# Patient Record
Sex: Female | Born: 1935 | Hispanic: Yes | Marital: Single | Smoking: Never smoker
Health system: Southern US, Community
[De-identification: ages and names within clinical notes are randomized; demographics above are authoritative.]

## PROBLEM LIST (undated history)

## (undated) DIAGNOSIS — K219 Gastro-esophageal reflux disease without esophagitis: Secondary | ICD-10-CM

## (undated) DIAGNOSIS — C3490 Malignant neoplasm of unspecified part of unspecified bronchus or lung: Secondary | ICD-10-CM

## (undated) DIAGNOSIS — I10 Essential (primary) hypertension: Secondary | ICD-10-CM

## (undated) DIAGNOSIS — C189 Malignant neoplasm of colon, unspecified: Secondary | ICD-10-CM

## (undated) DIAGNOSIS — K56609 Unspecified intestinal obstruction, unspecified as to partial versus complete obstruction: Secondary | ICD-10-CM

## (undated) HISTORY — PX: COLON SURGERY: SHX602

## (undated) HISTORY — DX: Gastro-esophageal reflux disease without esophagitis: K21.9

## (undated) HISTORY — PX: COLOSTOMY: SHX63

## (undated) HISTORY — PX: OTHER SURGICAL HISTORY: SHX169

## (undated) HISTORY — DX: Unspecified intestinal obstruction, unspecified as to partial versus complete obstruction: K56.609

---

## 2009-10-23 ENCOUNTER — Emergency Department (HOSPITAL_COMMUNITY): Admission: EM | Admit: 2009-10-23 | Discharge: 2009-10-23 | Payer: Self-pay | Admitting: Family Medicine

## 2011-03-20 ENCOUNTER — Emergency Department (HOSPITAL_COMMUNITY)
Admission: EM | Admit: 2011-03-20 | Discharge: 2011-03-21 | Disposition: A | Discharge status: Home / Self Care | Payer: Medicaid Other | Attending: Emergency Medicine | Admitting: Emergency Medicine

## 2011-03-20 DIAGNOSIS — Z85038 Personal history of other malignant neoplasm of large intestine: Secondary | ICD-10-CM | POA: Insufficient documentation

## 2011-03-20 DIAGNOSIS — I10 Essential (primary) hypertension: Secondary | ICD-10-CM | POA: Insufficient documentation

## 2011-03-20 DIAGNOSIS — R42 Dizziness and giddiness: Secondary | ICD-10-CM | POA: Insufficient documentation

## 2011-03-21 ENCOUNTER — Inpatient Hospital Stay (INDEPENDENT_AMBULATORY_CARE_PROVIDER_SITE_OTHER)
Admission: RE | Admit: 2011-03-21 | Discharge: 2011-03-21 | Disposition: A | Discharge status: Home / Self Care | Payer: Medicaid Other | Source: Ambulatory Visit | Attending: Family Medicine | Admitting: Family Medicine

## 2011-03-21 ENCOUNTER — Encounter (HOSPITAL_COMMUNITY): Payer: Self-pay | Admitting: Radiology

## 2011-03-21 ENCOUNTER — Emergency Department (HOSPITAL_COMMUNITY): Payer: Medicaid Other

## 2011-03-21 ENCOUNTER — Observation Stay (HOSPITAL_COMMUNITY)
Admission: EM | Admit: 2011-03-21 | Discharge: 2011-03-22 | Disposition: A | Discharge status: Home / Self Care | Payer: Medicaid Other | Attending: Internal Medicine | Admitting: Internal Medicine

## 2011-03-21 DIAGNOSIS — R110 Nausea: Secondary | ICD-10-CM | POA: Insufficient documentation

## 2011-03-21 DIAGNOSIS — R51 Headache: Secondary | ICD-10-CM | POA: Insufficient documentation

## 2011-03-21 DIAGNOSIS — H814 Vertigo of central origin: Secondary | ICD-10-CM

## 2011-03-21 DIAGNOSIS — R42 Dizziness and giddiness: Principal | ICD-10-CM | POA: Insufficient documentation

## 2011-03-21 DIAGNOSIS — I10 Essential (primary) hypertension: Secondary | ICD-10-CM | POA: Insufficient documentation

## 2011-03-21 DIAGNOSIS — R4789 Other speech disturbances: Secondary | ICD-10-CM | POA: Insufficient documentation

## 2011-03-21 DIAGNOSIS — Z85038 Personal history of other malignant neoplasm of large intestine: Secondary | ICD-10-CM | POA: Insufficient documentation

## 2011-03-21 HISTORY — DX: Essential (primary) hypertension: I10

## 2011-03-21 HISTORY — DX: Malignant neoplasm of colon, unspecified: C18.9

## 2011-03-21 LAB — CBC
MCH: 30.1 pg (ref 26.0–34.0)
MCHC: 35.5 g/dL (ref 30.0–36.0)
Platelets: 256 10*3/uL (ref 150–400)
RBC: 4.38 MIL/uL (ref 3.87–5.11)
RDW: 13.2 % (ref 11.5–15.5)
WBC: 6.6 10*3/uL (ref 4.0–10.5)
WBC: 8.6 10*3/uL (ref 4.0–10.5)

## 2011-03-21 LAB — DIFFERENTIAL
Basophils Relative: 0 % (ref 0–1)
Basophils Relative: 0 % (ref 0–1)
Eosinophils Absolute: 0.1 10*3/uL (ref 0.0–0.7)
Eosinophils Absolute: 0.1 10*3/uL (ref 0.0–0.7)
Eosinophils Relative: 1 % (ref 0–5)
Eosinophils Relative: 1 % (ref 0–5)
Lymphocytes Relative: 17 % (ref 12–46)
Lymphs Abs: 0.8 10*3/uL (ref 0.7–4.0)
Lymphs Abs: 1.1 10*3/uL (ref 0.7–4.0)
Monocytes Relative: 5 % (ref 3–12)
Monocytes Relative: 5 % (ref 3–12)
Neutro Abs: 5.1 10*3/uL (ref 1.7–7.7)
Neutro Abs: 7.2 10*3/uL (ref 1.7–7.7)

## 2011-03-21 LAB — URINALYSIS, ROUTINE W REFLEX MICROSCOPIC
Glucose, UA: NEGATIVE mg/dL
Hgb urine dipstick: NEGATIVE
Ketones, ur: NEGATIVE mg/dL
Protein, ur: NEGATIVE mg/dL
Urobilinogen, UA: 0.2 mg/dL (ref 0.0–1.0)
pH: 7.5 (ref 5.0–8.0)

## 2011-03-21 LAB — BASIC METABOLIC PANEL
BUN: 11 mg/dL (ref 6–23)
CO2: 28 mEq/L (ref 19–32)
Chloride: 101 mEq/L (ref 96–112)
Chloride: 104 mEq/L (ref 96–112)
Creatinine, Ser: 0.66 mg/dL (ref 0.50–1.10)
GFR calc non Af Amer: >60 mL/min (ref >60)
Glucose, Bld: 180 mg/dL — ABNORMAL HIGH (ref 70–99)
Potassium: 3.8 mEq/L (ref 3.5–5.1)
Sodium: 139 mEq/L (ref 135–145)
Sodium: 141 mEq/L (ref 135–145)

## 2011-03-22 ENCOUNTER — Observation Stay (HOSPITAL_COMMUNITY): Payer: Medicaid Other

## 2011-03-23 NOTE — H&P (Signed)
  NAMEAIYA, KEACH NO.:  [id number]  MEDICAL RECORD NO.:  [id number]  LOCATION:                                 FACILITY:  PHYSICIAN:  Houston Siren, MD           DATE OF BIRTH:  1928-09-07  DATE OF ADMISSION: DATE OF DISCHARGE:                             HISTORY & PHYSICAL   PRIMARY CARE PHYSICIAN:  None.  REASON FOR ADMISSION:  Vertigo.  ADVANCE DIRECTIVE:  Full code.  HISTORY OF PRESENT ILLNESS:  This is an 77 year old Hispanic female with benign past medical history on no chronic medication, presents with 2- day history of vertigo and occasional occipital headaches.  She has no visual problem and according to her daughter, there was a transient slurred speech this morning as well.  It had resolved spontaneously. She denied any focal weakness.  She had some nausea yesterday but none today.  There has been no paresthesia, facial droop, nausea, vomiting, fever, or chills.  She did admit that the vertigo correlates a little bit with her head movement.  Evaluation in the emergency room included a negative head CT,a normal CBC, a negative urinalysis, and normal electrolytes with blood glucose of 106, creatinine 0.6.  Because of the concern that this might be central vertigo, hospitalist was asked to admit her for further workup.  PAST MEDICAL HISTORY:  Benign.  Colon cancer and hypertension.  MEDICATION:  On no chronic meds.  ALLERGIES:  No known drug allergies.  SOCIAL HISTORY:  She denied tobacco, alcohol, or drug use.  PHYSICAL EXAMINATION:  VITAL SIGNS:  Blood pressure 143/76, pulse is 66, respiratory rate 16, temperature 98.7 GENERAL:  She is alert and oriented, is in no apparent distress.  The speech is fluent. HEENT:  Tongue is midline.  She has facial symmetry.  Funduscopic exam is benign.  Pupils equal, round, reactive to light. NECK:  Supple.  No carotid bruit. CARDIAC:  S1, S2 regular. LUNGS:  Clear. ABDOMEN:  Soft, nondistended,  nontender. EXTREMITIES:  No edema.  Good distal pulses bilaterally. NEUROLOGIC:  Cerebellar exam is negative.  Strength equal. PSYCHIATRIC:  Unremarkable as well.  OBJECTIVE FINDINGS:  EKG:  Sinus rhythm at 58.  No acute ST-T changes. CT scan of the head is negative.  IMPRESSION:  This is an 77 year old female, presents with vertigo that is positional and is improving.  She also has some nausea, and I suspect that this is likely peripheral vertigo.  Having said that, the nausea is mild, and the fact that she had slurred speech is unusual.  I believe that it is reasonable to get an MRI, MRA of her brain to exclude central vertigo i.e. vertebral basilar insufficiency.  We will treat her with an aspirin, and we will use benzodiazepines to treat her symptoms.  She is otherwise stable and will be admitted to [facility] 1.  She is a full code.     Houston Siren, MD     PL/MEDQ  D:  03/21/2011  T:  03/21/2011  Job:  [id number]  Electronically Signed by Houston Siren  on 03/23/2011 01:42:27 AM

## 2011-04-11 NOTE — Discharge Summary (Signed)
Ashley Green, Ashley Green                ACCOUNT NO.:  [id number]  MEDICAL RECORD NO.:  [id number]  LOCATION:  3705                         FACILITY:  [facility]  PHYSICIAN:  Calvert Cantor, M.D.     DATE OF BIRTH:  [birth year]  DATE OF ADMISSION:  03/21/2011 DATE OF DISCHARGE:  03/22/2011                              DISCHARGE SUMMARY   PRIMARY CARE PHYSICIAN:  None.  PRESENTING COMPLAINT:  Dizziness.  HISTORY OF PRESENT ILLNESS:  This is an 77 year old female with no significant past medical history who came in with a complaint of 2 days of dizziness and occasional headaches in the back of her head. Apparently, there was also transient episode of slurred speech the morning of admission, all this resolved completely.  She did not have any further new neurological symptoms.  Neurological exam in the ER no abnormal findings were noted.  It was suspected that the patient's dizziness may be coming from either labyrinthitis or BPPV.  However, an MRI and MRA were done to rule out CVA.  An MRI, MRA performed March 22, 2011, revealed no acute infarct.  She has mild to slightly moderate small vessel disease-type ischemic changes. Also noted is a moderate focal stenosis in proximal A1 segment right anterior cerebral artery.  Middle cerebral artery and anterior cerebral artery reveal mild branch irregularity.  There is an ectatic dominant right vertebral artery.  There is nonvisualization of the right PICA, there is a small irregular appearance of the left vertebral artery.  It is suspected  there superimposed atherosclerotic changes present there nonvisualization of left PICA.  There was an ectatic basilar artery without high-grade stenosis.  There is fetal origin of the right posterior cerebral artery.  The patient also had a CT of her head in the ER without contrast which did not reveal any acute abnormality.  On questioning today, the patient states that her dizziness has resolved completely.   She has been walking back and forth to the bathroom without any trouble.  Upon ambulating in the hall the patient has not noted to have any dizziness or any gait disturbance.  Due to the patient's atherosclerosis noted on MRI, she is recommended to start a baby aspirin daily.  She is also advised to find a primary care physician to have a cholesterol panel checked.  If elevated, she should be on a statin.  DISCHARGE MEDICATIONS: 1. Aspirin 81 mg daily. 2. Meclizine 12.5 mg every 8 hours as needed for any further     dizziness.  FOLLOWUP INSTRUCTIONS:  Find a PCP and have basic blood work done.  PHYSICAL EXAMINATION:  NEUROLOGIC:  Cranial nerves II through XII intact.  Strength intact in all four extremities.  No slurred speech noted. HEART:  Regular rate and rhythm.  No murmurs. LUNGS:  Clear bilaterally.  Normal respiratory effort.  No use of accessory muscles. ABDOMEN:  Soft, nontender, nondistended.  Bowel sounds positive. EXTREMITIES:  No cyanosis, clubbing or edema.  Pedal pulse is positive.  CONDITION ON DISCHARGE:  Stable.  TIME ON DISCHARGE AND PATIENT CARE:  45 minutes.     Calvert Cantor, M.D.     SR/MEDQ  D:  03/22/2011  T:  03/23/2011  Job:  [id number]  Electronically Signed by Calvert Cantor M.D. on 04/11/2011 01:28:08 PM

## 2012-07-31 ENCOUNTER — Ambulatory Visit (INDEPENDENT_AMBULATORY_CARE_PROVIDER_SITE_OTHER): Payer: Medicaid Other | Admitting: Family Medicine

## 2012-07-31 ENCOUNTER — Encounter: Payer: Self-pay | Admitting: Family Medicine

## 2012-07-31 VITALS — BP 150/88 | HR 71 | Temp 97.6°F | Wt 110.0 lb

## 2012-07-31 DIAGNOSIS — I10 Essential (primary) hypertension: Secondary | ICD-10-CM

## 2012-07-31 DIAGNOSIS — Z85038 Personal history of other malignant neoplasm of large intestine: Secondary | ICD-10-CM

## 2012-07-31 LAB — COMPREHENSIVE METABOLIC PANEL
Calcium: 10.3 mg/dL (ref 8.4–10.5)
Chloride: 103 mEq/L (ref 96–112)
Sodium: 140 mEq/L (ref 135–145)

## 2012-07-31 LAB — LIPID PANEL
Cholesterol: 279 mg/dL — ABNORMAL HIGH (ref 0–200)
HDL: 70 mg/dL (ref >39)
Total CHOL/HDL Ratio: 4 Ratio
Triglycerides: 89 mg/dL (ref <150)

## 2012-07-31 LAB — CBC
MCH: 29.5 pg (ref 26.0–34.0)
MCHC: 34.7 g/dL (ref 30.0–36.0)
Platelets: 306 10*3/uL (ref 150–400)
RDW: 15 % (ref 11.5–15.5)
WBC: 6.1 10*3/uL (ref 4.0–10.5)

## 2012-07-31 NOTE — Progress Notes (Signed)
Subjective:     Patient ID: Ashley Green, female   DOB: August 22, 1928, 78 y.o.   MRN: [id number]  HPI Ashley Green is a 78 yo female with a past medical history of colon cancer s/p colectomy with colostomy placed, presenting to the office to establish care. Besides going to the ED once last year, she has not seen a primary doctor in 4 years. She c/o dry throat with intense thirst and polyuria but not frequency or dysuria for months.  She also has a tic that she has had for years, in which her right eye winks spontaneously also complains of right eye with dry sensation. She denies any other issues. She is currently changing and taking care of her colostomy bag herself. She is also staying busy, working in her [employer].   Review of Systems See HPI.    Objective:   Physical Exam BP 160/88  Pulse 71  Temp 97.6 F (36.4 C) (Oral)  Wt 110 lb (49.896 kg) General appearance: alert, cooperative and no distress HENT Normocephalic, without obvious abnormality Eyes: Conjunctivae and sclerae normal, EOM intact. PERRL. Moist mucosa.  Lungs: clear to auscultation bilaterally, no wheezing, rales, rhonchi Heart: regular rate and rhythm, S1, S2 normal, no murmur, click, rub or gallop Abdomen: soft, non tender or distended. normal BS, colostomy bag empty, clean and intact, no erythema or drainage of skin surrounding bag Extremities: extremities normal, atraumatic, no cyanosis or edema Pulses: 2+ and symmetric Skin: Skin color, texture, turgor normal. No rashes or lesions Neuro: Oriented x3. No apparent neurologic focalization. CN II-XII intact.  Psych: Normal mood and affect. Normal speech. SIGE CAPS negative.    PGY-2 Addendum. I have seen and examined this pt in conjunction with MS and agree with her assessment and plan. I also agree with above documentation and my observations are added in bold. Complete assessment and plan will be under each problem as below.  Assessment and Plan:

## 2012-07-31 NOTE — Assessment & Plan Note (Signed)
Reports taking HCTZ at a "low dose"  (?). No medical care for 4 years. Also reports polydipsia and polyuria without frequency or dysuria. Plan: - BP log and bring it to next appointment with medications she is currently taking - CBC, CMET, Lipid profile. If elevated glucose will consider A1C. - F/u in 4-6 weeks.

## 2012-07-31 NOTE — Patient Instructions (Addendum)
Ha sido un placer verle hoy. Por favor registre sus presiones y anotelas. Traiga esos resultados a su proxima cita, asi como todal las medicinas que esta tomando. Yo la llamo si los resultados de laboratorios que se le van a hacer hoy salen alterados. Haga una cita en 4-6 semanas.

## 2012-09-07 ENCOUNTER — Ambulatory Visit: Payer: Medicaid Other | Admitting: Family Medicine

## 2012-10-15 ENCOUNTER — Ambulatory Visit (INDEPENDENT_AMBULATORY_CARE_PROVIDER_SITE_OTHER): Payer: Medicaid Other | Admitting: Family Medicine

## 2012-10-15 ENCOUNTER — Encounter: Payer: Self-pay | Admitting: Family Medicine

## 2012-10-15 VITALS — BP 132/88 | HR 74 | Temp 98.1°F | Ht 59.0 in | Wt 109.1 lb

## 2012-10-15 DIAGNOSIS — I10 Essential (primary) hypertension: Secondary | ICD-10-CM

## 2012-10-15 DIAGNOSIS — K219 Gastro-esophageal reflux disease without esophagitis: Secondary | ICD-10-CM

## 2012-10-15 DIAGNOSIS — E785 Hyperlipidemia, unspecified: Secondary | ICD-10-CM

## 2012-10-15 HISTORY — DX: Gastro-esophageal reflux disease without esophagitis: K21.9

## 2012-10-15 MED ORDER — ATORVASTATIN CALCIUM 40 MG PO TABS: 40.0000 mg | ORAL_TABLET | Freq: Every day | ORAL | Status: DC

## 2012-10-15 MED ORDER — OMEPRAZOLE 40 MG PO CPDR: 40.0000 mg | DELAYED_RELEASE_CAPSULE | Freq: Every day | ORAL | Status: AC

## 2012-10-15 MED ORDER — LISINOPRIL 10 MG PO TABS: 10.0000 mg | ORAL_TABLET | Freq: Every day | ORAL | Status: DC

## 2012-10-15 NOTE — Patient Instructions (Addendum)
Hipercolesterolemia Colesterol en sangre elevado (Hypercholesterolemia, High Blood Cholesterol) Tiene el colesterol en sangre alto. El resultado de la prueba de colesterol en sangre de hoy es _________________ mg/dL. El colesterol es una molcula de grasa normal necesaria para su cuerpo. Una cifra entre 200 y 240mg /dl est por encima de lo normal. Es un factor de alto riesgo para enfermedad coronaria y ataques cardacos. El colesterol suele medirse en HDL (Lipoprotenas de alta densidad). La lipoprotena de alta densidad es el colesterol "bueno". Un alto nivel de HDL es bueno para usted. Disminuye el riesgo de sufrir ataques cardacos. Tambin se miden las LDL (Lipoprotena de baja densidad). Una cifra de LDL de entre 120 y -160 mg/dl es malo para usted. La mitad de [employer] tienen ataques cardacos o infartos tienen altos niveles de [place].  Bajar el colesterol alto reduce el riesgo de tener un ataque cardaco. OTRAS MEDIDAS QUE REDUCEN LOS ATAQUES CARDACOS PUEDEN INCLUIR:  Mantener el peso normal.  Abandonar el hbito de fumar.  Tomar una aspirina por da. Si tiene [employer], haga esto slo si el mdico lo aprueba.  Consuma alcohol a niveles aceptables.  Una dieta con bajo contenido de grasas y colesterol.  Realizar actividad fsica con regularidad.  Los medicamentos para Copy colesterol pueden ser tiles si ha sido sugeridas por el mdico. El profesional comentar con usted el tipo de tratamiento a Design[employer]ery. Concerte la realizacin de otro anlisis como se le ha indicado. EST SEGURO QUE:   Comprende las instrucciones para el alta mdica.  Controlar su enfermedad.  Solicitar atencin mdica de inmediato segn las indicaciones. Document Released: 09/09/2005 Document Revised: 12/02/2011 [facility] Patient Information 2013 [place], [place].

## 2012-10-16 MED ORDER — ASPIRIN 81 MG PO TABS: 81.0000 mg | ORAL_TABLET | Freq: Every day | ORAL | Status: DC

## 2012-10-16 NOTE — Assessment & Plan Note (Signed)
Takes Omeprazole for symptom control.

## 2012-10-16 NOTE — Assessment & Plan Note (Signed)
LDL 191. Goal for pt 160.  Plan: Discussed start on statin therapy and pt agreeable.  Will recheck in 6 months.

## 2012-10-16 NOTE — Progress Notes (Signed)
Family Medicine Office Visit Note   Subjective:   Patient ID: Ashley Green, female  DOB: 1928/07/29, 79 y.o.. MRN: [id number]   Pt that comes today to discuss recent labs and for refill on her meds. She comes accompanied by daughter and denies any current complaints. She continues to keep herself active working on a [employer].   Review of Systems:  Pt denies SOB, chest pain, palpitations, headaches, dizziness, numbness or weakness. No changes on urinary or BM habits. No unintentional weigh loss/gain.  Objective:   Physical Exam: Gen:  NAD HEENT: Moist mucous membranes CV: Regular rate and rhythm, no murmurs rubs or gallops PULM: Clear to auscultation bilaterally. No wheezes/rales/rhonchi ABD: Soft, non tender, non distended, normal bowel sounds. Colostomy bag present with no leaks or erythema on surrounded skin. EXT: No edema Neuro: Alert and oriented x3. No focalization  Assessment & Plan:

## 2012-10-16 NOTE — Assessment & Plan Note (Signed)
BP controlled on Lisinopril 40 mg.  Continue treatment and recommended ASA 81 mg daily.

## 2013-02-24 ENCOUNTER — Ambulatory Visit (HOSPITAL_COMMUNITY)
Admission: RE | Admit: 2013-02-24 | Discharge: 2013-02-24 | Disposition: A | Discharge status: Home / Self Care | Payer: Medicaid Other | Source: Ambulatory Visit | Attending: Family Medicine | Admitting: Family Medicine

## 2013-02-24 ENCOUNTER — Ambulatory Visit (INDEPENDENT_AMBULATORY_CARE_PROVIDER_SITE_OTHER): Payer: Medicaid Other | Admitting: Family Medicine

## 2013-02-24 ENCOUNTER — Encounter: Payer: Self-pay | Admitting: Family Medicine

## 2013-02-24 VITALS — BP 132/50 | HR 67 | Temp 99.6°F | Ht 59.0 in | Wt 107.3 lb

## 2013-02-24 DIAGNOSIS — R42 Dizziness and giddiness: Secondary | ICD-10-CM

## 2013-02-24 DIAGNOSIS — Z85038 Personal history of other malignant neoplasm of large intestine: Secondary | ICD-10-CM

## 2013-02-24 DIAGNOSIS — R9431 Abnormal electrocardiogram [ECG] [EKG]: Secondary | ICD-10-CM | POA: Insufficient documentation

## 2013-02-24 DIAGNOSIS — I10 Essential (primary) hypertension: Secondary | ICD-10-CM

## 2013-02-24 MED ORDER — NATURA STOMAHESIVE MOLDABLE WAFR: 1.0000 | WAFER | Freq: Two times a day (BID) | Status: DC

## 2013-02-24 NOTE — Progress Notes (Signed)
Family Medicine Office Visit Note   Subjective:   Patient ID: Ashley Green, female  DOB: 10/28/1927, 79 y.o.. MRN: [id number]   Pt that comes today complaining of dizziness when changing position from sitting to standing and from lying to sitting. She is taking 10 mg of Lisinopril and reports compliance.  Her BP today is 132/50 and same manual. Pt also reports palpitation with this dizzy spells, but denies chest pain or SOB. No orthopnea or leg swelling.  Her daughter mentions to [place] that she saw her one time in the morning bluish decoloration on  left posterior aspect of the tongue and lower lip that self resolved. No other episodes has been witnessed. Pt denies any symptoms when this happened.  Review of Systems:  Pt denies headaches, numbness or weakness. No changes on urinary or BM habits. No unintentional weigh loss/gain.  Objective:   Physical Exam: Gen: NAD  HEENT: Moist mucous membranes  CV: Regular rate and rhythm, no murmurs rubs or gallops  PULM: Clear to auscultation bilaterally. No wheezes/rales/rhonchi  ABD: Soft, non tender, non distended, normal bowel sounds. Colostomy bag present with no leaks or erythema on surrounded skin.  EXT: No edema  Neuro: Alert and oriented x3. No focalization  Assessment & Plan:

## 2013-02-24 NOTE — Patient Instructions (Addendum)
Pare de tomar el Lisinopril. Chequee su presion y sitien cifras por encima de 140/90 tome la mitad de la tableta. Por favor escriba en un papel los valores de presion obtenidos. Ashley Green cite en 2-3 semanas para re-evaluar su condicion.

## 2013-02-24 NOTE — Assessment & Plan Note (Signed)
Colostomy bags ordered. 

## 2013-02-24 NOTE — Assessment & Plan Note (Addendum)
Very low diastolic blood pressure on automatic and when rechecked manually.  Possible orthostatism can explain this dizziness. No murmur on physical exam. EKG with T abnormalities only on one lead, no ST changes. The episode of reported cyanosis does not seem clinically correlated to central cyanosis but still a concern. P/ Stop lisinopril (pt was only on 10 mg daily) BP log If symptoms continue will consider Holter Monitor and ECHO. F/u in 2-3 weeks.

## 2013-03-01 ENCOUNTER — Telehealth: Payer: Self-pay | Admitting: Family Medicine

## 2013-03-01 NOTE — Telephone Encounter (Signed)
FYI:  I also called [facility] and they are unable to order that size ostomy wafer.  Jakoby Melendrez, Darlyne Russian, CMA

## 2013-03-01 NOTE — Telephone Encounter (Signed)
The patient daughter is calling because [facility] informed her that they cannot supply the Ostomy supplies and that Dr. Aviva Signs will need to contact a Medical Supply Company.

## 2013-03-01 NOTE — Telephone Encounter (Signed)
Spoke with [facility] and they do not carry the Ostomy supplies.  They recommended trying [facility].  I called them and they do not carry that size.  Will fwd to PCP to request she print out Rx and patient can call and find a place that carries that size.  Imari Reen, Darlyne Russian, CMA

## 2013-03-02 NOTE — Telephone Encounter (Signed)
FYI:  Ashley [facility] does not carry ostomy supplies.  I called [facility] [phone]) and they suggested patient come to the store and they will help her figure out what she needs in the brand that they carry.  I notified patients daughter and once all is determined, if needed we can write a prescription for exactly what supply her mother needs at [employer] [employer].  Kierra Jezewski, Darlyne Russian, CMA

## 2013-07-19 ENCOUNTER — Encounter (HOSPITAL_COMMUNITY): Payer: Self-pay | Admitting: Emergency Medicine

## 2013-07-19 DIAGNOSIS — Z85038 Personal history of other malignant neoplasm of large intestine: Secondary | ICD-10-CM | POA: Insufficient documentation

## 2013-07-19 DIAGNOSIS — Z933 Colostomy status: Secondary | ICD-10-CM | POA: Insufficient documentation

## 2013-07-19 DIAGNOSIS — R1032 Left lower quadrant pain: Secondary | ICD-10-CM | POA: Insufficient documentation

## 2013-07-19 DIAGNOSIS — Z79899 Other long term (current) drug therapy: Secondary | ICD-10-CM | POA: Insufficient documentation

## 2013-07-19 DIAGNOSIS — Z7982 Long term (current) use of aspirin: Secondary | ICD-10-CM | POA: Insufficient documentation

## 2013-07-19 DIAGNOSIS — I10 Essential (primary) hypertension: Secondary | ICD-10-CM | POA: Insufficient documentation

## 2013-07-19 LAB — CBC WITH DIFFERENTIAL/PLATELET
Basophils Absolute: 0 10*3/uL (ref 0.0–0.1)
HCT: 36.9 % (ref 36.0–46.0)
Hemoglobin: 13.1 g/dL (ref 12.0–15.0)
Lymphocytes Relative: 7 % — ABNORMAL LOW (ref 12–46)
Lymphs Abs: 0.7 10*3/uL (ref 0.7–4.0)
Monocytes Relative: 5 % (ref 3–12)
Neutro Abs: 9 10*3/uL — ABNORMAL HIGH (ref 1.7–7.7)
Neutrophils Relative %: 87 % — ABNORMAL HIGH (ref 43–77)
Platelets: 299 10*3/uL (ref 150–400)
RBC: 4.31 MIL/uL (ref 3.87–5.11)
RDW: 13 % (ref 11.5–15.5)

## 2013-07-19 LAB — URINE MICROSCOPIC-ADD ON

## 2013-07-19 LAB — COMPREHENSIVE METABOLIC PANEL
ALT: 12 U/L (ref 0–35)
Alkaline Phosphatase: 73 U/L (ref 39–117)
CO2: 27 mEq/L (ref 19–32)
GFR calc Af Amer: >90 mL/min (ref >90)
Glucose, Bld: 140 mg/dL — ABNORMAL HIGH (ref 70–99)

## 2013-07-19 LAB — URINALYSIS, ROUTINE W REFLEX MICROSCOPIC
Glucose, UA: NEGATIVE mg/dL
Nitrite: NEGATIVE
Protein, ur: NEGATIVE mg/dL
Specific Gravity, Urine: 1.008 (ref 1.005–1.030)
pH: 6 (ref 5.0–8.0)

## 2013-07-19 NOTE — ED Notes (Addendum)
Pt. reports intermittent LLQ pain onset yesterday , denies nausea or vomitting , no fever or chills. Normal bowel movent at colostomy. No pain at triage.

## 2013-07-20 ENCOUNTER — Ambulatory Visit (INDEPENDENT_AMBULATORY_CARE_PROVIDER_SITE_OTHER): Payer: Medicaid Other | Admitting: Sports Medicine

## 2013-07-20 ENCOUNTER — Emergency Department (HOSPITAL_COMMUNITY)
Admission: EM | Admit: 2013-07-20 | Discharge: 2013-07-20 | Discharge status: Against Medical Advice | Payer: Medicaid Other | Attending: Emergency Medicine | Admitting: Emergency Medicine

## 2013-07-20 ENCOUNTER — Encounter: Payer: Self-pay | Admitting: Sports Medicine

## 2013-07-20 VITALS — BP 119/43 | HR 68 | Temp 99.3°F | Ht 60.0 in | Wt 102.0 lb

## 2013-07-20 DIAGNOSIS — R109 Unspecified abdominal pain: Secondary | ICD-10-CM

## 2013-07-20 LAB — POCT URINALYSIS DIPSTICK
Bilirubin, UA: NEGATIVE
Blood, UA: NEGATIVE
Leukocytes, UA: NEGATIVE
Protein, UA: NEGATIVE
Spec Grav, UA: 1.01
Urobilinogen, UA: 0.2

## 2013-07-20 MED ORDER — SENNA 8.6 MG PO TABS: 1.0000 | ORAL_TABLET | Freq: Three times a day (TID) | ORAL | Status: DC | PRN

## 2013-07-20 MED ORDER — POLYETHYLENE GLYCOL 3350 17 GM/SCOOP PO POWD: 17.0000 g | Freq: Two times a day (BID) | ORAL | Status: DC

## 2013-07-20 NOTE — ED Notes (Signed)
Pt left to she her PCP. I let her know she was next to get room, but she would not stay

## 2013-07-20 NOTE — Patient Instructions (Signed)
   Miralax 2 Scoops 2 Times a day for 2 days.  Drink Gatorade as well with this  Take Senna every 8 hours until a large BM occurs  Follow up with Dr.  Aviva Signs ASAP  We are sending an Ostomy Nurse to your house   If you need anything prior to your next visit please call the clinic. Please Bring all medications or accurate medication list with you to each appointment; an accurate medication list is essential in providing you the best care possible.

## 2013-07-20 NOTE — Progress Notes (Signed)
[facility] Ashley Green - 79 y.o. female MRN [id number]  Date of birth: [birth year]  CC, HPI, INTERVAL HISTORY & ROS  Ashley Green is here today for evaluation of diffuse abdominal pain.    She reports a 2 week insidious onset of abdominal discomfort and colicky type pain.  She is here with her daughter who interprets for her.  The patient and her daughter presented to the [facility] emergency department last night due to the symptoms however left prior to being seen because of spontaneous improvement.  Urinalysis at that time showed moderate leukocytes, negative nitrites, and unremarkable CBC and CMET.  The patient is status post colectomy with colostomy for colon cancer.  She reports over the past 2 weeks she has had significantly decreased stool output.  She is only having a hard occasional small balls of stool from her colostomy.  They have had significant issues with obtaining appropriate colostomy supplies and currently there is only a colostomy bag over the site without appropriate gasket/colostomy seal.    The daughter believes her mother has been significantly restricting fluid and magnesium citrate due to being concerned about ostomy leakage  Pt denies any fevers, chills, or rigors.  Patient denies any dysuria, frequency, hesitancy, nocturia.  No nausea, vomiting  No melana/hematachezia  History  Past Medical, Surgical, Social, and Family History Reviewed per EMR Medications and Allergies reviewed and all updated if necessary. Objective Findings  VITALS: HR: 68 bpm  BP: 119/43 mmHg  TEMP: 99.3 F (37.4 C) (Oral)  RESP:    HT: 5' (152.4 cm)  WT: 102 lb (46.267 kg)  BMI: 20   BP Readings from Last 3 Encounters:  07/20/13 119/43  07/19/13 172/71  02/24/13 132/50   Wt Readings from Last 3 Encounters:  07/20/13 102 lb (46.267 kg)  07/19/13 104 lb 12.8 oz (47.537 kg)  02/24/13 107 lb 4.8 oz (48.671 kg)     PHYSICAL EXAM: GENERAL:  adult thin  Hispanic elderly female. In no discomfort; no respiratory distress  PSYCH: alert and appropriate, daughter is used as an interpreter per patient request   HNEENT:  moist mucous membranes,   CARDIO: RRR, S1/S2 heard, no murmur  LUNGS: CTA B, no wheezes, no crackles  ABDOMEN: soft, diffusely tender with voluntary guarding, no rebound, no rigidity, negative Murphy's, negative McBurney's, there is a well-healed ostomy that is pink and moist, there is a palpable stool burden diffusely and there is no output from the ostomy at this time even with manipulation during deep abdominal palpation, There is no suprapubic tenderness   EXTREM:  Warm, well perfused.  Moves all 4 extremities spontaneously; no lateralization.  No noted foot lesions.  Distal pulses 2+/4.  No pretibial edema.  GU:  deferred   SKIN:     Assessment & Plan   Problems addressed today: General Plan & Pt Instructions:  1. Abdominal pain, unspecified site       Miralax 2 Scoops 2 Times a day for 2 days.  Drink Gatorade as well with this  Take Senna every 8 hours until a large BM occurs  Follow up with Dr.  Aviva Signs ASAP  We are sending an Ostomy Nurse to your house     For further discussion of A/P and for follow up issues see problem based charting if applicable.

## 2013-07-20 NOTE — Assessment & Plan Note (Addendum)
Patient's history significant for colostomy secondary to colon cancer. She has had multiple issues with recurrent abdominal pain and noted to have constipation in the past. History is consistent with constipation again.  The likely bigger issue here is likely lack of colostomy supplies and desire to avoid seepage of stool.  She is apparently had restrictive behaviors to avoid loose stools and reports significantly hard balls of stool over the past couple of weeks.  She has a palpable stool burden on exam without focal findings of infection.   - Referral placed for home health for colostomy supplies and colostomy wound care.   - Start MiraLax twice a day.  Stimulant laxative for the next 2 days. Will likely need to continue MiraLax daily > Consider checking TSH if not improved at next visit > Consider Foley soap suds enema if persistent symptoms.  Consider further imaging  Urinalysis with leukocytes but no nitrites, no bacteria.  Will recheck today and send for culture.  I had a low suspicion for significant intra-abdominal pathology but have reviewed red flags with the daughter and patient for reevaluation in the emergency department.  Of concern could be a reoccurrence of her colon cancer and recommend the above.  According to national recommendations the patient should likely undergo a repeat colonoscopy and/or consider CEA screening.  I have asked them to follow up with her primary care physician [facility]) to discuss further options. > Consider referral to general surgery/gastroenterology for repeat colonoscopy given lack of followup of her prior colon cancer since being in [place].

## 2013-07-21 LAB — URINE CULTURE: Organism ID, Bacteria: NO GROWTH

## 2013-11-02 ENCOUNTER — Other Ambulatory Visit: Payer: Self-pay | Admitting: *Deleted

## 2013-11-02 DIAGNOSIS — I10 Essential (primary) hypertension: Secondary | ICD-10-CM

## 2013-11-02 MED ORDER — LISINOPRIL 10 MG PO TABS: 10.0000 mg | ORAL_TABLET | Freq: Every day | ORAL | Status: DC

## 2014-01-27 ENCOUNTER — Ambulatory Visit (INDEPENDENT_AMBULATORY_CARE_PROVIDER_SITE_OTHER): Payer: Medicaid Other | Admitting: Family Medicine

## 2014-01-27 ENCOUNTER — Encounter: Payer: Self-pay | Admitting: Family Medicine

## 2014-01-27 VITALS — BP 110/43 | HR 75 | Temp 98.1°F | Ht 60.0 in | Wt 104.0 lb

## 2014-01-27 DIAGNOSIS — I10 Essential (primary) hypertension: Secondary | ICD-10-CM

## 2014-01-27 DIAGNOSIS — R42 Dizziness and giddiness: Secondary | ICD-10-CM

## 2014-01-27 LAB — CBC WITH DIFFERENTIAL/PLATELET
BASOS ABS: 0 10*3/uL (ref 0.0–0.1)
Basophils Relative: 0 % (ref 0–1)
EOS PCT: 2 % (ref 0–5)
Eosinophils Absolute: 0.1 10*3/uL (ref 0.0–0.7)
HCT: 35.4 % — ABNORMAL LOW (ref 36.0–46.0)
Hemoglobin: 12.1 g/dL (ref 12.0–15.0)
LYMPHS ABS: 0.7 10*3/uL (ref 0.7–4.0)
Lymphocytes Relative: 15 % (ref 12–46)
MCH: 29.4 pg (ref 26.0–34.0)
MCHC: 34.2 g/dL (ref 30.0–36.0)
MCV: 86.1 fL (ref 78.0–100.0)
Monocytes Absolute: 0.4 10*3/uL (ref 0.1–1.0)
Monocytes Relative: 9 % (ref 3–12)
NEUTROS ABS: 3.3 10*3/uL (ref 1.7–7.7)
NEUTROS PCT: 74 % (ref 43–77)
PLATELETS: 284 10*3/uL (ref 150–400)
RBC: 4.11 MIL/uL (ref 3.87–5.11)
RDW: 14.5 % (ref 11.5–15.5)
WBC: 4.5 10*3/uL (ref 4.0–10.5)

## 2014-01-27 MED ORDER — LISINOPRIL 5 MG PO TABS: 5.0000 mg | ORAL_TABLET | Freq: Every day | ORAL | Status: DC

## 2014-01-27 NOTE — Progress Notes (Signed)
   Subjective:    Patient ID: Ashley Green, female    DOB: 12/01/1927, 80 y.o.   MRN: [id number]  HPI Ashley Green is a 80 y.o. Hispanic female presents to contact family medicine clinic today for complaints of dizziness.  Dizziness: Patient is accompanied by her daughter today which translates for me. Patient states that she has been dizzy for over a year with an associated right-sided headache which she describes as "heaviness ". Patient presented with similar symptoms in June of last year with negative workup. Patient does take lisinopril 10 mg for her blood pressure. Patient reports that her dizziness is worse when she stands up to walk and with quick movements. She states the room does not spin. It is worsened bilateral wheezes as well. It's better when she is sitting. She states that she drinks plenty of fluids. Does have a history of a colostomy which she states her output has been normal. Her blood pressure today in the office is 110/43. She does not take her blood pressures at home. She denies any chest pain, palpitations, syncope or near-syncope or dyspnea. She is mobile at home and does not need assistance.  Review of Systems Negative, with the exception of above mentioned in HPI     Objective:   Physical Exam BP 110/43  Pulse 75  Temp(Src) 98.1 F (36.7 C) (Oral)  Ht 5' (1.524 m)  Wt 104 lb (47.174 kg)  BMI 20.31 kg/m2 Gen: Well-developed, well-nourished Hispanic female, no acute distress and nontoxic in appearance HEENT: AT. [place]. Bilateral TM difficult to visualize do to extremely small external auditory meatus. A small portion of TM that could be visualized was normal. Bilateral eyes without injections or icterus. MMM. Bilateral nares without erythema or swelling. Throat without erythema or exudates.  CV: RRR, no murmurs clicks gallops or rubs Chest: CTAB, no wheeze or crackles Abd: Soft.  NTND. BS present. No Masses palpated.  Ext: No erythema. No edema.  Neuro: Normal gait.  PERLA. EOMi. Alert. Radial nerves II through XII grossly intact, no focal deficits. Muscle strength 5/5 bilateral upper extremity and 5/5 bilateral lower extremity.

## 2014-01-27 NOTE — Assessment & Plan Note (Signed)
Dizziness likely due to low blood pressures. Orthostatics were completed and negative today. She did admit that she didn't feel as dizzy today. Question if dehydration along with low blood pressures are not contributing together. Patient does have colostomy and by report doesn't seem to be taking in as much fluids, as she admited to have taken. Advised patient to take plenty of water a day, drinking at least 8 glasses a day Decrease lisinopril to 5 mg daily. Patient to monitor blood pressure is stable at home over the next 2 weeks. Could consider vestibular or PT rehabilitation, if patient does not respond to lower dose of lisinopril. She is to followup with her PCP in 2 weeks.

## 2014-01-27 NOTE — Patient Instructions (Signed)
Hipotensin (Hypotension) Cuando el corazn late, bombea la sangre a travs del cuerpo. La fuerza que origina es la presin arterial. Si sufre hipotensin, la presin arterial es baja. Cuando la presin arterial es demasiado baja, es posible que no llegue sangre suficiente al cerebro. Podr sentir debilidad, Tree surgeon, latidos cardacos acelerados o perder el conocimiento (se desmaya). CUIDADOS EN EL HOGAR  Beba gran cantidad de lquido para mantener el pis (orina) de tono claro o amarillo plido.  Tome los [employer] le indic el mdico.  Levntese lentamente luego de estar sentado o acostado.  Use medias de compresin segn las indicaciones de su mdico.  Siga una dieta saludable e incluya alimentos nutritivos como frutas, vegetales, nueces, granos enteros y carnes [place]. SOLICITE AYUDA SI:  Devuelve (vomita) o tiene deposiciones acuosas (diarrea).  Tiene fiebre por ms de 2 - 3 das.  Tiene ms sed que lo habitual.  Se siente dbil y cansado. SOLICITE AYUDA DE INMEDIATO SI:   Pierde el conocimiento (se desmaya).  Siente dolor en el pecho o latidos cardacos irregulares.  Pierde la sensibilidad en una parte de su cuerpo.  No puede mover los brazos o las piernas.  Tiene dificultad para hablar.  Se siente transpirado o sufre un desmayo. ASEGRESE DE QUE:   Comprende estas instrucciones.  Controlar su afeccin.  Recibir ayuda de inmediato si no mejora o si empeora. Document Released: 03/10/2012 Document Revised: 05/12/2013 [facility] Patient Information 2014 [place], [place].

## 2014-01-28 ENCOUNTER — Telehealth: Payer: Self-pay | Admitting: Family Medicine

## 2014-01-28 LAB — COMPLETE METABOLIC PANEL WITH GFR
ALT: 13 U/L (ref 0–35)
AST: 15 U/L (ref 0–37)
Albumin: 4.1 g/dL (ref 3.5–5.2)
Alkaline Phosphatase: 70 U/L (ref 39–117)
BUN: 14 mg/dL (ref 6–23)
CO2: 28 mEq/L (ref 19–32)
CREATININE: 0.76 mg/dL (ref 0.50–1.10)
Calcium: 9.5 mg/dL (ref 8.4–10.5)
Chloride: 104 mEq/L (ref 96–112)
GFR, EST AFRICAN AMERICAN: 84 mL/min
GFR, Est Non African American: 73 mL/min
Glucose, Bld: 89 mg/dL (ref 70–99)
POTASSIUM: 3.9 meq/L (ref 3.5–5.3)
Sodium: 140 mEq/L (ref 135–145)
TOTAL PROTEIN: 6.7 g/dL (ref 6.0–8.3)
Total Bilirubin: 0.3 mg/dL (ref 0.2–1.2)

## 2014-01-28 LAB — TSH: TSH: 3.644 u[IU]/mL (ref 0.350–4.500)

## 2014-01-28 NOTE — Telephone Encounter (Signed)
Please call pt and speak with her (spanish speaker) or [name]) and inform them Ashley Green's  Labs are normal and do not explain her dizziness. Please make sue she follows up with her PCP in 2 weeks for BP check and follow up. Thanks.

## 2014-02-09 ENCOUNTER — Ambulatory Visit: Payer: Medicaid Other | Admitting: Family Medicine

## 2014-11-28 ENCOUNTER — Telehealth: Payer: Self-pay | Admitting: *Deleted

## 2014-11-28 DIAGNOSIS — I10 Essential (primary) hypertension: Secondary | ICD-10-CM

## 2014-11-28 MED ORDER — LISINOPRIL 5 MG PO TABS: 5.0000 mg | ORAL_TABLET | Freq: Every day | ORAL | Status: DC

## 2014-11-28 NOTE — Telephone Encounter (Signed)
Prescription for 5mg  sent in. Dose of Lisinopril decreased from 10mg  to 5mg  at last office visit with Dr. Raoul Pitch.

## 2014-11-28 NOTE — Telephone Encounter (Signed)
Refill request from pharmacy for lisinopril 10mg  tablet to be taken once daily. (only 5mg  listed in chart)

## 2015-03-06 ENCOUNTER — Ambulatory Visit (INDEPENDENT_AMBULATORY_CARE_PROVIDER_SITE_OTHER): Payer: Medicaid Other | Admitting: Family Medicine

## 2015-03-06 ENCOUNTER — Encounter: Payer: Self-pay | Admitting: Family Medicine

## 2015-03-06 VITALS — BP 117/48 | HR 70 | Temp 98.4°F | Wt 97.2 lb

## 2015-03-06 DIAGNOSIS — I10 Essential (primary) hypertension: Secondary | ICD-10-CM

## 2015-03-06 NOTE — Assessment & Plan Note (Signed)
Over-treated on tiny dose of lisinopril causing orthostasis (which has been documented several times before). She has no compelling indications for lisinopril. This was discontinued and she is to follow up in 2-4 weeks weith PCP for physical including BP check.

## 2015-03-06 NOTE — Patient Instructions (Signed)
Please STOP taking lisinopril completely. This should help with dizziness.  Make an appointment with Dr. Gerlean Ren, your new primary care doctor, to get a physical.

## 2015-03-06 NOTE — Progress Notes (Signed)
Subjective: Artisha Capri is a 81 y.o. female patient of Dr. Johnathan Hausen presenting for dizziness.   She is accompanied by her English-speaking daughter who refuses an interpretor. Mrs. Fundora has experienced worsening dizziness (actually light-headedness, not room spinning) worst when rising from seating or laying position over the past several months. She has been taking lisinopril '5mg'$  (decreased from '10mg'$ ). She has not fallen.   - ROS: Denies syncope, CP, SOB, palpitations, diaphoresis, fevers, chills, weight loss, fatigue.  - Nonsmoker  Objective: BP 117/48 mmHg  Pulse 70  Temp(Src) 98.4 F (36.9 C) (Oral)  Wt 97 lb 3.2 oz (44.09 kg) Gen: Well-appearing elderly female in no distress Pulm: Non-labored, clear CV: Regular rate, no murmur, rub or gallop. No LE edema, no JVD  Assessment/Plan: Kajal Scalici is a 81 y.o. female here for hypotension.   See problem list for plan.

## 2015-03-06 NOTE — Progress Notes (Signed)
I was available as preceptor to resident for this patient's office visit.  

## 2015-03-15 ENCOUNTER — Encounter: Payer: Medicaid Other | Admitting: Family Medicine

## 2015-04-03 ENCOUNTER — Encounter: Payer: Self-pay | Admitting: Family Medicine

## 2015-04-03 ENCOUNTER — Ambulatory Visit (INDEPENDENT_AMBULATORY_CARE_PROVIDER_SITE_OTHER): Payer: Medicaid Other | Admitting: Family Medicine

## 2015-04-03 VITALS — BP 120/62 | HR 80 | Temp 98.5°F | Ht 60.0 in | Wt 95.0 lb

## 2015-04-03 DIAGNOSIS — M546 Pain in thoracic spine: Secondary | ICD-10-CM

## 2015-04-03 DIAGNOSIS — R1310 Dysphagia, unspecified: Secondary | ICD-10-CM

## 2015-04-03 MED ORDER — BACLOFEN 10 MG PO TABS: 5.0000 mg | ORAL_TABLET | Freq: Three times a day (TID) | ORAL | Status: DC

## 2015-04-03 NOTE — Progress Notes (Signed)
    Subjective   Ashley Green is a 81 y.o. female that presents for a same day visit  1. Back pain: Started 6 days ago. She states that she had lifted some branches. She has associated fatigue and decreased appetite. No recent URI symptoms.She has shortness of breath that started yesterday. She has no chest pain. No fevers. No coughing. Pain is worsening and she has not taken anything for the pain. Moving and breathing makes the pain worse. 2. Dysphagia: Symptoms started months ago. No trouble swallowing but food gets stuck in her throat. She states she has never seen a gastroenterologist.   ROS Per HPI  History  Substance Use Topics  . Smoking status: Never Smoker   . Smokeless tobacco: Not on file  . Alcohol Use: No    No Known Allergies  Objective   BP 120/62 mmHg  Pulse 80  Temp(Src) 98.5 F (36.9 C) (Oral)  Ht 5' (1.524 m)  Wt 95 lb (43.092 kg)  BMI 18.55 kg/m2, SpO2: 100%  General: Fair appearing, no distress Respiratory/Chest: Has episodes where she appears to be out of breath, which resolve. Clear to auscultation bilaterally with audible breath sounds in all lung fields. Cardiovascular: Regular rate and rhythm Musculoskeletal: Slight tenderness along medial edge of scapula, no midline tenderness  Assessment and Plan   Meds ordered this encounter  Medications  . baclofen (LIORESAL) 10 MG tablet    Sig: Take 0.5 tablets (5 mg total) by mouth 3 (three) times daily.    Dispense:  30 each    Refill:  0    Dysphagia  Will refer to GI  Back pain: most likely musculoskeletal.  Baclofen  Discussed red flags for symptoms of pneumothorax, pneumonia

## 2015-04-03 NOTE — Patient Instructions (Signed)
Thank you for coming to see me today. It was a pleasure. Today we talked about:   Back pain: I will prescribe a muscle relaxer to help with your pain. Take Tylenol as needed  Dysphagia (trouble swallowing): I will refer you to the GI doctor  If you have any questions or concerns, please do not hesitate to call the office at [phone].  Sincerely,  Cordelia Poche, MD

## 2015-04-04 ENCOUNTER — Encounter: Payer: Self-pay | Admitting: Physician Assistant

## 2015-04-11 ENCOUNTER — Ambulatory Visit (INDEPENDENT_AMBULATORY_CARE_PROVIDER_SITE_OTHER): Payer: Medicaid Other | Admitting: Family Medicine

## 2015-04-11 VITALS — BP 109/93 | HR 76 | Temp 99.6°F | Wt 95.7 lb

## 2015-04-11 DIAGNOSIS — Z85038 Personal history of other malignant neoplasm of large intestine: Secondary | ICD-10-CM

## 2015-04-11 DIAGNOSIS — M546 Pain in thoracic spine: Secondary | ICD-10-CM | POA: Diagnosis present

## 2015-04-11 NOTE — Patient Instructions (Signed)
Back Pain, Adult Low back pain is very common. About 1 in 5 people have back pain.The cause of low back pain is rarely dangerous. The pain often gets better over time.About half of people with a sudden onset of back pain feel better in just 2 weeks. About 8 in 10 people feel better by 6 weeks.  CAUSES Some common causes of back pain include:  Strain of the muscles or ligaments supporting the spine.  Wear and tear (degeneration) of the spinal discs.  Arthritis.  Direct injury to the back. DIAGNOSIS Most of the time, the direct cause of low back pain is not known.However, back pain can be treated effectively even when the exact cause of the pain is unknown.Answering your caregiver's questions about your overall health and symptoms is one of the most accurate ways to make sure the cause of your pain is not dangerous. If your caregiver needs more information, he or she may order lab work or imaging tests (X-rays or MRIs).However, even if imaging tests show changes in your back, this usually does not require surgery. HOME CARE INSTRUCTIONS For many people, back pain returns.Since low back pain is rarely dangerous, it is often a condition that people can learn to manageon their own.   Remain active. It is stressful on the back to sit or stand in one place. Do not sit, drive, or stand in one place for more than 30 minutes at a time. Take short walks on level surfaces as soon as pain allows.Try to increase the length of time you walk each day.  Do not stay in bed.Resting more than 1 or 2 days can delay your recovery.  Do not avoid exercise or work.Your body is made to move.It is not dangerous to be active, even though your back may hurt.Your back will likely heal faster if you return to being active before your pain is gone.  Pay attention to your body when you bend and lift. Many people have less discomfortwhen lifting if they bend their knees, keep the load close to their bodies,and  avoid twisting. Often, the most comfortable positions are those that put less stress on your recovering back.  Find a comfortable position to sleep. Use a firm mattress and lie on your side with your knees slightly bent. If you lie on your back, put a pillow under your knees.  Only take over-the-counter or prescription medicines as directed by your caregiver. Over-the-counter medicines to reduce pain and inflammation are often the most helpful.Your caregiver may prescribe muscle relaxant drugs.These medicines help dull your pain so you can more quickly return to your normal activities and healthy exercise.  Put ice on the injured area.  Put ice in a plastic bag.  Place a towel between your skin and the bag.  Leave the ice on for 15-20 minutes, 03-04 times a day for the first 2 to 3 days. After that, ice and heat may be alternated to reduce pain and spasms.  Ask your caregiver about trying back exercises and gentle massage. This may be of some benefit.  Avoid feeling anxious or stressed.Stress increases muscle tension and can worsen back pain.It is important to recognize when you are anxious or stressed and learn ways to manage it.Exercise is a great option. SEEK MEDICAL CARE IF:  You have pain that is not relieved with rest or medicine.  You have pain that does not improve in 1 week.  You have new symptoms.  You are generally not feeling well. SEEK   IMMEDIATE MEDICAL CARE IF:   You have pain that radiates from your back into your legs.  You develop new bowel or bladder control problems.  You have unusual weakness or numbness in your arms or legs.  You develop nausea or vomiting.  You develop abdominal pain.  You feel faint. Document Released: 09/09/2005 Document Revised: 03/10/2012 Document Reviewed: 01/11/2014 [employer] Patient Information 2015 [employer]. This information is not intended to replace advice given to you by your health care provider. Make sure you  discuss any questions you have with your health care provider.  

## 2015-04-12 NOTE — Assessment & Plan Note (Addendum)
-  Concern for possible metastasis to spine given history, age, waking from sleep at night, wegith loss, and palpable mass - Will obtain CMP, ESR, and CBC - Discussed with radiology. Recommend CT scan with contrast (following CMP to evaluate kidney function) of chest, abdomen, and pelvis. CT thoracic spine would not fully evaluate ass since it sees to extend out to border of scapula. Given history of colon cancer and concern that primary cancer is in abdomen, recommend scanning abdomen and pelvis at same time as chest. Orders placed for CT. - Given radiation of pain in distribution of T6-T8, suspect mass is compressing nerves. Recommended that if she notes worsening neurologic function, including numbness, weakness, or paralysis or trouble breathing, she needs to go to ED for further evaluation

## 2015-04-12 NOTE — Progress Notes (Signed)
Subjective:     Patient ID: Ashley Green, female   DOB: 05/05/1928, 81 y.o.   MRN: [id number]  HPI Mrs. Cherne is an 81yo female presenting today with worsening back pain. Visit conducted with aid of Spanish Interpretor.  - States pain started 10 days ago - Was prescribed Baclofen at last office visit with little relief - Has noticed a bump on her back that was not there previously - Pain wakes her up at night - Reports possible weight loss. Family states that they also believe she has lost weight over the last year - Initially had constipation. Took laxatives 8 days ago and has since had liquid stool in ostomy back. Has noted some blood in stool occasionally over last 8 days. - Pain wraps around left side of body - Denies saddle anesthesia, urinary incontinence - History of colon cancer   Chart Review:  Office Visit 04/03/15  Back pain x6 days after lifting some branches. Also reports dysphagia.  Prescription for Baclofen given. Referral to GI placed.  Colectomy in 2000 secondary to colon cancer  Review of Systems  Constitutional: Positive for unexpected weight change.  Gastrointestinal: Positive for diarrhea.  Musculoskeletal: Positive for back pain.       Objective:   Physical Exam  Constitutional: She is oriented to person, place, and time. She appears well-developed and well-nourished. No distress.  Cardiovascular: Normal rate and regular rhythm.  Exam reveals no gallop and no friction rub.   No murmur heard. Pulmonary/Chest: Effort normal and breath sounds normal. No respiratory distress. She has no wheezes. She has no rales.  Abdominal: Soft. Bowel sounds are normal. She exhibits no distension. There is no tenderness. There is no rebound.  Ostomy present  Musculoskeletal:       Arms: Neurological: She is alert and oriented to person, place, and time. No cranial nerve deficit.       Assessment:     Please refer to Problem List for Assessment.    Plan:     Please  refer to Problem List for Plan.

## 2015-04-17 ENCOUNTER — Telehealth: Payer: Self-pay | Admitting: Family Medicine

## 2015-04-17 ENCOUNTER — Other Ambulatory Visit: Payer: Medicaid Other

## 2015-04-17 DIAGNOSIS — M546 Pain in thoracic spine: Secondary | ICD-10-CM

## 2015-04-17 LAB — COMPREHENSIVE METABOLIC PANEL
ALBUMIN: 3.7 g/dL (ref 3.6–5.1)
ALK PHOS: 76 U/L (ref 33–130)
ALT: 12 U/L (ref 6–29)
AST: 11 U/L (ref 10–35)
BILIRUBIN TOTAL: 0.2 mg/dL (ref 0.2–1.2)
BUN: 17 mg/dL (ref 7–25)
CHLORIDE: 100 mmol/L (ref 98–110)
CO2: 26 mmol/L (ref 20–31)
Calcium: 9.3 mg/dL (ref 8.6–10.4)
Creat: 0.57 mg/dL — ABNORMAL LOW (ref 0.60–0.88)
Glucose, Bld: 93 mg/dL (ref 65–99)
Potassium: 4.6 mmol/L (ref 3.5–5.3)
SODIUM: 137 mmol/L (ref 135–146)
Total Protein: 6.6 g/dL (ref 6.1–8.1)

## 2015-04-17 LAB — CBC
HEMATOCRIT: 25.7 % — AB (ref 36.0–46.0)
HEMOGLOBIN: 8.6 g/dL — AB (ref 12.0–15.0)
MCH: 26.5 pg (ref 26.0–34.0)
MCHC: 33.5 g/dL (ref 30.0–36.0)
MCV: 79.3 fL (ref 78.0–100.0)
MPV: 8.3 fL — AB (ref 8.6–12.4)
Platelets: 595 10*3/uL — ABNORMAL HIGH (ref 150–400)
RBC: 3.24 MIL/uL — ABNORMAL LOW (ref 3.87–5.11)
RDW: 14.4 % (ref 11.5–15.5)
WBC: 8.4 10*3/uL (ref 4.0–10.5)

## 2015-04-17 LAB — SEDIMENTATION RATE: SED RATE: 76 mm/h — AB (ref 0–30)

## 2015-04-17 NOTE — Telephone Encounter (Signed)
Spoke to [employer].  Information has been sent to [facility] imaging. Tia will call to find out why the pt has not been contacted. Ottis Stain, CMA

## 2015-04-17 NOTE — Telephone Encounter (Signed)
The pt's daughter is calling because she states that they have been waiting for a phone call (for a week now) to inform the pt of an appt for a CT Scan. She is calling to check on this status and would like to know when the appt will be set up. Please inform at the earliest convenience. Thank you, Fonda Kinder, ASA

## 2015-04-19 ENCOUNTER — Ambulatory Visit
Admission: RE | Admit: 2015-04-19 | Discharge: 2015-04-19 | Disposition: A | Discharge status: Home / Self Care | Payer: Medicaid Other | Source: Ambulatory Visit | Attending: Family Medicine | Admitting: Family Medicine

## 2015-04-19 ENCOUNTER — Other Ambulatory Visit: Payer: Medicaid Other

## 2015-04-19 DIAGNOSIS — M546 Pain in thoracic spine: Secondary | ICD-10-CM

## 2015-04-19 DIAGNOSIS — M549 Dorsalgia, unspecified: Secondary | ICD-10-CM

## 2015-04-19 MED ORDER — IOPAMIDOL (ISOVUE-300) INJECTION 61%
95.0000 mL | Freq: Once | INTRAVENOUS | Status: AC | PRN
  Administered 2015-04-19: 95 mL via INTRAVENOUS

## 2015-04-20 ENCOUNTER — Ambulatory Visit (INDEPENDENT_AMBULATORY_CARE_PROVIDER_SITE_OTHER): Payer: Medicaid Other | Admitting: Family Medicine

## 2015-04-20 ENCOUNTER — Encounter: Payer: Self-pay | Admitting: Family Medicine

## 2015-04-20 VITALS — BP 140/62 | HR 78 | Temp 98.2°F | Ht 60.0 in | Wt 94.6 lb

## 2015-04-20 DIAGNOSIS — R938 Abnormal findings on diagnostic imaging of other specified body structures: Secondary | ICD-10-CM | POA: Diagnosis not present

## 2015-04-20 DIAGNOSIS — R9389 Abnormal findings on diagnostic imaging of other specified body structures: Secondary | ICD-10-CM

## 2015-04-20 MED ORDER — ONDANSETRON 4 MG PO TBDP: 4.0000 mg | ORAL_TABLET | Freq: Three times a day (TID) | ORAL | Status: AC | PRN

## 2015-04-20 MED ORDER — TRAMADOL HCL 50 MG PO TABS: 50.0000 mg | ORAL_TABLET | Freq: Three times a day (TID) | ORAL | Status: DC | PRN

## 2015-04-20 NOTE — Patient Instructions (Signed)
Thank you for coming in,   Please follow up with Dr. Gerlean Ren next week.   She will be able to discuss with you about the future plans of treatment.   Please bring all of your medications with you to each visit.    Please feel free to call with any questions or concerns at any time, at [phone]. --Dr. Raeford Razor

## 2015-04-20 NOTE — Progress Notes (Signed)
   Subjective:    Patient ID: Ashley Green, female    DOB: 03/20/1963, 81 y.o.   MRN: [id number]  HPI  Ashley Green is here for emesis.  Patient is spanish speaking and accompanied with her daughter. Translator present.   Complaint of pain in her back and worse when she is lying down. She is not eating normally and also having emesis.  Upon review of her imaging that was completed yesterday is showing concern for bronchogenic carcinoma with possible different sites of metastasis.  Patient is asking what is the next step.  Her daughter expresses that her mother was in so much pain last night that she was screaming, "let me die."    Health Maintenance:   Health Maintenance Due  Topic Date Due  . TETANUS/TDAP  03/30/1949  . COLONOSCOPY  03/30/1980  . ZOSTAVAX  03/30/1990  . DEXA SCAN  03/31/1995  . PNA vac Low Risk Adult (1 of 2 - PCV13) 03/31/1995    -  reports that she has never smoked. She does not have any smokeless tobacco history on file. - Review of Systems: Per HPI. All other systems reviewed and are negative. - Past Medical History: Patient Active Problem List   Diagnosis Date Noted  . Back pain 04/03/2015  . Dysphagia 04/03/2015  . Dizziness  01/27/2014  . Abdominal pain, unspecified site 07/20/2013  . Hyperlipidemia 10/15/2012  . GERD (gastroesophageal reflux disease) 10/15/2012  . Hypertension 07/31/2012  . History of colon cancer 07/31/2012   - Medications: reviewed and updated Current Outpatient Prescriptions  Medication Sig Dispense Refill  . aspirin 81 MG tablet Take 1 tablet (81 mg total) by mouth daily. 30 tablet 11  . atorvastatin (LIPITOR) 40 MG tablet Take 1 tablet (40 mg total) by mouth daily. 30 tablet 11  . baclofen (LIORESAL) 10 MG tablet Take 0.5 tablets (5 mg total) by mouth 3 (three) times daily. 30 each 0  . lisinopril (PRINIVIL,ZESTRIL) 5 MG tablet Take 1 tablet (5 mg total) by mouth daily. 90 tablet 3  . omeprazole (PRILOSEC) 40 MG capsule  Take 1 capsule (40 mg total) by mouth daily. 30 capsule 3  . Ostomy Supplies (NATURA STOMAHESIVE MOLDABLE) WAFR 1 each by Does not apply route 2 (two) times daily. 60 Wafer 12  . polyethylene glycol powder (MIRALAX) powder Take 17 g by mouth 2 (two) times daily. 255 g 0  . senna (SENOKOT) 8.6 MG TABS tablet Take 1-2 tablets (8.6-17.2 mg total) by mouth every 8 (eight) hours as needed. Take every 8 hours until significant BM occurs 15 each 0   No current facility-administered medications for this visit.     Review of Systems See HPI     Objective:   Physical Exam BP 140/62 mmHg  Pulse 78  Temp(Src) 98.2 F (36.8 C) (Oral)  Ht 5' (1.524 m)  Wt 94 lb 9.6 oz (42.91 kg)  BMI 18.48 kg/m2 Gen: NAD, alert, cooperative with exam, frail appearing elderly woman  CV: RRR, good S1/S2, no murmur,   Resp: CTABL, no wheezes, non-labored MSK: firm mass palpated on left thoracic region  Abd: soft, +BS, colostomy in LLQ, no masses  Skin: no rashes, normal turgor  Neuro: no gross deficits.     Greater than 50% of the time was spent counseling.      Assessment & Plan:

## 2015-04-21 ENCOUNTER — Other Ambulatory Visit: Payer: Medicaid Other

## 2015-04-21 ENCOUNTER — Encounter: Payer: Self-pay | Admitting: Physician Assistant

## 2015-04-21 ENCOUNTER — Ambulatory Visit (INDEPENDENT_AMBULATORY_CARE_PROVIDER_SITE_OTHER): Payer: Medicaid Other | Admitting: Physician Assistant

## 2015-04-21 VITALS — BP 108/60 | HR 80 | Ht 58.5 in | Wt 95.0 lb

## 2015-04-21 DIAGNOSIS — R1310 Dysphagia, unspecified: Secondary | ICD-10-CM

## 2015-04-21 DIAGNOSIS — R9389 Abnormal findings on diagnostic imaging of other specified body structures: Secondary | ICD-10-CM

## 2015-04-21 DIAGNOSIS — R935 Abnormal findings on diagnostic imaging of other abdominal regions, including retroperitoneum: Secondary | ICD-10-CM

## 2015-04-21 DIAGNOSIS — K869 Disease of pancreas, unspecified: Secondary | ICD-10-CM

## 2015-04-21 DIAGNOSIS — R229 Localized swelling, mass and lump, unspecified: Secondary | ICD-10-CM | POA: Diagnosis not present

## 2015-04-21 DIAGNOSIS — J984 Other disorders of lung: Secondary | ICD-10-CM | POA: Diagnosis not present

## 2015-04-21 DIAGNOSIS — R918 Other nonspecific abnormal finding of lung field: Secondary | ICD-10-CM

## 2015-04-21 DIAGNOSIS — R938 Abnormal findings on diagnostic imaging of other specified body structures: Secondary | ICD-10-CM

## 2015-04-21 NOTE — Progress Notes (Signed)
Patient ID: Ashley Green, female   DOB: Jan 18, 1928, 81 y.o.   MRN: [id number]   Subjective:    Patient ID: Ashley Green, female    DOB: 04-26-1928, 81 y.o.   MRN: [id number]  HPI Ashley Green is a pleasant 81 year old female from [place] who is non-English speaking, referred today by Dr. Eldridge Dace [facility] for evaluation of dysphagia. Patient has history of hypertension, GERD, and colon cancer which her daughter says was initially diagnosed in 24 and 2001 while they were in [place]. She had a colon resection and then had a recurrence 1 year later had a colostomy and radiation. Prior to this office visit patient had undergone CT of the abdomen pelvis and chest for complaints of weight loss. Unfortunately these revealed a large mass in the right upper lobe abutting the mediastinum measuring 4.5 x 3.1 x 5.2 cm there were several lymph nodes in the right suprahilar area also a low-density lesion in the pancreas body measuring 17 x 21 mm there is pancreatic ductal dilation. Unclear whether this would be a metastases or primary lesion. The lung lesion was felt per radiology to most likely represent a bronchogenic carcinoma. History is difficult due to language barrier but patient apparently has had some difficulty swallowing for several years worse over the past several months. She is now complaining of discomfort with swallowing and says it hurts "at the end of her esophagus". She also feels that food is sitting in her esophagus and she's having intermittent episodes of regurgitation with both liquids and solids. She has been filling up quickly and has lost a pack couple of pounds over the past few weeks and about 15 pounds over the past year. Her daughter was concerned because she is also been somewhat constipated and has had less ostomy output. Daughter is very anxious they were seen by another physician at the family practice clinic yesterday for nausea and Dr. Kenyon Ana apparently  is out of town and daughter . is anxious about proceeding with further workup. Labs are also reviewed from 04/17/2015 liver tests are normal WBC 8.4 hemoglobin 8.6 hematocrit of 25.7 MCV of 79 TSH of 3.66  Review of Systems Pertinent positive and negative review of systems were noted in the above HPI section.  All other review of systems was otherwise negative.  Outpatient Encounter Prescriptions as of 04/21/2015  Medication Sig  . baclofen (LIORESAL) 10 MG tablet Take 0.5 tablets (5 mg total) by mouth 3 (three) times daily.  Marland Kitchen omeprazole (PRILOSEC) 40 MG capsule Take 1 capsule (40 mg total) by mouth daily.  . ondansetron (ZOFRAN-ODT) 4 MG disintegrating tablet Take 1 tablet (4 mg total) by mouth every 8 (eight) hours as needed for nausea or vomiting.  . Ostomy Supplies (NATURA STOMAHESIVE MOLDABLE) WAFR 1 each by Does not apply route 2 (two) times daily.  . polyethylene glycol powder (MIRALAX) powder Take 17 g by mouth 2 (two) times daily.  Marland Kitchen senna (SENOKOT) 8.6 MG TABS tablet Take 1-2 tablets (8.6-17.2 mg total) by mouth every 8 (eight) hours as needed. Take every 8 hours until significant BM occurs  . traMADol (ULTRAM) 50 MG tablet Take 1 tablet (50 mg total) by mouth every 8 (eight) hours as needed.  . [DISCONTINUED] aspirin 81 MG tablet Take 1 tablet (81 mg total) by mouth daily. (Patient not taking: Reported on 04/21/2015)  . [DISCONTINUED] atorvastatin (LIPITOR) 40 MG tablet Take 1 tablet (40 mg total) by mouth daily. (Patient not taking: Reported  on 04/21/2015)  . [DISCONTINUED] lisinopril (PRINIVIL,ZESTRIL) 5 MG tablet Take 1 tablet (5 mg total) by mouth daily. (Patient not taking: Reported on 04/21/2015)   No facility-administered encounter medications on file as of 04/21/2015.   No Known Allergies Patient Active Problem List   Diagnosis Date Noted  . Back pain 04/03/2015  . Dysphagia 04/03/2015  . Dizziness  01/27/2014  . Abdominal pain, unspecified site 07/20/2013  . Hyperlipidemia  10/15/2012  . GERD (gastroesophageal reflux disease) 10/15/2012  . Hypertension 07/31/2012  . History of colon cancer 07/31/2012   History   Social History  . Marital Status: Single    Spouse Name: N/A  . Number of Children: 3  . Years of Education: N/A   Occupational History  . retired    Social History Main Topics  . Smoking status: Never Smoker   . Smokeless tobacco: Never Used  . Alcohol Use: No  . Drug Use: No  . Sexual Activity: No   Other Topics Concern  . Not on file   Social History Narrative   Works at [employer].     Ms. Crock family history includes Alcoholism in her brother; Brain cancer in her sister; Heart attack in her mother; Hodgkin's lymphoma in her son; Pneumonia in her brother and father.      Objective:    Filed Vitals:   04/21/15 1016  BP: 108/60  Pulse: 80    Physical Exam  well-developed elderly female who appears younger than her stated age, she is Hispanic and non-English speaking, accompanied by her daughter and an interpreter blood pressure 108/60 pulse 80 height 4 foot 10 weight 95. HEENT ;nontraumatic normocephalic EOMI PERRLA sclera anicteric, Supple; no JVD, Cardiovascular; regular rate and rhythm with S1-S2 no murmur or gallop, Pulmonary; clear bilaterally she does have a rounded firm mass palpable just to the right of the mid thoracic spine which is tender, Abdomen; soft, bowel sounds are present there is a colostomy in the left lower quadrant, no palpable mass or hepatosplenomegaly she has a small subcutaneous skin nodules on the right flank which is also tender, Rectal ;exam not done, Extremities ;no clubbing cyanosis or edema skin warm and dry, Neuropsych ;mood and affect appropriate       Assessment & Plan:   #1 81 yo female with dysphagia, weight loss, back pain, new painful skin nodules and abnormal CT of chest and abdomen  Showing a large RUL lung mass abutting mediastinum with adjacent nodes, and a small  pancreatic body mass - ? Primary bronchogenic CA.  Suspect dysphagia may be secondary to extrinsic compression #2 hx of colon ca 2001-colostomy #3 Anemia  Plan; Long discussion with daughter and pt-they are aware of Ct findings and likelihood of a primary malignancy that has spread. Pt is having pain but reluctant to take pain meds- encouraged  Schedule for Barium swallow  full liquid diet with supplements Continue omeprazole 40 mg po qd CEA. CA19-9 Schedule for PET scan  To help expedite workup- she will need bx of node or lung mass. Will communicate with her PCP Dr Gerlean Ren to arrange CT guided Bx next week after PET scan is done   Alfredia Ferguson PA-C 04/21/2015   Cc: Mariel Aloe, MD

## 2015-04-21 NOTE — Patient Instructions (Signed)
Please go to the basement level to have your labs drawn.  Drink Boost or Ensure 2-3 times daily.  You have been scheduled for a Barium Esophogram at [facility] (1st floor of the hospital) on Friday 04-28-2015 at 10:00 am  Please arrive 9:45 am prior to your appointment for registration. Make certain not to have anything to eat or drink 6 hours prior to your test. If you need to reschedule for any reason, please contact radiology at [phone] to do so. __________________________________________________________________ A barium swallow is an examination that concentrates on views of the esophagus. This tends to be a double contrast exam (barium and two liquids which, when combined, create a gas to distend the wall of the oesophagus) or single contrast (non-ionic iodine based). The study is usually tailored to your symptoms so a good history is essential. Attention is paid during the study to the form, structure and configuration of the esophagus, looking for functional disorders (such as aspiration, dysphagia, achalasia, motility and reflux) EXAMINATION You may be asked to change into a gown, depending on the type of swallow being performed. A radiologist and radiographer will perform the procedure. The radiologist will advise you of the type of contrast selected for your procedure and direct you during the exam. You will be asked to stand, sit or lie in several different positions and to hold a small amount of fluid in your mouth before being asked to swallow while the imaging is performed .In some instances you may be asked to swallow barium coated marshmallows to assess the motility of a solid food bolus. The exam can be recorded as a digital or video fluoroscopy procedure. POST PROCEDURE It will take 1-2 days for the barium to pass through your system. To facilitate this, it is important, unless otherwise directed, to increase your fluids for the next 24-48hrs and to resume your normal diet.   This test typically takes about 30 minutes to perform. _  .We have scheduled a Pet Scan at [facility] for Thursday 8-11 2016. Go to 1st floor Registration.  Do not have any food or liquid after midnight.  Arrive at 7:30 am.   We will call you with the results.

## 2015-04-22 LAB — CANCER ANTIGEN 19-9: CA 19-9: 247.6 U/mL — ABNORMAL HIGH (ref <35.0)

## 2015-04-22 LAB — CEA: CEA: 3.6 ng/mL (ref 0.0–5.0)

## 2015-04-23 ENCOUNTER — Encounter: Payer: Self-pay | Admitting: Family Medicine

## 2015-04-23 NOTE — Progress Notes (Signed)
i agree with the above note, plan 

## 2015-04-23 NOTE — Assessment & Plan Note (Signed)
Scan concerning for bronchogenic carcinoma with mets. Most likely most/if not all of her symptoms can be attributed to these findings.  Patient and daughter had several questions about the process and the next step  Daughter appeared very anxious after being delivered this information and tearful several times during the interview.  Patient is primary spanish speaking and her daughter understands most english.  Need to have discussion about goals of care going forward (surgery vs palliative) (Full code vs DNR). These types of conversations will most likely be done over a few visits and not all accomplished in one visit.  A relationship needs to develop so effective communication can be achieved.    - Will need to talk to [facility] about the reasonable treatment plans and present these to the patient - zofran was given for nausea  - tramadol given for pain   - discussed with Dr. Erin Hearing  - f/u with PCP next week. Would encourage to patient to f/u with PCP and not in sameday clinics

## 2015-04-26 ENCOUNTER — Telehealth: Payer: Self-pay | Admitting: Family Medicine

## 2015-04-26 NOTE — Telephone Encounter (Signed)
Daughter called because her mother doesn't feel good. They have an appointment on 8/9 but she really doesn't want to wait that long. She only wants to see Dr. Gerlean Ren. She would like to speak to the doctor about her mother asap. Ashley Green

## 2015-04-27 ENCOUNTER — Ambulatory Visit (INDEPENDENT_AMBULATORY_CARE_PROVIDER_SITE_OTHER): Payer: Medicaid Other | Admitting: Family Medicine

## 2015-04-27 ENCOUNTER — Encounter: Payer: Self-pay | Admitting: Family Medicine

## 2015-04-27 VITALS — BP 123/54 | HR 79 | Temp 98.4°F | Ht 58.5 in | Wt 94.6 lb

## 2015-04-27 DIAGNOSIS — G893 Neoplasm related pain (acute) (chronic): Secondary | ICD-10-CM

## 2015-04-27 MED ORDER — MORPHINE SULFATE 20 MG/5ML PO SOLN: 5.0000 mg | ORAL | Status: DC | PRN

## 2015-04-27 NOTE — Patient Instructions (Signed)
Thank you for coming to see me today. It was a pleasure. Today we talked about:   Pain: I am prescribing morphine for your pain. I will also refer you to hospice.   If you have any questions or concerns, please do not hesitate to call the office at [phone].  Sincerely,  Cordelia Poche, MD

## 2015-04-27 NOTE — Telephone Encounter (Signed)
Pt daughter calling, states that someone called her; phone note not documented as of yet. Would like a returned call. Thank you, Fonda Kinder, ASA

## 2015-04-27 NOTE — Progress Notes (Signed)
    Subjective   Ashley Green is a 81 y.o. female that presents for a same day visit  1. Cancer related pain: Pain has been worsening. She is mostly having lower left back pain. Her daughter states that her pain has become uncontrolled on Tramadol. She reports no shortness of breath or mental status changes. She has a recent CT chest that is significant for a mass in the musculature of her left thoracic back and right lung. No nausea, vomiting.  ROS Per HPI  History  Substance Use Topics  . Smoking status: Never Smoker   . Smokeless tobacco: Never Used  . Alcohol Use: No    No Known Allergies  Objective   BP 123/54 mmHg  Pulse 79  Temp(Src) 98.4 F (36.9 C) (Oral)  Ht 4' 10.5" (1.486 m)  Wt 94 lb 9.6 oz (42.91 kg)  BMI 19.43 kg/m2  General: Frail, no distress Cardiovascular: Regular rate and rhythm, no murmur Musculoskeletal: Large approx 4cm subcutaneous lesion that is slightly movable on left thoracic back that is tender with no erythema or fluctuance.  Skin: There is a small ~1cm subcutaneous nodule on her right abdomen that is non-tender, freely movable and non-erythematous with no central tract.  Assessment and Plan   Meds ordered this encounter  Medications  . morphine 20 MG/5ML solution    Sig: Take 1.3 mLs (5.2 mg total) by mouth every 4 (four) hours as needed for pain.    Dispense:  100 mL    Refill:  0    Cancer related pain  Refer to hospice (discussed with patient's daughter and PCP, Dr. Gerlean Ren)  Morphine '5mg'$  q4 hours PRN  Follow-up with PCP to discuss goals of care

## 2015-04-28 ENCOUNTER — Encounter: Payer: Self-pay | Admitting: Clinical

## 2015-04-28 ENCOUNTER — Ambulatory Visit (HOSPITAL_COMMUNITY)
Admission: RE | Admit: 2015-04-28 | Discharge: 2015-04-28 | Disposition: A | Discharge status: Home / Self Care | Payer: Medicaid Other | Source: Ambulatory Visit | Attending: Physician Assistant | Admitting: Physician Assistant

## 2015-04-28 DIAGNOSIS — R935 Abnormal findings on diagnostic imaging of other abdominal regions, including retroperitoneum: Secondary | ICD-10-CM

## 2015-04-28 DIAGNOSIS — R1310 Dysphagia, unspecified: Secondary | ICD-10-CM | POA: Diagnosis present

## 2015-04-28 DIAGNOSIS — K869 Disease of pancreas, unspecified: Secondary | ICD-10-CM

## 2015-04-28 DIAGNOSIS — R229 Localized swelling, mass and lump, unspecified: Secondary | ICD-10-CM

## 2015-04-28 DIAGNOSIS — K225 Diverticulum of esophagus, acquired: Secondary | ICD-10-CM | POA: Diagnosis not present

## 2015-04-28 DIAGNOSIS — R918 Other nonspecific abnormal finding of lung field: Secondary | ICD-10-CM

## 2015-04-28 DIAGNOSIS — R9389 Abnormal findings on diagnostic imaging of other specified body structures: Secondary | ICD-10-CM

## 2015-04-28 MED ORDER — IOHEXOL 300 MG/ML  SOLN
50.0000 mL | Freq: Once | INTRAMUSCULAR | Status: AC | PRN
  Administered 2015-04-28: 50 mL via ORAL

## 2015-04-28 NOTE — Progress Notes (Signed)
CSW has sent a referral to [place].  Hunt Oris, MSW, [place]

## 2015-04-28 NOTE — Telephone Encounter (Signed)
Called using interpreter Vicente Males, [phone]. If pt calls, please tell her we were calling to tell her the info below and I have asked Constance Holster and Dr. Cyndia Skeeters if they tried to reach the pt and neither have. Ottis Stain, CMA

## 2015-04-30 NOTE — Telephone Encounter (Signed)
I have not tried to contact the patient. I'm not sure who might have from our office other than Dr. Gerlean Ren.

## 2015-05-02 ENCOUNTER — Telehealth: Payer: Self-pay | Admitting: Hematology

## 2015-05-02 ENCOUNTER — Ambulatory Visit (INDEPENDENT_AMBULATORY_CARE_PROVIDER_SITE_OTHER): Payer: Medicaid Other | Admitting: Family Medicine

## 2015-05-02 VITALS — BP 105/82 | HR 91 | Temp 99.3°F | Ht 59.0 in | Wt 97.1 lb

## 2015-05-02 DIAGNOSIS — C251 Malignant neoplasm of body of pancreas: Secondary | ICD-10-CM | POA: Diagnosis not present

## 2015-05-02 DIAGNOSIS — K869 Disease of pancreas, unspecified: Secondary | ICD-10-CM

## 2015-05-02 DIAGNOSIS — K5909 Other constipation: Secondary | ICD-10-CM | POA: Diagnosis not present

## 2015-05-02 DIAGNOSIS — K8689 Other specified diseases of pancreas: Secondary | ICD-10-CM

## 2015-05-02 MED ORDER — POLYETHYLENE GLYCOL 3350 17 GM/SCOOP PO POWD: 17.0000 g | Freq: Two times a day (BID) | ORAL | Status: DC

## 2015-05-02 MED ORDER — TRAMADOL HCL 50 MG PO TABS: 50.0000 mg | ORAL_TABLET | Freq: Three times a day (TID) | ORAL | Status: DC | PRN

## 2015-05-02 MED ORDER — MORPHINE SULFATE 10 MG/5ML PO SOLN: 5.0000 mg | Freq: Four times a day (QID) | ORAL | Status: DC | PRN

## 2015-05-02 NOTE — Progress Notes (Signed)
Subjective:     Patient ID: Ashley Green, female   DOB: [birth year], 81 y.o.   MRN: [id number]  HPI Ashley Green is an 81yo female presenting today for follow up of back pain. Visit conducted with aid of Spanish Interpretor - Has been unable to pick up pain medication. Pharmacy stating they do not have it in stock - Significant back pain, same as last visit.  - Goals of Care discussed. She wants full workup and treatment at this time. Would like biopsy done to determine etiology of tumors. Would like referral to Oncology. Poor prognosis discussed.  - Discussed that treatment options would be established by Oncology. States she would like to have surgery, but knows she needs to wait to see if this is an option.  Chart Review: - Initially presented with back pain that work up at night, weight loss. Paraspinal mass palpable. - CT abdomen pelvis with left paraspinal mass at T7, pancreatic body lesion, right adrenal gland lesion, large right upper lobe lung mass concerning for bronchogenic carcinoma, and right hilar and potential right mediastinal nodal mass - CEA normal. CA 19-9 elevated to 247.6 - At last office visit, Morphine prescribed and Hospice involved for family support - Recently seen by Gastroenterology for difficulty swallowing  - Barrium swallow with mid-esophageal diverticuli, no masses or strictures noted  - Full liquid diet with supplementation  - Omeprazole  - PET Scan ordered.  Review of Systems  Constitutional: Positive for appetite change.  Musculoskeletal: Positive for back pain.       Objective:   Physical Exam  Constitutional: She appears well-developed and well-nourished. She appears distressed.  Intermittent distress secondary to back pain  HENT:  Head: Normocephalic and atraumatic.  Eyes: Pupils are equal, round, and reactive to light.  Cardiovascular: Normal rate and regular rhythm.  Exam reveals no gallop and no friction rub.   No murmur heard. Pulmonary/Chest:  Effort normal. No respiratory distress. She has no wheezes. She has no rales.  Abdominal: Soft. She exhibits no distension. There is no tenderness.  Musculoskeletal: She exhibits no edema.  Left paraspinal mass palpable over mid-thoracic spine  Skin: Skin is warm and dry. No rash noted.       Assessment:     Please refer to Problem List for Assessment.     Plan:     Please refer to Problem List for Plan.

## 2015-05-02 NOTE — Telephone Encounter (Signed)
New patient appt-s/w patient dtr and gave np appt for 08/10 @ 1:45 w/Dr. Irene Limbo Referring Dr. [name] Dx-malignant neoplasm of body of pancreas

## 2015-05-02 NOTE — Patient Instructions (Signed)
Thank you so much for coming to visit today! Please take Morphine as needed for pain. You may start out taking it at night to see if it helps! Please let me know if you have any problems filling this medication. I will also prescribe Miralax to help with constipation. I have placed an order for biopsy and for Oncology. You should be contacted concerning these appointments.   Thanks again and please let me know if there's anything else I can do for you! Dr. Gerlean Ren

## 2015-05-02 NOTE — Telephone Encounter (Signed)
Pt being seen by Dr. Gerlean Ren 05/02/2015 Ottis Stain, [place]

## 2015-05-03 ENCOUNTER — Encounter: Payer: Self-pay | Admitting: Hematology

## 2015-05-03 ENCOUNTER — Other Ambulatory Visit: Payer: Medicaid Other

## 2015-05-03 ENCOUNTER — Ambulatory Visit (HOSPITAL_BASED_OUTPATIENT_CLINIC_OR_DEPARTMENT_OTHER): Payer: Medicaid Other | Admitting: Hematology

## 2015-05-03 ENCOUNTER — Other Ambulatory Visit: Payer: Self-pay | Admitting: *Deleted

## 2015-05-03 ENCOUNTER — Telehealth: Payer: Self-pay | Admitting: Hematology

## 2015-05-03 ENCOUNTER — Ambulatory Visit: Payer: Medicaid Other

## 2015-05-03 VITALS — BP 114/55 | HR 88 | Temp 99.9°F | Resp 16 | Ht 59.0 in | Wt 95.4 lb

## 2015-05-03 DIAGNOSIS — C801 Malignant (primary) neoplasm, unspecified: Secondary | ICD-10-CM | POA: Diagnosis not present

## 2015-05-03 DIAGNOSIS — Z85038 Personal history of other malignant neoplasm of large intestine: Secondary | ICD-10-CM

## 2015-05-03 DIAGNOSIS — D75839 Thrombocytosis, unspecified: Secondary | ICD-10-CM

## 2015-05-03 DIAGNOSIS — D509 Iron deficiency anemia, unspecified: Secondary | ICD-10-CM

## 2015-05-03 DIAGNOSIS — C7970 Secondary malignant neoplasm of unspecified adrenal gland: Secondary | ICD-10-CM

## 2015-05-03 DIAGNOSIS — D473 Essential (hemorrhagic) thrombocythemia: Secondary | ICD-10-CM | POA: Diagnosis not present

## 2015-05-03 DIAGNOSIS — C7971 Secondary malignant neoplasm of right adrenal gland: Secondary | ICD-10-CM | POA: Diagnosis not present

## 2015-05-03 DIAGNOSIS — C7989 Secondary malignant neoplasm of other specified sites: Secondary | ICD-10-CM

## 2015-05-03 DIAGNOSIS — C800 Disseminated malignant neoplasm, unspecified: Secondary | ICD-10-CM

## 2015-05-03 DIAGNOSIS — C7972 Secondary malignant neoplasm of left adrenal gland: Secondary | ICD-10-CM

## 2015-05-03 DIAGNOSIS — D72829 Elevated white blood cell count, unspecified: Secondary | ICD-10-CM

## 2015-05-03 DIAGNOSIS — R1310 Dysphagia, unspecified: Secondary | ICD-10-CM | POA: Diagnosis not present

## 2015-05-03 NOTE — Telephone Encounter (Signed)
Pt confirmed labs/ov per 08/10 POF, gave pt avs and calendar.... KJ

## 2015-05-03 NOTE — Progress Notes (Signed)
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 patient.  All questions were answered. The patient knows to call the clinic with any problems, questions or concerns. I spent 60 minutes counseling the patient face to face. The total time spent in the appointment was 80 minutes and more than 50% was on counseling.     Sullivan Lone MD [place] [facility]  (Office):       [phone] (Work cell):  [phone] (Fax):           [phone]

## 2015-05-03 NOTE — Assessment & Plan Note (Addendum)
-   [facility]. States they carry morphine solution. Printed prescription given (solution concentration available different from prescription initially given last week. Old prescription shredded). Later contacted by patient's daughter after visit complete and states they are still having a hard time filling prescription. Refill of Tramadol given and instructed to take this until able to fill morphine solution. - Prescription for Miralax given for anticipated constipation with opioids - Referral to Oncology placed. Will follow up with them 8/10 - Obtain biopsy of paraspinal lesion - Poor prognosis and goals of care discussed. Would like full workup and treatment at this time - Hospice consulted at last office visit for support of patient and family. Poor prognosis.

## 2015-05-03 NOTE — Telephone Encounter (Signed)
They did not received the Rx for Tramadol.  Derl Barrow, RN

## 2015-05-04 ENCOUNTER — Other Ambulatory Visit: Payer: Self-pay | Admitting: *Deleted

## 2015-05-04 ENCOUNTER — Ambulatory Visit (HOSPITAL_COMMUNITY)
Admission: RE | Admit: 2015-05-04 | Discharge: 2015-05-04 | Disposition: A | Discharge status: Home / Self Care | Payer: Medicaid Other | Source: Ambulatory Visit | Attending: Physician Assistant | Admitting: Physician Assistant

## 2015-05-04 ENCOUNTER — Other Ambulatory Visit (HOSPITAL_BASED_OUTPATIENT_CLINIC_OR_DEPARTMENT_OTHER): Payer: Medicaid Other

## 2015-05-04 DIAGNOSIS — C7970 Secondary malignant neoplasm of unspecified adrenal gland: Secondary | ICD-10-CM | POA: Insufficient documentation

## 2015-05-04 DIAGNOSIS — R918 Other nonspecific abnormal finding of lung field: Secondary | ICD-10-CM | POA: Diagnosis not present

## 2015-05-04 DIAGNOSIS — C7989 Secondary malignant neoplasm of other specified sites: Secondary | ICD-10-CM | POA: Diagnosis not present

## 2015-05-04 DIAGNOSIS — K869 Disease of pancreas, unspecified: Secondary | ICD-10-CM

## 2015-05-04 DIAGNOSIS — C800 Disseminated malignant neoplasm, unspecified: Secondary | ICD-10-CM

## 2015-05-04 DIAGNOSIS — C772 Secondary and unspecified malignant neoplasm of intra-abdominal lymph nodes: Secondary | ICD-10-CM | POA: Diagnosis not present

## 2015-05-04 DIAGNOSIS — C7889 Secondary malignant neoplasm of other digestive organs: Secondary | ICD-10-CM | POA: Diagnosis not present

## 2015-05-04 DIAGNOSIS — C792 Secondary malignant neoplasm of skin: Secondary | ICD-10-CM | POA: Insufficient documentation

## 2015-05-04 DIAGNOSIS — Z85038 Personal history of other malignant neoplasm of large intestine: Secondary | ICD-10-CM | POA: Diagnosis not present

## 2015-05-04 DIAGNOSIS — R9389 Abnormal findings on diagnostic imaging of other specified body structures: Secondary | ICD-10-CM

## 2015-05-04 DIAGNOSIS — R935 Abnormal findings on diagnostic imaging of other abdominal regions, including retroperitoneum: Secondary | ICD-10-CM

## 2015-05-04 DIAGNOSIS — R229 Localized swelling, mass and lump, unspecified: Secondary | ICD-10-CM

## 2015-05-04 DIAGNOSIS — R1310 Dysphagia, unspecified: Secondary | ICD-10-CM

## 2015-05-04 LAB — CBC & DIFF AND RETIC
BASO%: 0.1 % (ref 0.0–2.0)
BASOS ABS: 0 10*3/uL (ref 0.0–0.1)
EOS ABS: 0.3 10*3/uL (ref 0.0–0.5)
EOS%: 2.6 % (ref 0.0–7.0)
HEMATOCRIT: 26.6 % — AB (ref 34.8–46.6)
HGB: 9.2 g/dL — ABNORMAL LOW (ref 11.6–15.9)
Immature Retic Fract: 14.6 % — ABNORMAL HIGH (ref 1.60–10.00)
LYMPH#: 0.5 10*3/uL — AB (ref 0.9–3.3)
LYMPH%: 4.1 % — ABNORMAL LOW (ref 14.0–49.7)
MCH: 25.9 pg (ref 25.1–34.0)
MCHC: 34.6 g/dL (ref 31.5–36.0)
MCV: 74.9 fL — AB (ref 79.5–101.0)
MONO#: 0.6 10*3/uL (ref 0.1–0.9)
MONO%: 4.9 % (ref 0.0–14.0)
NEUT#: 11.3 10*3/uL — ABNORMAL HIGH (ref 1.5–6.5)
NEUT%: 88.3 % — ABNORMAL HIGH (ref 38.4–76.8)
Platelets: 555 10*3/uL — ABNORMAL HIGH (ref 145–400)
RBC: 3.55 10*6/uL — AB (ref 3.70–5.45)
RDW: 14 % (ref 11.2–14.5)
RETIC %: 1.94 % (ref 0.70–2.10)
RETIC CT ABS: 68.87 10*3/uL (ref 33.70–90.70)
WBC: 12.8 10*3/uL — AB (ref 3.9–10.3)

## 2015-05-04 LAB — HOLD TUBE, BLOOD BANK

## 2015-05-04 LAB — COMPREHENSIVE METABOLIC PANEL (CC13)
ALBUMIN: 3.1 g/dL — AB (ref 3.5–5.0)
ALT: 15 U/L (ref 0–55)
AST: 10 U/L (ref 5–34)
Alkaline Phosphatase: 96 U/L (ref 40–150)
Anion Gap: 11 mEq/L (ref 3–11)
BUN: 13.4 mg/dL (ref 7.0–26.0)
CALCIUM: 9.7 mg/dL (ref 8.4–10.4)
CHLORIDE: 96 meq/L — AB (ref 98–109)
CO2: 24 meq/L (ref 22–29)
Creatinine: 0.7 mg/dL (ref 0.6–1.1)
EGFR: 79 mL/min/{1.73_m2} — AB (ref >90)
GLUCOSE: 175 mg/dL — AB (ref 70–140)
POTASSIUM: 3.9 meq/L (ref 3.5–5.1)
SODIUM: 131 meq/L — AB (ref 136–145)
Total Bilirubin: 0.59 mg/dL (ref 0.20–1.20)
Total Protein: 7.5 g/dL (ref 6.4–8.3)

## 2015-05-04 LAB — LACTATE DEHYDROGENASE (CC13): LDH: 186 U/L (ref 125–245)

## 2015-05-04 LAB — GLUCOSE, CAPILLARY: Glucose-Capillary: 148 mg/dL — ABNORMAL HIGH (ref 65–99)

## 2015-05-04 MED ORDER — FLUDEOXYGLUCOSE F - 18 (FDG) INJECTION
4.9500 | Freq: Once | INTRAVENOUS | Status: DC | PRN
  Administered 2015-05-04: 4.95 via INTRAVENOUS
  Filled 2015-05-04: qty 4.95

## 2015-05-05 ENCOUNTER — Encounter: Payer: Self-pay | Admitting: Hematology

## 2015-05-05 ENCOUNTER — Other Ambulatory Visit: Payer: Self-pay | Admitting: Radiology

## 2015-05-05 MED ORDER — SENNOSIDES-DOCUSATE SODIUM 8.6-50 MG PO TABS: 2.0000 | ORAL_TABLET | Freq: Every day | ORAL | Status: DC

## 2015-05-08 ENCOUNTER — Encounter (HOSPITAL_COMMUNITY): Payer: Self-pay

## 2015-05-08 ENCOUNTER — Ambulatory Visit (HOSPITAL_COMMUNITY)
Admission: RE | Admit: 2015-05-08 | Discharge: 2015-05-08 | Disposition: A | Discharge status: Home / Self Care | Payer: Medicaid Other | Source: Ambulatory Visit | Attending: Hematology | Admitting: Hematology

## 2015-05-08 ENCOUNTER — Ambulatory Visit (HOSPITAL_COMMUNITY): Admission: RE | Admit: 2015-05-08 | Payer: Medicaid Other | Source: Ambulatory Visit

## 2015-05-08 ENCOUNTER — Ambulatory Visit (HOSPITAL_COMMUNITY)
Admission: RE | Admit: 2015-05-08 | Discharge: 2015-05-08 | Disposition: A | Discharge status: Home / Self Care | Payer: Medicaid Other | Source: Ambulatory Visit | Attending: Family Medicine | Admitting: Family Medicine

## 2015-05-08 DIAGNOSIS — Z85048 Personal history of other malignant neoplasm of rectum, rectosigmoid junction, and anus: Secondary | ICD-10-CM | POA: Insufficient documentation

## 2015-05-08 DIAGNOSIS — C7989 Secondary malignant neoplasm of other specified sites: Secondary | ICD-10-CM | POA: Diagnosis not present

## 2015-05-08 DIAGNOSIS — K869 Disease of pancreas, unspecified: Secondary | ICD-10-CM | POA: Diagnosis not present

## 2015-05-08 DIAGNOSIS — C800 Disseminated malignant neoplasm, unspecified: Secondary | ICD-10-CM

## 2015-05-08 DIAGNOSIS — M799 Soft tissue disorder, unspecified: Secondary | ICD-10-CM | POA: Diagnosis present

## 2015-05-08 DIAGNOSIS — R911 Solitary pulmonary nodule: Secondary | ICD-10-CM | POA: Insufficient documentation

## 2015-05-08 LAB — CBC
HCT: 24.6 % — ABNORMAL LOW (ref 36.0–46.0)
Hemoglobin: 8.3 g/dL — ABNORMAL LOW (ref 12.0–15.0)
MCH: 25.5 pg — AB (ref 26.0–34.0)
MCHC: 33.7 g/dL (ref 30.0–36.0)
MCV: 75.5 fL — ABNORMAL LOW (ref 78.0–100.0)
Platelets: 560 10*3/uL — ABNORMAL HIGH (ref 150–400)
RBC: 3.26 MIL/uL — AB (ref 3.87–5.11)
RDW: 14.4 % (ref 11.5–15.5)
WBC: 10.6 10*3/uL — ABNORMAL HIGH (ref 4.0–10.5)

## 2015-05-08 LAB — PROTIME-INR
INR: 1.16 (ref 0.00–1.49)
Prothrombin Time: 15 seconds (ref 11.6–15.2)

## 2015-05-08 LAB — APTT: aPTT: 36 seconds (ref 24–37)

## 2015-05-08 MED ORDER — NALOXONE HCL 0.4 MG/ML IJ SOLN
INTRAMUSCULAR | Status: AC
  Filled 2015-05-08: qty 1

## 2015-05-08 MED ORDER — SODIUM CHLORIDE 0.9 % IV SOLN
INTRAVENOUS | Status: DC
  Administered 2015-05-08: 07:00:00 via INTRAVENOUS

## 2015-05-08 MED ORDER — FENTANYL CITRATE (PF) 100 MCG/2ML IJ SOLN
INTRAMUSCULAR | Status: AC
  Filled 2015-05-08: qty 2

## 2015-05-08 MED ORDER — MIDAZOLAM HCL 2 MG/2ML IJ SOLN
INTRAMUSCULAR | Status: AC | PRN
  Administered 2015-05-08: 0.5 mg via INTRAVENOUS

## 2015-05-08 MED ORDER — FENTANYL CITRATE (PF) 100 MCG/2ML IJ SOLN
INTRAMUSCULAR | Status: AC | PRN
  Administered 2015-05-08: 25 ug via INTRAVENOUS

## 2015-05-08 MED ORDER — FLUMAZENIL 0.5 MG/5ML IV SOLN
INTRAVENOUS | Status: AC
  Filled 2015-05-08: qty 5

## 2015-05-08 MED ORDER — MIDAZOLAM HCL 2 MG/2ML IJ SOLN
INTRAMUSCULAR | Status: AC
  Filled 2015-05-08: qty 2

## 2015-05-08 NOTE — Progress Notes (Signed)
At 1025 sat patient on the edge of bed and took VS. Via Daughter interpreting pt states she is dizzy and wished to lie down. Pt slept for about 30 minutes sat her on edge of bed again BP 99/41. Pt states she feels she could go home now. Ambulated in room with daughter.

## 2015-05-08 NOTE — H&P (Signed)
HPI: Patient with history of colorectal cancer and with new evidence of metastatic disease, unknown primary. She is scheduled today for left paraspinal mass biopsy.   The patient has had a H&P performed within the last 30 days, all history, medications, and exam have been reviewed. The patient denies any interval changes since the H&P.  Medications: Prior to Admission medications   Medication Sig Start Date End Date Taking? Authorizing Provider  Cyanocobalamin (VITAMIN B 12 PO) Take 1 tablet by mouth 2 (two) times daily.   Yes Historical Provider, MD  Docusate Calcium (STOOL SOFTENER PO) Take 1 tablet by mouth daily.   Yes Historical Provider, MD  magnesium citrate SOLN Take 0.5 Bottles by mouth once.   Yes Historical Provider, MD  morphine 10 MG/5ML solution Take 2.5 mLs (5 mg total) by mouth every 6 (six) hours as needed for severe pain. Patient taking differently: Take 3 mg by mouth 3 (three) times daily as needed for severe pain.  05/02/15  Yes [place] N Rumley, DO  ondansetron (ZOFRAN-ODT) 4 MG disintegrating tablet Take 1 tablet (4 mg total) by mouth every 8 (eight) hours as needed for nausea or vomiting. Patient taking differently: Take 4 mg by mouth daily as needed for nausea or vomiting.  04/20/15  Yes [name], MD  polyethylene glycol powder (MIRALAX) powder Take 17 g by mouth 2 (two) times daily. Patient taking differently: Take 17 g by mouth daily.  05/02/15  Yes [place] N Rumley, DO  omeprazole (PRILOSEC) 40 MG capsule Take 1 capsule (40 mg total) by mouth daily. Patient not taking: Reported on 05/08/2015 10/15/12   Dayarmys Piloto de [name], MD  Ostomy Supplies (NATURA STOMAHESIVE MOLDABLE) [facility] 1 each by Does not apply route 2 (two) times daily. Patient not taking: Reported on 05/08/2015 02/24/13   Dayarmys Piloto de [name], MD  senna-docusate (SENNA S) 8.6-50 MG per tablet Take 2 tablets by mouth at bedtime. Patient not taking: Reported on 05/08/2015 05/05/15   Brunetta Genera, MD     Vital Signs: BP 126/40 mmHg  Pulse 73  Temp(Src) 98.3 F (36.8 C) (Oral)  Resp 16  SpO2 96%  Physical Exam  Constitutional: She is oriented to person, place, and time. No distress.  HENT:  Head: Normocephalic and atraumatic.  Cardiovascular: Normal rate and regular rhythm.  Exam reveals no gallop and no friction rub.   No murmur heard. Pulmonary/Chest: Effort normal and breath sounds normal. No respiratory distress. She has no wheezes. She has no rales.  Abdominal: Soft. Bowel sounds are normal.  Musculoskeletal:  Left paraspinal mass  Neurological: She is alert and oriented to person, place, and time.  Skin: She is not diaphoretic.    Mallampati Score:  MD Evaluation Airway: WNL Heart: WNL Abdomen: WNL Chest/ Lungs: WNL ASA  Classification: 3 Mallampati/Airway Score: Two  Labs:  CBC:  Recent Labs  04/17/15 1610 05/04/15 1017 05/08/15 0705  WBC 8.4 12.8* 10.6*  HGB 8.6* 9.2* 8.3*  HCT 25.7* 26.6* 24.6*  PLT 595* 555* 560*    COAGS:  Recent Labs  05/08/15 0705  INR 1.16  APTT 36    BMP:  Recent Labs  04/17/15 1610 05/04/15 1017  NA 137 131*  K 4.6 3.9  CL 100  --   CO2 26 24  GLUCOSE 93 175*  BUN 17 13.4  CALCIUM 9.3 9.7  CREATININE 0.57* 0.7    LIVER FUNCTION TESTS:  Recent Labs  04/17/15 1610 05/04/15 1017  BILITOT 0.2 0.59  AST 11 10  ALT 12 15  ALKPHOS 76 96  PROT 6.6 7.5  ALBUMIN 3.7 3.1*    Assessment/Plan:  History of colorectal cancer New evidence of metastatic disease unknown primary PET 05/04/15 Seen by Dr. Irene Limbo 05/05/15 Scheduled today for image guided left paraspinal mass biopsy The patient has been NPO, no blood thinners taken, labs and vitals have been reviewed. Risks and Benefits discussed with the patient including, but not limited to bleeding, infection, damage to adjacent structures or low yield requiring additional tests. All of the patient's questions were answered, patient is agreeable to  proceed. Consent signed and in chart.    SignedHedy Jacob 05/08/2015, 8:26 AM

## 2015-05-08 NOTE — Procedures (Addendum)
Interventional Radiology Procedure Note  Procedure: US guided core biopsy left paraspinal mass  Complications: None  Estimated Blood Loss: 0  Recommendations: - Bedrest x 1 hr  Signed,  Criselda Peaches, MD

## 2015-05-08 NOTE — Discharge Instructions (Signed)
Aspiracin con Chauncy Lean fina (Fine Needle Aspiration) La aspiracin con aguja fina es un procedimiento utilizado para extraer una porcin de tejido. El tejido puede tomarse de una regin inflamada o crecimiento anormal (tumor ). Tambin se utiliza para confirmar la presencia de un quiste. Un quiste es una cavidad llena de lquido. Este procedimiento lo realiza un mdico o Scientist, research (medical).  COMENTE CON SU MDICO ACERCA DE:  Todos los medicamentos que toma, especialmente anticoagulantes, como la aspirina.  Cualquier problema que pueda haber tenido con procedimientos similares en el pasado. RIESGOS Y COMPLICACIONES Es un procedimiento muy seguro. Existe un pequeo riesgo de infeccin y hemorragia. Si el lugar que debe aspirarse es profundo, podr haber otras complicaciones. El profesional que lo asiste podr darle todas las explicaciones que necesite.  ANTES DEL PROCEDIMIENTO  En la mayora de los casos no se necesita preparacn especial. El mdico le dir si hay requerimientos especiales, como tener el estmago vaco.  Formule todas las preguntas antes de que el procedimiento comience. PROCEDIMIENTO  Este procedimiento se realiza bajo anestesia local o sin anestesia.  Se higieniza la piel cuidadosamente.  Una aguja muy delgada se introduce en el tumor. La aguja se dirige hacia diferentes direcciones mientras se aplica una succin a travs de ella. Cuando se obtienen las muestras necesarias, la aguja se Production manager.  El contenido de lo que se ha obtenido se Therapist, nutritional un portaobjetos. Luego se fija, se tie y se examina en el microscopio. Un especialista en el examen de tejidos (patlogo ) observa el portaobjetos.  Entonces puede determinarse un diagnstico. El patlogo decidir si la muestra es cancerosa (maligna) o no es un tumor canceroso( benigna ). Si se obtiene lquido de un quiste, se examinan sus clulas. Si no se obtiene material con el procedimiento de aspiracin por aguja fina, la  muestra no descartar un problema. En algunos casos el procedimiento debe realizarse nuevamente. El patlogo podr necesitar varios das antes de obtener el resultado. DESPUS DEL PROCEDIMIENTO  Luego de este procedimiento, no hay restricciones en la dieta ni en la actividad.  El mdico le dar las indicaciones. Tambin lo informar acerca de cmo mantener el sitio limpio y [place] instrucciones cuidadosamente.  Llame para conocer el resultado del estudio segn le indique el mdico. Recuerde, es responsabilidad suya retirar los resultados de todas las [place]. No piense que el resultado es normal si esta informacin no se la brinda el profesional.  Mantenga una vigilancia estrecha de la zona de la aspiracin. Informe todo tipo de irritacin, hinchazn o supuracin.  No debera sentir dolor.  Tome los [employer] le indic el profesional. SOLICITE ATENCIN MDICA SI:  Siente dolor u observa una secrecin que no desaparece.  La hinchazn de la zona no desaparece gradualmente. SOLICITE ATENCIN MDICA DE INMEDIATO SI:  [place] despus de la aspiracin.  Siente dolor intenso en la zona.  Observa que la zona se hincha, est caliente y le duele. Document Released: 09/09/2005 Document Revised: 12/02/2011 [facility] Patient Information 2015 [place]. This information is not intended to replace advice given to you by your health care provider. Make sure you discuss any questions you have with your health care provider. Conscious Sedation Sedation is the use of medicines to promote relaxation and relieve discomfort and anxiety. Conscious sedation is a type of sedation. Under conscious sedation you are less alert than normal but are still able to respond to instructions or stimulation. Conscious sedation is used during  short medical and dental procedures. It is milder than deep sedation or general anesthesia and allows you to return to your regular  activities sooner.  LET [facility] CARE PROVIDER KNOW ABOUT:   Any allergies you have.  All medicines you are taking, including vitamins, herbs, eye drops, creams, and over-the-counter medicines.  Use of steroids (by mouth or creams).  Previous problems you or members of your family have had with the use of anesthetics.  Any blood disorders you have.  Previous surgeries you have had.  Medical conditions you have.  Possibility of pregnancy, if this applies.  Use of cigarettes, alcohol, or illegal drugs. RISKS AND COMPLICATIONS Generally, this is a safe procedure. However, as with any procedure, problems can occur. Possible problems include:  Oversedation.  Trouble breathing on your own. You may need to have a breathing tube until you are awake and breathing on your own.  Allergic reaction to any of the medicines used for the procedure. BEFORE THE PROCEDURE  You may have blood tests done. These tests can help show how well your kidneys and liver are working. They can also show how well your blood clots.  A physical exam will be done.  Only take medicines as directed by your health care provider. You may need to stop taking medicines (such as blood thinners, aspirin, or nonsteroidal anti-inflammatory drugs) before the procedure.   Do not eat or drink at least 6 hours before the procedure or as directed by your health care provider.  Arrange for a responsible adult, family member, or friend to take you home after the procedure. He or she should stay with you for at least 24 hours after the procedure, until the medicine has worn off. PROCEDURE   An intravenous (IV) catheter will be inserted into one of your veins. Medicine will be able to flow directly into your body through this catheter. You may be given medicine through this tube to help prevent pain and help you relax.  The medical or dental procedure will be done. AFTER THE PROCEDURE  You will stay in a recovery area  until the medicine has worn off. Your blood pressure and pulse will be checked.   Depending on the procedure you had, you may be allowed to go home when you can tolerate liquids and your pain is under control. Document Released: 06/04/2001 Document Revised: 09/14/2013 Document Reviewed: 05/17/2013 [facility] Patient Information 2015 [place], [place]. This information is not intended to replace advice given to you by your health care provider. Make sure you discuss any questions you have with your health care provider.

## 2015-05-10 ENCOUNTER — Ambulatory Visit (HOSPITAL_COMMUNITY)
Admission: RE | Admit: 2015-05-10 | Discharge: 2015-05-10 | Disposition: A | Discharge status: Home / Self Care | Payer: Medicaid Other | Source: Ambulatory Visit | Attending: Hematology | Admitting: Hematology

## 2015-05-10 DIAGNOSIS — C800 Disseminated malignant neoplasm, unspecified: Secondary | ICD-10-CM

## 2015-05-10 DIAGNOSIS — Z9181 History of falling: Secondary | ICD-10-CM | POA: Insufficient documentation

## 2015-05-10 DIAGNOSIS — C799 Secondary malignant neoplasm of unspecified site: Secondary | ICD-10-CM | POA: Insufficient documentation

## 2015-05-10 DIAGNOSIS — G319 Degenerative disease of nervous system, unspecified: Secondary | ICD-10-CM | POA: Diagnosis not present

## 2015-05-10 DIAGNOSIS — Z85038 Personal history of other malignant neoplasm of large intestine: Secondary | ICD-10-CM | POA: Diagnosis not present

## 2015-05-10 DIAGNOSIS — R930 Abnormal findings on diagnostic imaging of skull and head, not elsewhere classified: Secondary | ICD-10-CM | POA: Insufficient documentation

## 2015-05-10 DIAGNOSIS — M479 Spondylosis, unspecified: Secondary | ICD-10-CM | POA: Insufficient documentation

## 2015-05-10 DIAGNOSIS — I739 Peripheral vascular disease, unspecified: Secondary | ICD-10-CM | POA: Diagnosis not present

## 2015-05-10 MED ORDER — GADOBENATE DIMEGLUMINE 529 MG/ML IV SOLN: 9.0000 mL | Freq: Once | INTRAVENOUS | Status: DC | PRN

## 2015-05-17 ENCOUNTER — Encounter: Payer: Self-pay | Admitting: Hematology

## 2015-05-17 ENCOUNTER — Ambulatory Visit (HOSPITAL_BASED_OUTPATIENT_CLINIC_OR_DEPARTMENT_OTHER): Payer: Medicaid Other | Admitting: Hematology

## 2015-05-17 VITALS — BP 137/44 | HR 85 | Temp 98.9°F | Resp 16 | Ht 59.0 in | Wt 98.2 lb

## 2015-05-17 DIAGNOSIS — D509 Iron deficiency anemia, unspecified: Secondary | ICD-10-CM | POA: Diagnosis not present

## 2015-05-17 DIAGNOSIS — C7971 Secondary malignant neoplasm of right adrenal gland: Secondary | ICD-10-CM

## 2015-05-17 DIAGNOSIS — G893 Neoplasm related pain (acute) (chronic): Secondary | ICD-10-CM

## 2015-05-17 DIAGNOSIS — C3490 Malignant neoplasm of unspecified part of unspecified bronchus or lung: Secondary | ICD-10-CM | POA: Diagnosis not present

## 2015-05-17 DIAGNOSIS — C7989 Secondary malignant neoplasm of other specified sites: Secondary | ICD-10-CM | POA: Diagnosis not present

## 2015-05-17 DIAGNOSIS — R530 Neoplastic (malignant) related fatigue: Secondary | ICD-10-CM

## 2015-05-17 DIAGNOSIS — D649 Anemia, unspecified: Secondary | ICD-10-CM

## 2015-05-17 DIAGNOSIS — D473 Essential (hemorrhagic) thrombocythemia: Secondary | ICD-10-CM | POA: Diagnosis not present

## 2015-05-17 DIAGNOSIS — C7972 Secondary malignant neoplasm of left adrenal gland: Secondary | ICD-10-CM | POA: Diagnosis not present

## 2015-05-17 DIAGNOSIS — D630 Anemia in neoplastic disease: Secondary | ICD-10-CM

## 2015-05-17 DIAGNOSIS — D729 Disorder of white blood cells, unspecified: Secondary | ICD-10-CM

## 2015-05-18 ENCOUNTER — Telehealth: Payer: Self-pay | Admitting: *Deleted

## 2015-05-18 ENCOUNTER — Encounter: Payer: Self-pay | Admitting: Hematology

## 2015-05-18 ENCOUNTER — Other Ambulatory Visit: Payer: Self-pay | Admitting: Physician Assistant

## 2015-05-18 ENCOUNTER — Ambulatory Visit (HOSPITAL_BASED_OUTPATIENT_CLINIC_OR_DEPARTMENT_OTHER): Payer: Medicaid Other

## 2015-05-18 ENCOUNTER — Telehealth: Payer: Self-pay | Admitting: Hematology

## 2015-05-18 ENCOUNTER — Other Ambulatory Visit: Payer: Medicaid Other

## 2015-05-18 DIAGNOSIS — C7972 Secondary malignant neoplasm of left adrenal gland: Secondary | ICD-10-CM | POA: Diagnosis not present

## 2015-05-18 DIAGNOSIS — D630 Anemia in neoplastic disease: Secondary | ICD-10-CM | POA: Diagnosis not present

## 2015-05-18 DIAGNOSIS — C3490 Malignant neoplasm of unspecified part of unspecified bronchus or lung: Secondary | ICD-10-CM | POA: Insufficient documentation

## 2015-05-18 DIAGNOSIS — D509 Iron deficiency anemia, unspecified: Secondary | ICD-10-CM | POA: Diagnosis not present

## 2015-05-18 DIAGNOSIS — C7971 Secondary malignant neoplasm of right adrenal gland: Secondary | ICD-10-CM | POA: Diagnosis not present

## 2015-05-18 DIAGNOSIS — C800 Disseminated malignant neoplasm, unspecified: Secondary | ICD-10-CM

## 2015-05-18 LAB — CBC & DIFF AND RETIC
BASO%: 0.2 % (ref 0.0–2.0)
Basophils Absolute: 0 10*3/uL (ref 0.0–0.1)
EOS%: 2.2 % (ref 0.0–7.0)
Eosinophils Absolute: 0.2 10*3/uL (ref 0.0–0.5)
HCT: 23.7 % — ABNORMAL LOW (ref 34.8–46.6)
HGB: 8.1 g/dL — ABNORMAL LOW (ref 11.6–15.9)
Immature Retic Fract: 14.7 % — ABNORMAL HIGH (ref 1.60–10.00)
LYMPH#: 0.4 10*3/uL — AB (ref 0.9–3.3)
LYMPH%: 4.1 % — ABNORMAL LOW (ref 14.0–49.7)
MCH: 24.7 pg — AB (ref 25.1–34.0)
MCHC: 34.2 g/dL (ref 31.5–36.0)
MCV: 72.3 fL — ABNORMAL LOW (ref 79.5–101.0)
MONO#: 0.7 10*3/uL (ref 0.1–0.9)
MONO%: 6.8 % (ref 0.0–14.0)
NEUT%: 86.7 % — ABNORMAL HIGH (ref 38.4–76.8)
NEUTROS ABS: 8.9 10*3/uL — AB (ref 1.5–6.5)
NRBC: 0 % (ref 0–0)
Platelets: 613 10*3/uL — ABNORMAL HIGH (ref 145–400)
RBC: 3.28 10*6/uL — ABNORMAL LOW (ref 3.70–5.45)
RDW: 15.2 % — AB (ref 11.2–14.5)
RETIC %: 2.06 % (ref 0.70–2.10)
Retic Ct Abs: 67.57 10*3/uL (ref 33.70–90.70)
WBC: 10.2 10*3/uL (ref 3.9–10.3)

## 2015-05-18 LAB — COMPREHENSIVE METABOLIC PANEL (CC13)
ALT: 22 U/L (ref 0–55)
AST: 15 U/L (ref 5–34)
Albumin: 2.6 g/dL — ABNORMAL LOW (ref 3.5–5.0)
Alkaline Phosphatase: 96 U/L (ref 40–150)
Anion Gap: 11 mEq/L (ref 3–11)
BUN: 9.7 mg/dL (ref 7.0–26.0)
CHLORIDE: 97 meq/L — AB (ref 98–109)
CO2: 23 mEq/L (ref 22–29)
Calcium: 9.6 mg/dL (ref 8.4–10.4)
Creatinine: 0.7 mg/dL (ref 0.6–1.1)
EGFR: 80 mL/min/{1.73_m2} — AB (ref >90)
GLUCOSE: 173 mg/dL — AB (ref 70–140)
POTASSIUM: 4.6 meq/L (ref 3.5–5.1)
SODIUM: 130 meq/L — AB (ref 136–145)
Total Bilirubin: 0.43 mg/dL (ref 0.20–1.20)
Total Protein: 6.8 g/dL (ref 6.4–8.3)

## 2015-05-18 LAB — IRON AND TIBC CHCC
%SAT: 6 % — AB (ref 21–57)
Iron: 13 ug/dL — ABNORMAL LOW (ref 41–142)
TIBC: 229 ug/dL — AB (ref 236–444)
UIBC: 216 ug/dL (ref 120–384)

## 2015-05-18 LAB — FERRITIN CHCC: FERRITIN: 172 ng/mL (ref 9–269)

## 2015-05-18 MED ORDER — MORPHINE SULFATE (CONCENTRATE) 20 MG/ML PO SOLN: 5.0000 mg | ORAL | Status: DC | PRN

## 2015-05-18 MED ORDER — OXYCODONE HCL ER 10 MG PO T12A: 10.0000 mg | EXTENDED_RELEASE_TABLET | Freq: Two times a day (BID) | ORAL | Status: DC

## 2015-05-18 MED ORDER — DEXAMETHASONE 2 MG PO TABS: 2.0000 mg | ORAL_TABLET | Freq: Every day | ORAL | Status: DC

## 2015-05-18 MED ORDER — ONDANSETRON HCL 8 MG PO TABS: 4.0000 mg | ORAL_TABLET | Freq: Four times a day (QID) | ORAL | Status: DC | PRN

## 2015-05-18 NOTE — Telephone Encounter (Signed)
VERBAL ORDER AND READ BACK TO DR.Kenton [place] 200ML FOR A THIRTY DAY SUPPLY. NOTIFIED [place]. SHE VOICES UNDERSTANDING.

## 2015-05-18 NOTE — Progress Notes (Signed)
I faxed nctracks for oxycontin

## 2015-05-18 NOTE — Telephone Encounter (Signed)
PHARMACY NEEDS PRESCRIPTIONS UNDER ANOTHER PHYSICIAN.

## 2015-05-18 NOTE — Telephone Encounter (Signed)
THIS REQUEST WAS GIVEN TO MANAGED CARE.

## 2015-05-18 NOTE — Telephone Encounter (Signed)
per pof tos ch pt appt-sch pt radon appt w/karen-cld Delle Reining to adv Dr Irene Limbo to put Mri Brain in order not referral-cld grier in Social work and adviced of referral-trans call to [employer] pt copy of avs

## 2015-05-19 ENCOUNTER — Telehealth: Payer: Self-pay | Admitting: Hematology

## 2015-05-19 ENCOUNTER — Encounter: Payer: Self-pay | Admitting: *Deleted

## 2015-05-19 ENCOUNTER — Other Ambulatory Visit: Payer: Self-pay | Admitting: *Deleted

## 2015-05-19 ENCOUNTER — Other Ambulatory Visit: Payer: Medicaid Other

## 2015-05-19 LAB — CORTISOL: CORTISOL PLASMA: 20.6 ug/dL

## 2015-05-19 NOTE — Progress Notes (Signed)
.    Hematology oncology CONSULT NOTE Date of service: 05/17/2015   Patient Care Team: Lorna Few, DO as PCP - General  CHIEF COMPLAINTS: Follow-up for newly diagnosed Metastatic poorly differentiated carcinoma with sarcomatoid features.  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   HISTORY OF PRESENTING ILLNESS: Please see my previous clinic note for details of initial presentation.  Interval History  Ashley Green is here for follow-up with her daughter to discuss the results of her biopsy, PET/CT scan and to determine treatment plan for her newly diagnosed cancer. She notes that her back pain is about the same and is controlled with the sublingual morphine when she takes it but wakes up in severe pain since she does not take any pain medications overnight. Has been using the sublingual morphine about 3-4 times a day. Is okay with the idea of adding a long-acting medication in addition to allow for smoother pain control. Still similarly short of breath. No fevers or chills. No acute new symptoms. 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  MEDICAL HISTORY:  Past Medical History  Diagnosis Date  . Hypertension   . Colon cancer   . Bowel obstruction   . GERD (gastroesophageal reflux disease) 10/15/2012    SURGICAL HISTORY: Past Surgical History  Procedure Laterality Date  . Colon surgery    . Colectomy  s/p colon cancer 2000    . Colostomy      SOCIAL HISTORY: Social History   Social History  . Marital Status: Single    Spouse Name: N/A  . Number of Children: 3  . Years of Education: N/A   Occupational History  . retired    Social History Main Topics  . Smoking status: Never Smoker   . Smokeless tobacco: Never Used  . Alcohol Use: No  . Drug Use: No  . Sexual Activity: No   Other Topics Concern  . Not on file   Social History Narrative   Works at [employer].     FAMILY HISTORY: Family History  Problem Relation Age of Onset  . Hodgkin's lymphoma Son   . Heart attack Mother   . Brain cancer Sister   . Pneumonia Father   . Alcoholism Brother   . Pneumonia Brother     ALLERGIES:  has No Known Allergies.  MEDICATIONS:  Current Outpatient Prescriptions  Medication Sig Dispense Refill  . Cyanocobalamin (VITAMIN B 12 PO) Take 1 tablet by mouth 2 (two) times daily.    Marland Kitchen dexamethasone (DECADRON) 2 MG tablet Take 1 tablet (2 mg total) by mouth daily. 30 tablet 0  . Docusate Calcium (STOOL SOFTENER PO) Take 1 tablet by mouth daily.    . magnesium citrate SOLN Take 0.5 Bottles by mouth once.    . morphine (ROXANOL) 20 MG/ML concentrated solution Take 0.25-0.5 mLs (5-10 mg total) by mouth every 2 (two) hours as needed for severe pain or breakthrough pain. 200 mL 0  . omeprazole (PRILOSEC) 40 MG capsule Take 1 capsule (40 mg total) by mouth daily. (Patient not taking: Reported on 05/08/2015) 30 capsule 3  . ondansetron (ZOFRAN) 8 MG tablet Take 0.5 tablets (4 mg total) by mouth every 6 (six) hours as needed for nausea or vomiting. 30 tablet 3  . ondansetron (ZOFRAN-ODT) 4 MG disintegrating tablet Take 1 tablet (4 mg total) by mouth every 8 (eight) hours as needed for nausea or vomiting. (Patient taking differently: Take 4 mg by mouth  daily as needed for nausea or vomiting. ) 20 tablet 0  . Ostomy Supplies (NATURA STOMAHESIVE MOLDABLE) WAFR 1 each by Does not apply route 2 (two) times daily. (Patient not taking: Reported on 05/08/2015) 60 Wafer 12  . OxyCODONE (OXYCONTIN) 10 mg T12A 12 hr tablet Take 1 tablet (10 mg total) by mouth every 12 (twelve) hours. 60 tablet 0  . polyethylene glycol powder (MIRALAX) powder Take 17 g by mouth 2 (two) times daily. (Patient taking differently: Take 17 g by mouth daily. ) 255 g 3  . senna-docusate (SENNA S) 8.6-50 MG per tablet Take 2 tablets by mouth at bedtime. (Patient not taking: Reported on 05/08/2015) 60  tablet 1   No current facility-administered medications for this visit.    REVIEW OF SYSTEMS:   Constitutional: Denies fevers, chills or abnormal night sweats Eyes: Denies blurriness of vision, double vision or watery eyes Ears, nose, mouth, throat, and face: Denies mucositis or sore throat Respiratory: Denies cough, dyspnea or wheezes Cardiovascular: Denies palpitation, chest discomfort or lower extremity swelling Gastrointestinal:  Denies nausea, heartburn or change in bowel habits Skin: Denies abnormal skin rashes Lymphatics: Denies new lymphadenopathy or easy bruising Neurological:Denies numbness, tingling or new weaknesses Behavioral/Psych: Mood is stable, no new changes  All other systems were reviewed with the patient and are negative.   PHYSICAL EXAMINATION: ECOG PERFORMANCE STATUS: 1 - Symptomatic but completely ambulatory  Filed Vitals:   05/17/15 1442  BP: 137/44  Pulse: 85  Temp: 98.9 F (37.2 C)  Resp: 16   Filed Weights   05/17/15 1442  Weight: 98 lb 3.2 oz (44.543 kg)    GENERAL:alert, mild distress due to left upper back pain. SKIN: Palpable left upper back mass is firm with some tenderness to palpation no redness. One dominant and a few small right abdominal wall nodules. EYES: normal, conjunctiva mild pallor, sclera clear anicteric  OROPHARYNX:no exudate, no erythema and lips, buccal mucosa, and tongue normal  NECK: supple, thyroid normal size, non-tender, without nodularity LYMPH:  no palpable lymphadenopathy in cervical, axillary or inguinal LUNGS: clear to auscultation with normal breathing effort HEART: regular rate & rhythm and no murmurs and no lower extremity edema ABDOMEN:abdomen soft, non-tender and normal bowel sounds Musculoskeletal:no cyanosis of digits and no clubbing  PSYCH: alert & oriented x 3 with fluent speech NEURO: no focal motor/sensory deficits  LABORATORY DATA: this is Dr. Georgina Peer how you're doing this wanted to follow-up and see  how everything isvisit Margarito  . CBC Latest Ref Rng 05/18/2015 05/08/2015 05/04/2015  WBC 3.9 - 10.3 10e3/uL 10.2 10.6(H) 12.8(H)  Hemoglobin 11.6 - 15.9 g/dL 8.1(L) 8.3(L) 9.2(L)  Hematocrit 34.8 - 46.6 % 23.7(L) 24.6(L) 26.6(L)  Platelets 145 - 400 10e3/uL 613(H) 560(H) 555(H)    Recent Labs  04/17/15 1610 05/04/15 1017 05/18/15 1058  NA 137 131* 130*  K 4.6 3.9 4.6  CL 100  --   --   CO2 '26 24 23  ' GLUCOSE 93 175* 173*  BUN 17 13.4 9.7  CREATININE 0.57* 0.7 0.7  CALCIUM 9.3 9.7 9.6  PROT 6.6 7.5 6.8  ALBUMIN 3.7 3.1* 2.6*  AST '11 10 15  ' ALT '12 15 22  ' ALKPHOS 76 96 96  BILITOT 0.2 0.59 0.43   . Lab Results  Component Value Date   CA199 247.6* 04/21/2015   . Lab Results  Component Value Date   CEA 3.6 04/21/2015    . Lab Results  Component Value Date   LDH 186 05/04/2015  RADIOGRAPHIC STUDIES: I have personally reviewed the radiological images as listed and agreed with the findings in the report. Ct Chest W Contrast  04/26/2015   ADDENDUM REPORT: 04/26/2015 13:59  ADDENDUM: There is additional lesion within the paraspinal musculature of the LEFT back. This lesion is enhancing and ovoid measuring 2.1 x 2.7 cm in axial dimension and 3.7 cm in craniocaudad dimension. Lesion is present on image 27, series 2 at the T7 vertebral body level. This lesion would be amenable to ultrasound-guided biopsy.  Findings conveyed Vanetta Shawl, DOon 04/26/2015  at13:59.   Electronically Signed   By: Suzy Bouchard M.D.   On: 04/26/2015 13:59   04/26/2015   CLINICAL DATA:  Weight loss over 1 year time. LEFT back pain. Constipation. History of colon cancer resection 2001.  EXAM: CT CHEST, ABDOMEN, AND PELVIS WITH CONTRAST  TECHNIQUE: Multidetector CT imaging of the chest, abdomen and pelvis was performed following the standard protocol during bolus administration of intravenous contrast.  CONTRAST:  36m ISOVUE-300 IOPAMIDOL (ISOVUE-300) INJECTION 61%  COMPARISON:  None.   FINDINGS: CT CHEST FINDINGS  Mediastinum/Nodes: No axillary or supraclavicular lymphadenopathy. No clear mediastinal lymphadenopathy. No central pulmonary embolism.  Lungs/Pleura: There is a pulmonary mass in the RIGHT upper lobe abutting the mediastinum measuring 4.5 x 3.1 x 5.2 cm. There several lymph nodes in the RIGHT suprahilar location versus RIGHT lower paratracheal nodal station measuring 13 mm and 9 mm on image 18, series 2. No additional pulmonary nodules are evident.  CT ABDOMEN AND PELVIS FINDINGS  Hepatobiliary: No focal hepatic lesion. No biliary duct dilatation. Gallbladder is normal. Common bile duct is normal.  Pancreas: Low-density lesion in the pancreas measuring 17 mm x 21 mm on image 55, series 2. There is dilatation of the pancreatic duct proximal to this lesion. Pancreatic duct through the pancreatic head is normal. The common bile duct is normal.  Spleen: Normal spleen  Adrenals/urinary tract: The LEFT adrenal gland is enlarged to 18 mm. The kidneys are normal. The bladder is compressed by the bowel over the pelvic floor.  Stomach/Bowel: Stomachand proximal small bowel are normal. There is stasis enteric contents in the distal small bowel. The small bowel is collapsed leading up to terminal ileum. Poor progression of the oral contrast. There is moderate volume stool through the colon and rectosigmoid colon. There is a LEFT lower quadrant colostomy. Patient status post proctectomy.  Vascular/Lymphatic: Abdominal aorta is normal caliber. There is no retroperitoneal or periportal lymphadenopathy. No pelvic lymphadenopathy.  Reproductive: Post hysterectomy anatomy  Musculoskeletal: No aggressive osseous lesion.  Other: No free fluid.  IMPRESSION: Chest Impression:  1. Large RIGHT upper lobe mass abutting the pleural surface is most concerning for bronchogenic carcinoma. 2. RIGHT hilar and potential RIGHT mediastinal nodal metastasis.  Abdomen / Pelvis Impression:  1. Lesion within the body of the  pancreas with a proximal ductal dilatation is consistent with either a primary pancreatic neoplasm or a metastatic lesion. 2. Enlargement of the RIGHT adrenal gland is most suggestive of a metastatic lesion. 3. Poor progression of oral contrast and some stasis of enteric contents distal small bowel. No obstructing lesion identified. Moderate volume stool through the colon leading up to the colostomy. 4. FDG PET CT scan may be beneficial for complete staging and biopsy guidance. These results will be called to the ordering clinician or representative by the Radiologist Assistant, and communication documented in the PACS or zVision Dashboard.  Electronically Signed: By: SSuzy BouchardM.D. On: 04/19/2015 18:10  Mr Jeri Cos Wo Contrast  05/10/2015   CLINICAL DATA:  Patient fell 1 month ago. Unsure if she struck head. History of colon cancer. Staging.  EXAM: MRI HEAD WITHOUT CONTRAST  TECHNIQUE: Multiplanar, multiecho pulse sequences of the brain and surrounding structures were obtained without intravenous contrast.  COMPARISON:  MR head 03/22/2011.  FINDINGS: Patient was unable to tolerate standard MRI brain imaging with and without contrast. No contrast could be administered. Only a few sequences were obtained. Some of these are motion degraded.  Sagittal T1 weighted images demonstrate no midline abnormality. There is mild cervical spondylosis without osseous destruction.  On diffusion-weighted imaging, there is a small 3 mm focus of restricted diffusion, in the LEFT frontal subcortical white matter as seen on image 35 series 4, which could represent ischemia or small metastasis. No definite surrounding vasogenic edema on T2 imaging. No other definite areas of restricted diffusion are observed.  Generalized atrophy. Moderately advanced T2 and FLAIR hyperintensities representing small vessel disease. T2 and FLAIR axial images are insufficiently detailed, with too much motion, to allow detailed correlation with the  LEFT frontal diffusion abnormality.  IMPRESSION: Suboptimal exam secondary to patient motion, and inability to tolerate the routine sequences required for pre and postcontrast imaging. No post infusion imaging was obtained.  3 mm focus of restricted diffusion LEFT frontal subcortical white matter could represent a small acute infarct or mass. See discussion above.  Generalized atrophy with small vessel disease.  Consider repeat imaging with sedation or pain medicine to allow complete assessment of the brain with and without contrast.  These results will be called to the ordering clinician or representative by the Radiologist Assistant, and communication documented in the PACS or zVision Dashboard.   Electronically Signed   By: Staci Righter M.D.   On: 05/10/2015 17:15   Ct Abdomen Pelvis W Contrast  04/26/2015   ADDENDUM REPORT: 04/26/2015 13:59  ADDENDUM: There is additional lesion within the paraspinal musculature of the LEFT back. This lesion is enhancing and ovoid measuring 2.1 x 2.7 cm in axial dimension and 3.7 cm in craniocaudad dimension. Lesion is present on image 27, series 2 at the T7 vertebral body level. This lesion would be amenable to ultrasound-guided biopsy.  Findings conveyed Vanetta Shawl, DOon 04/26/2015  at13:59.   Electronically Signed   By: Suzy Bouchard M.D.   On: 04/26/2015 13:59   04/26/2015   CLINICAL DATA:  Weight loss over 1 year time. LEFT back pain. Constipation. History of colon cancer resection 2001.  EXAM: CT CHEST, ABDOMEN, AND PELVIS WITH CONTRAST  TECHNIQUE: Multidetector CT imaging of the chest, abdomen and pelvis was performed following the standard protocol during bolus administration of intravenous contrast.  CONTRAST:  42m ISOVUE-300 IOPAMIDOL (ISOVUE-300) INJECTION 61%  COMPARISON:  None.  FINDINGS: CT CHEST FINDINGS  Mediastinum/Nodes: No axillary or supraclavicular lymphadenopathy. No clear mediastinal lymphadenopathy. No central pulmonary embolism.   Lungs/Pleura: There is a pulmonary mass in the RIGHT upper lobe abutting the mediastinum measuring 4.5 x 3.1 x 5.2 cm. There several lymph nodes in the RIGHT suprahilar location versus RIGHT lower paratracheal nodal station measuring 13 mm and 9 mm on image 18, series 2. No additional pulmonary nodules are evident.  CT ABDOMEN AND PELVIS FINDINGS  Hepatobiliary: No focal hepatic lesion. No biliary duct dilatation. Gallbladder is normal. Common bile duct is normal.  Pancreas: Low-density lesion in the pancreas measuring 17 mm x 21 mm on image 55, series 2. There is dilatation of the pancreatic  duct proximal to this lesion. Pancreatic duct through the pancreatic head is normal. The common bile duct is normal.  Spleen: Normal spleen  Adrenals/urinary tract: The LEFT adrenal gland is enlarged to 18 mm. The kidneys are normal. The bladder is compressed by the bowel over the pelvic floor.  Stomach/Bowel: Stomachand proximal small bowel are normal. There is stasis enteric contents in the distal small bowel. The small bowel is collapsed leading up to terminal ileum. Poor progression of the oral contrast. There is moderate volume stool through the colon and rectosigmoid colon. There is a LEFT lower quadrant colostomy. Patient status post proctectomy.  Vascular/Lymphatic: Abdominal aorta is normal caliber. There is no retroperitoneal or periportal lymphadenopathy. No pelvic lymphadenopathy.  Reproductive: Post hysterectomy anatomy  Musculoskeletal: No aggressive osseous lesion.  Other: No free fluid.  IMPRESSION: Chest Impression:  1. Large RIGHT upper lobe mass abutting the pleural surface is most concerning for bronchogenic carcinoma. 2. RIGHT hilar and potential RIGHT mediastinal nodal metastasis.  Abdomen / Pelvis Impression:  1. Lesion within the body of the pancreas with a proximal ductal dilatation is consistent with either a primary pancreatic neoplasm or a metastatic lesion. 2. Enlargement of the RIGHT adrenal gland  is most suggestive of a metastatic lesion. 3. Poor progression of oral contrast and some stasis of enteric contents distal small bowel. No obstructing lesion identified. Moderate volume stool through the colon leading up to the colostomy. 4. FDG PET CT scan may be beneficial for complete staging and biopsy guidance. These results will be called to the ordering clinician or representative by the Radiologist Assistant, and communication documented in the PACS or zVision Dashboard.  Electronically Signed: By: Suzy Bouchard M.D. On: 04/19/2015 18:10   Dg Esophagus  04/28/2015   CLINICAL DATA:  Difficulty swallowing liquids and solids. Weight loss. Lung mass and pancreatic mass.  EXAM: ESOPHOGRAM/BARIUM SWALLOW  TECHNIQUE: Single contrast examination was performed using water-soluble and thin barium.  FLUOROSCOPY TIME:  Radiation Exposure Index (as provided by the fluoroscopic device):  If the device does not provide the exposure index:  Fluoroscopy Time:  1 minutes and 45 seconds  Number of Acquired Images:  COMPARISON:  Chest CT 04/19/2015  FINDINGS: Mid esophageal diverticuli are noted. No mass or obvious stricture. No hiatal hernia or GE reflux.  IMPRESSION: Mid esophageal diverticuli.  No esophageal mass or stricture.  No hiatal hernia or GE reflux.   Electronically Signed   By: Marijo Sanes M.D.   On: 04/28/2015 13:06   Nm Pet Image Initial (pi) Skull Base To Thigh  05/04/2015   CLINICAL DATA:  Initial treatment strategy for right lower lobe lung mass with pancreatic lesion and multiple skin nodules. History of colon cancer.  EXAM: NUCLEAR MEDICINE PET SKULL BASE TO THIGH  TECHNIQUE: 4.95 mCi F-18 FDG was injected intravenously. Full-ring PET imaging was performed from the skull base to thigh after the radiotracer. CT data was obtained and used for attenuation correction and anatomic localization.  FASTING BLOOD GLUCOSE:  Value: 148 mg/dl  COMPARISON:  CTs of the chest, abdomen and pelvis 04/19/2015.   FINDINGS: NECK  No hypermetabolic cervical lymph nodes are identified.There are no lesions of the pharyngeal mucosal space. There is mildly asymmetric activity associated with the muscles of phonation, within physiologic limits.  CHEST  The large right upper lobe mass is hypermetabolic. This measures 5.0 x 3.9 cm on image 17 and has an SUV max of 21.8. This is contiguous with a hypermetabolic low right paratracheal node  measuring 11 mm on image 63. No hypermetabolic hilar nodal activity. There is a hypermetabolic left paraesophageal node inferior to the left hilum on image 69. In addition, there are several hypermetabolic left paraspinal soft tissue nodules posterior to the descending thoracic aorta. These are noted adjacent to the left T7-8, T8-9 and T9-10 foramina and demonstrate mild foraminal extension. 4 mm left lower lobe pulmonary nodule on image 50 is too small to evaluate with PET.  ABDOMEN/PELVIS  There are bilateral adrenal metastases with hypermetabolic activity. The right adrenal lesion is larger, measuring 4.1 x 2.6 and has an SUV max of 22.7. There are no lesions within the liver or spleen. However, there are 2 hypermetabolic pancreatic lesions, the largest within the pancreatic head demonstrated on prior CT and having an SUV max of 22.4. There is a large hypermetabolic nodal mass in the right ileocolonic mesenteric, measuring 4.9 x 3.8 cm on image 141 and having an SUV max of 21.1. There are other hypermetabolic mesenteric nodes within the false pelvis. There is a hypermetabolic mass within the left iliopsoas muscle. There is also a hypermetabolic nodule anteriorly in the omentum (image 114. There is hypermetabolic subcutaneous nodule in the right lateral upper abdominal wall (image 113). This has enlarged compared with the recent CT. There is persistent dilated small bowel in the pelvis.  SKELETON  There is no hypermetabolic activity to suggest osseous metastatic disease. There is nonunion of a  fracture involving the right femoral greater trochanter. As above, there are hypermetabolic paraspinal masses in the mid left thoracic region with foraminal extension. There is a hypermetabolic soft tissue mass within the left erector spinae musculature at the same levels, demonstrating an SUV max of 19.2.  IMPRESSION: 1. There are multiple hypermetabolic lesions within the chest, abdomen and pelvis. The dominant right upper lobe mass is morphologically suggestive of primary lung cancer. 2. There are multiple hypermetabolic metastases involving the adrenal glands, the pancreas, several abdominal pelvic lymph nodes/mesenteric nodules, a subcutaneous nodule in the right anterior abdominal wall, and multiple paraspinal soft tissue nodules in the left thoracic region. The nodal/mesenteric findings in the abdomen and pelvis are more suggestive of metastatic colon cancer. 3. Stable small bowel dilatation in the pelvis.   Electronically Signed   By: Richardean Sale M.D.   On: 05/04/2015 10:30   US Biopsy  05/08/2015   CLINICAL DATA:  81 year old female with a history of colon cancer and now with multifocal metastatic disease of uncertain etiology. There is a larger lesion in the right upper lobe of the lung as well as a lesion in the pancreatic head. Differential considerations include primary lung, pancreas and colon cancer. Ultrasound-guided core biopsy is warranted to facilitate tissue diagnosis.  EXAM: ULTRASOUND BIOPSY CORE LIVER  Date: 05/08/2015  PROCEDURE: 1. Ultrasound-guided core biopsy of paraspinal soft tissue mass. Interventional Radiologist:  Criselda Peaches, MD  ANESTHESIA/SEDATION: Moderate (conscious) sedation was used. 0.5 mg Versed, 25 mcg Fentanyl were administered intravenously. The patient's vital signs were monitored continuously by radiology nursing throughout the procedure.  Sedation Time: 8 minutes  MEDICATIONS: None additional  TECHNIQUE: Informed consent was obtained from the patient  following explanation of the procedure, risks, benefits and alternatives. The patient understands, agrees and consents for the procedure. All questions were addressed. A time out was performed.  The left paraspinal soft tissues were interrogated with ultrasound. An approximately 4.2 by 2.0 complex hypoechoic soft tissue mass can be identified. A suitable skin entry site was selected and marked. The  region was then sterilely prepped and draped in the standard fashion using Betadine skin prep. Local anesthesia was attained by infiltration with 1% lidocaine. A small dermatotomy was made. Under real-time sonographic guidance, multiple 18 gauge core biopsies were obtained using the bio Pince automated biopsy device. Biopsy specimens were placed in saline and delivered to the lab for further evaluation.  Post biopsy imaging demonstrates no evidence of complication. The biopsy tracts remain visible within the mass. The patient tolerated the procedure well.  COMPLICATIONS: None  Estimated blood loss: 0  IMPRESSION: Technically successful ultrasound-guided core biopsy of left paraspinal mass.  Signed,  Criselda Peaches, MD  [facility]  [facility]   Electronically Signed   By: Jacqulynn Cadet M.D.   On: 05/08/2015 11:29    ASSESSMENT & PLAN:   Ashley Green is a very pleasant 81 year old Hispanic female in good overall health with ECOG performance status of 1 and previous history of colon cancer in 2000 and 2001 now with  #1 Stage IV Metastatic poorly differentiated lung carcinoma with sarcomatoid features with  mediastinal adenopathy, metastases to bilateral adrenal glands, paraspinal muscles and pancreas as well as abdominal lymph nodes and skin.  CEA level within normal limits. CA-19-9 level somewhat elevated likely due to metastases to the pancreas and less consistent with primary pancreatic metastatic cancer. LDH level within normal limits. Good ECOG performance  status is 1 #2  Painful metastases to the spinal muscles over upper back. #3 Shortness of breath due to her lung mass causing some airway compression. #4  Symptomatic anemia due to her metastatic cancer.  Plan -I discussed the diagnosis, staging prognosis and treatment options with the patient and her daughter speaks good English and was helping with translating today. -We discussed that this is a very aggressive form of lung malignancy that is relatively rare as well and tends to have less responsiveness to chemotherapy then the other types of non-small cell lung cancer. -We will send out the blood EGFR mutation analysis to speed up the results. Patient has been a lifelong nonsmoker. Did have secondhand smoke exposure from her father. Burnis Medin send out the blood Guardant testing. -We will refer the patient for an ultrasound-guided core biopsy of her abdominal wall nodule to get additional tissue for EGFR mutation testing, alk gene rearrangement and Ros 1 testing.  -Urgent radiation oncology referral for palliative radiation to her paraspinal metastases are causing significant back pain as well as chest wall pain due to nerve root overt impingement. -Consideration of palliative RT to her lung mass to help with airway obstruction related symptoms.  -Will plan to do IV Nivolumab for systemic treatment alongside radiation therapy. There is data with increased presence of a PD1 lymphocytes and PDL 1 production by tumor of this type. -We are choosing this systemic therapy in line with the tumor histology, patient's ability to tolerate chemotherapy and the risk of increased radiation toxicities with concurrent chemotherapy.  -I talked to her daughter and we have arranged blood transfusion for symptomatic anemia on 05/22/2015 after her chemotherapy counseling visit.  #5 Pain due to her cancer Plan -Patient will need long-acting pain medications and was given OxyContin 10 mg twice a day. -Continue  Roxanol 5-10 mg sublingual every 2 hours as needed for breakthrough pain. -Senna S for bowel prophylaxis -Zofran when necessary for nausea. -Radiation therapy for pain control.  #6 Microcytic anemia due to anemia of chronic disease ferritin level 171 - we'll transfuse for symptomatically anemia   #  7bilateral adrenal metastases with high risk for adrenal insufficiency. No overt hypotension today and no issues with hypoglycemia at this time. Plan -We'll start the patient on dexamethasone 2 mg by mouth daily for fatigue as well as appetite.  #8 dysphagia -no intraluminal masses on esophagogram . This symptom is likely from extrinsic compression from right upper lobe lung mass. -Might consider palliative radiation therapy to address this.Radiation oncology has been consulted   #9 history of colorectal cancer diagnosed in 2000 treated with surgery followed by chemotherapy and radiation for her daughter. She had recurrence in 2001 when she was treated with further surgery and intraoperative radiation. Does not report having had any colonoscopy since then. CEA level currently within normal limits. Notes some constipation. No overt blood in the stools or melena. 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 No fevers or chills. Low threshold for treating with antibiotics with fevers due to high risk of postobstructive pneumonia.  I appreciate the privilege of taking care of this wonderful patient.  All questions were answered. The patient knows to call the clinic with any problems, questions or concerns. I spent 35 minutes counseling the patient face to face. The total time spent in the appointment was 60 minutes and more than 50% was on counseling.    Sullivan Lone MD [place] AAHIVMS [facility] Hematology/Oncology Physician [place]  (Office):       [phone] (Work cell):  [phone] (Fax):           [phone]

## 2015-05-19 NOTE — Progress Notes (Signed)
[place] Work  Clinical Social Work received referral from scheduling from [employer]. Referral need not clear currently. CSW phoned pt's daughter and introduced self and explained role of CSW. Daughter denied current needs, but is open to [place] meeting with them during first chemo to further explore local resources and options for support. Daughter appreciated call.   Clinical Social Work interventions: Resource education   Loren Racer, [place] Worker [place]  [place] Phone: [phone] Fax: [phone]

## 2015-05-19 NOTE — Telephone Encounter (Signed)
per Dr Irene Limbo to sch Chemo Edu class-cld & spoke to daugter [facility] and she agreed to come with mother to class

## 2015-05-22 ENCOUNTER — Ambulatory Visit (HOSPITAL_COMMUNITY)
Admission: RE | Admit: 2015-05-22 | Discharge: 2015-05-22 | Disposition: A | Discharge status: Home / Self Care | Payer: Medicaid Other | Source: Ambulatory Visit | Attending: Hematology | Admitting: Hematology

## 2015-05-22 ENCOUNTER — Ambulatory Visit: Payer: Medicaid Other

## 2015-05-22 ENCOUNTER — Other Ambulatory Visit: Payer: Medicaid Other

## 2015-05-22 ENCOUNTER — Other Ambulatory Visit: Payer: Self-pay | Admitting: Physician Assistant

## 2015-05-22 ENCOUNTER — Ambulatory Visit (HOSPITAL_BASED_OUTPATIENT_CLINIC_OR_DEPARTMENT_OTHER): Payer: Medicaid Other

## 2015-05-22 ENCOUNTER — Other Ambulatory Visit: Payer: Self-pay | Admitting: *Deleted

## 2015-05-22 VITALS — BP 112/43 | HR 84 | Temp 98.6°F | Resp 18

## 2015-05-22 DIAGNOSIS — C3490 Malignant neoplasm of unspecified part of unspecified bronchus or lung: Secondary | ICD-10-CM

## 2015-05-22 DIAGNOSIS — D509 Iron deficiency anemia, unspecified: Secondary | ICD-10-CM

## 2015-05-22 DIAGNOSIS — D649 Anemia, unspecified: Secondary | ICD-10-CM | POA: Diagnosis not present

## 2015-05-22 DIAGNOSIS — D630 Anemia in neoplastic disease: Secondary | ICD-10-CM

## 2015-05-22 DIAGNOSIS — C800 Disseminated malignant neoplasm, unspecified: Secondary | ICD-10-CM

## 2015-05-22 LAB — SPEP & IFE WITH QIG
Albumin ELP: 2.5 g/dL — ABNORMAL LOW (ref 3.8–4.8)
Alpha-1-Globulin: 0.8 g/dL — ABNORMAL HIGH (ref 0.2–0.3)
Alpha-2-Globulin: 1.2 g/dL — ABNORMAL HIGH (ref 0.5–0.9)
BETA GLOBULIN: 0.5 g/dL (ref 0.4–0.6)
Beta 2: 0.3 g/dL (ref 0.2–0.5)
Gamma Globulin: 1 g/dL (ref 0.8–1.7)
IGA: 185 mg/dL (ref 69–380)
IgG (Immunoglobin G), Serum: 1050 mg/dL (ref 690–1700)
IgM, Serum: 157 mg/dL (ref 52–322)
Total Protein, Serum Electrophoresis: 6.2 g/dL (ref 6.1–8.1)

## 2015-05-22 LAB — PREPARE RBC (CROSSMATCH)

## 2015-05-22 LAB — ABO/RH: ABO/RH(D): O POS

## 2015-05-22 MED ORDER — SODIUM CHLORIDE 0.9 % IV SOLN
250.0000 mL | Freq: Once | INTRAVENOUS | Status: AC
  Administered 2015-05-22: 250 mL via INTRAVENOUS

## 2015-05-22 MED ORDER — ACETAMINOPHEN 325 MG PO TABS
650.0000 mg | ORAL_TABLET | Freq: Once | ORAL | Status: AC
  Administered 2015-05-22: 650 mg via ORAL

## 2015-05-22 MED ORDER — DIPHENHYDRAMINE HCL 25 MG PO CAPS
ORAL_CAPSULE | ORAL | Status: AC
  Filled 2015-05-22: qty 2

## 2015-05-22 MED ORDER — DIPHENHYDRAMINE HCL 25 MG PO CAPS
25.0000 mg | ORAL_CAPSULE | Freq: Once | ORAL | Status: AC
  Administered 2015-05-22: 25 mg via ORAL

## 2015-05-22 MED ORDER — ACETAMINOPHEN 325 MG PO TABS
ORAL_TABLET | ORAL | Status: AC
  Filled 2015-05-22: qty 2

## 2015-05-22 NOTE — Patient Instructions (Signed)

## 2015-05-23 ENCOUNTER — Other Ambulatory Visit: Payer: Self-pay | Admitting: Hematology

## 2015-05-23 ENCOUNTER — Ambulatory Visit (HOSPITAL_COMMUNITY)
Admission: RE | Admit: 2015-05-23 | Discharge: 2015-05-23 | Disposition: A | Discharge status: Home / Self Care | Payer: Medicaid Other | Source: Ambulatory Visit | Attending: Hematology | Admitting: Hematology

## 2015-05-23 ENCOUNTER — Encounter (HOSPITAL_COMMUNITY): Payer: Self-pay

## 2015-05-23 DIAGNOSIS — C3490 Malignant neoplasm of unspecified part of unspecified bronchus or lung: Secondary | ICD-10-CM

## 2015-05-23 DIAGNOSIS — R222 Localized swelling, mass and lump, trunk: Secondary | ICD-10-CM | POA: Insufficient documentation

## 2015-05-23 LAB — CBC
HCT: 28.9 % — ABNORMAL LOW (ref 36.0–46.0)
Hemoglobin: 9.9 g/dL — ABNORMAL LOW (ref 12.0–15.0)
MCH: 25.6 pg — AB (ref 26.0–34.0)
MCHC: 34.3 g/dL (ref 30.0–36.0)
MCV: 74.7 fL — ABNORMAL LOW (ref 78.0–100.0)
PLATELETS: 549 10*3/uL — AB (ref 150–400)
RBC: 3.87 MIL/uL (ref 3.87–5.11)
RDW: 16.3 % — AB (ref 11.5–15.5)
WBC: 15.1 10*3/uL — AB (ref 4.0–10.5)

## 2015-05-23 LAB — TYPE AND SCREEN
ABO/RH(D): O POS
Antibody Screen: NEGATIVE
Unit division: 0

## 2015-05-23 LAB — APTT: aPTT: 33 seconds (ref 24–37)

## 2015-05-23 LAB — PROTIME-INR
INR: 1.22 (ref 0.00–1.49)
PROTHROMBIN TIME: 15.5 s — AB (ref 11.6–15.2)

## 2015-05-23 MED ORDER — OXYCODONE HCL ER 10 MG PO T12A
10.0000 mg | EXTENDED_RELEASE_TABLET | Freq: Two times a day (BID) | ORAL | Status: DC
  Administered 2015-05-23: 10 mg via ORAL
  Filled 2015-05-23: qty 1

## 2015-05-23 MED ORDER — FENTANYL CITRATE (PF) 100 MCG/2ML IJ SOLN
INTRAMUSCULAR | Status: AC | PRN
  Administered 2015-05-23 (×2): 25 ug via INTRAVENOUS

## 2015-05-23 MED ORDER — SODIUM CHLORIDE 0.9 % IV SOLN
INTRAVENOUS | Status: DC
  Administered 2015-05-23: 12:00:00 via INTRAVENOUS

## 2015-05-23 MED ORDER — MIDAZOLAM HCL 2 MG/2ML IJ SOLN
INTRAMUSCULAR | Status: AC
  Filled 2015-05-23: qty 6

## 2015-05-23 MED ORDER — MORPHINE SULFATE (CONCENTRATE) 10 MG/0.5ML PO SOLN
20.0000 mg | ORAL | Status: DC | PRN
  Filled 2015-05-23: qty 1

## 2015-05-23 MED ORDER — OXYCODONE HCL ER 10 MG PO T12A
10.0000 mg | EXTENDED_RELEASE_TABLET | Freq: Two times a day (BID) | ORAL | Status: DC
  Filled 2015-05-23: qty 1

## 2015-05-23 MED ORDER — FENTANYL CITRATE (PF) 100 MCG/2ML IJ SOLN
INTRAMUSCULAR | Status: AC
  Filled 2015-05-23: qty 6

## 2015-05-23 NOTE — Procedures (Signed)
Technically successful US guided biopsy of indeterminate hypermetabolic nodule within the subcutaneous tissues of the right upper abdominal wall.   No immediate complications.   Ronny Bacon, MD Pager #: [phone]

## 2015-05-23 NOTE — H&P (Signed)
Chief Complaint: Patient was seen in consultation today for right abdominal wall mass at the request of Brunetta Genera  Referring Physician(s): Brunetta Genera  History of Present Illness: Ashley Green is a 81 y.o. female with history of colon cancer who is here today for an image guided biopsy of a right sided abdominal wall mass.  She most recently underwent a biopsy of a left paraspinal mass. 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  HPI and history was obtained today with the assistance of an interpreter.  Past Medical History  Diagnosis Date  . Hypertension   . Colon cancer   . Bowel obstruction   . GERD (gastroesophageal reflux disease) 10/15/2012    Past Surgical History  Procedure Laterality Date  . Colon surgery    . Colectomy  s/p colon cancer 2000    . Colostomy      Allergies: Review of patient's allergies indicates no known allergies.  Medications: Prior to Admission medications   Medication Sig Start Date End Date Taking? Authorizing Provider  Cyanocobalamin (VITAMIN B 12 PO) Take 1 tablet by mouth 2 (two) times daily.   Yes Historical Provider, MD  dexamethasone (DECADRON) 2 MG tablet Take 1 tablet (2 mg total) by mouth daily. 05/18/15  Yes Carlton Adam, PA-C  Docusate Calcium (STOOL SOFTENER PO) Take 1 tablet by mouth daily.   Yes Historical Provider, MD  magnesium citrate SOLN Take 0.5 Bottles by mouth once.   Yes Historical Provider, MD  morphine (ROXANOL) 20 MG/ML concentrated solution Take 0.25-0.5 mLs (5-10 mg total) by mouth every 2 (two) hours as needed for severe pain or breakthrough pain. 05/18/15  Yes Carlton Adam, PA-C  ondansetron (ZOFRAN) 8 MG tablet Take 0.5 tablets (4 mg total) by mouth every 6 (six) hours as needed for nausea or vomiting. 05/18/15  Yes Adrena E Johnson, PA-C  ondansetron  (ZOFRAN-ODT) 4 MG disintegrating tablet Take 1 tablet (4 mg total) by mouth every 8 (eight) hours as needed for nausea or vomiting. Patient taking differently: Take 4 mg by mouth daily as needed for nausea or vomiting.  04/20/15  Yes [name], MD  OxyCODONE (OXYCONTIN) 10 mg T12A 12 hr tablet Take 1 tablet (10 mg total) by mouth every 12 (twelve) hours. 05/18/15  Yes Adrena E Johnson, PA-C  polyethylene glycol powder (MIRALAX) powder Take 17 g by mouth 2 (two) times daily. Patient taking differently: Take 17 g by mouth daily.  05/02/15  Yes [place] N Rumley, DO  omeprazole (PRILOSEC) 40 MG capsule Take 1 capsule (40 mg total) by mouth daily. Patient not taking: Reported on 05/08/2015 10/15/12   Dayarmys Piloto de [name], MD  Ostomy Supplies (NATURA STOMAHESIVE MOLDABLE) [facility] 1 each by Does not apply route 2 (two) times daily. Patient not taking: Reported on 05/08/2015 02/24/13   Dayarmys Piloto de [name], MD  senna-docusate (SENNA S) 8.6-50 MG per tablet Take 2 tablets by mouth at bedtime. Patient not taking: Reported on 05/08/2015 05/05/15   Brunetta Genera, MD     Family History  Problem Relation Age of Onset  . Hodgkin's lymphoma Son   . Heart attack Mother   . Brain cancer Sister   . Pneumonia Father   . Alcoholism Brother   . Pneumonia Brother     Social History   Social History  . Marital Status: Single  Spouse Name: N/A  . Number of Children: 3  . Years of Education: N/A   Occupational History  . retired    Social History Main Topics  . Smoking status: Never Smoker   . Smokeless tobacco: Never Used  . Alcohol Use: No  . Drug Use: No  . Sexual Activity: No   Other Topics Concern  . None   Social History Narrative   Works at [employer].      Review of Systems  Constitutional: Positive for activity change, appetite change and fatigue. Negative for fever and chills.  Respiratory: Negative for cough and chest tightness.   Cardiovascular: Negative  for chest pain.  Gastrointestinal: Negative for nausea, vomiting, abdominal pain and abdominal distention.  Musculoskeletal: Positive for back pain and arthralgias.  Skin: Negative.   Neurological: Negative.   Psychiatric/Behavioral: Negative.     Vital Signs: BP 115/69 mmHg  Pulse 85  Temp(Src) 99.1 F (37.3 C)  Resp 16  SpO2 100%  Physical Exam  Constitutional: She is oriented to person, place, and time. She appears well-developed and well-nourished.  HENT:  Head: Normocephalic and atraumatic.  Eyes: EOM are normal.  Neck: Normal range of motion. Neck supple.  Cardiovascular: Normal rate, regular rhythm and normal heart sounds.   Pulmonary/Chest: Effort normal and breath sounds normal.  Abdominal: Soft. Bowel sounds are normal.  Small right sided nodule, about the size of a walnut. Non-tender. Mobile.  Musculoskeletal: Normal range of motion.  Neurological: She is alert and oriented to person, place, and time.  Skin: Skin is warm and dry.  Psychiatric: She has a normal mood and affect. Her behavior is normal. Judgment and thought content normal.  Vitals reviewed.   Mallampati Score:  MD Evaluation Airway: WNL Heart: WNL Abdomen: WNL Chest/ Lungs: WNL ASA  Classification: 3 Mallampati/Airway Score: Two  Imaging: Mr Kizzie Fantasia Contrast  05/10/2015   CLINICAL DATA:  Patient fell 1 month ago. Unsure if she struck head. History of colon cancer. Staging.  EXAM: MRI HEAD WITHOUT CONTRAST  TECHNIQUE: Multiplanar, multiecho pulse sequences of the brain and surrounding structures were obtained without intravenous contrast.  COMPARISON:  MR head 03/22/2011.  FINDINGS: Patient was unable to tolerate standard MRI brain imaging with and without contrast. No contrast could be administered. Only a few sequences were obtained. Some of these are motion degraded.  Sagittal T1 weighted images demonstrate no midline abnormality. There is mild cervical spondylosis without osseous  destruction.  On diffusion-weighted imaging, there is a small 3 mm focus of restricted diffusion, in the LEFT frontal subcortical white matter as seen on image 35 series 4, which could represent ischemia or small metastasis. No definite surrounding vasogenic edema on T2 imaging. No other definite areas of restricted diffusion are observed.  Generalized atrophy. Moderately advanced T2 and FLAIR hyperintensities representing small vessel disease. T2 and FLAIR axial images are insufficiently detailed, with too much motion, to allow detailed correlation with the LEFT frontal diffusion abnormality.  IMPRESSION: Suboptimal exam secondary to patient motion, and inability to tolerate the routine sequences required for pre and postcontrast imaging. No post infusion imaging was obtained.  3 mm focus of restricted diffusion LEFT frontal subcortical white matter could represent a small acute infarct or mass. See discussion above.  Generalized atrophy with small vessel disease.  Consider repeat imaging with sedation or pain medicine to allow complete assessment of the brain with and without contrast.  These results will be called to the ordering clinician or representative by  the Radiologist Assistant, and communication documented in the PACS or zVision Dashboard.   Electronically Signed   By: Staci Righter M.D.   On: 05/10/2015 17:15   Dg Esophagus  04/28/2015   CLINICAL DATA:  Difficulty swallowing liquids and solids. Weight loss. Lung mass and pancreatic mass.  EXAM: ESOPHOGRAM/BARIUM SWALLOW  TECHNIQUE: Single contrast examination was performed using water-soluble and thin barium.  FLUOROSCOPY TIME:  Radiation Exposure Index (as provided by the fluoroscopic device):  If the device does not provide the exposure index:  Fluoroscopy Time:  1 minutes and 45 seconds  Number of Acquired Images:  COMPARISON:  Chest CT 04/19/2015  FINDINGS: Mid esophageal diverticuli are noted. No mass or obvious stricture. No hiatal hernia or GE  reflux.  IMPRESSION: Mid esophageal diverticuli.  No esophageal mass or stricture.  No hiatal hernia or GE reflux.   Electronically Signed   By: Marijo Sanes M.D.   On: 04/28/2015 13:06   Nm Pet Image Initial (pi) Skull Base To Thigh  05/04/2015   CLINICAL DATA:  Initial treatment strategy for right lower lobe lung mass with pancreatic lesion and multiple skin nodules. History of colon cancer.  EXAM: NUCLEAR MEDICINE PET SKULL BASE TO THIGH  TECHNIQUE: 4.95 mCi F-18 FDG was injected intravenously. Full-ring PET imaging was performed from the skull base to thigh after the radiotracer. CT data was obtained and used for attenuation correction and anatomic localization.  FASTING BLOOD GLUCOSE:  Value: 148 mg/dl  COMPARISON:  CTs of the chest, abdomen and pelvis 04/19/2015.  FINDINGS: NECK  No hypermetabolic cervical lymph nodes are identified.There are no lesions of the pharyngeal mucosal space. There is mildly asymmetric activity associated with the muscles of phonation, within physiologic limits.  CHEST  The large right upper lobe mass is hypermetabolic. This measures 5.0 x 3.9 cm on image 17 and has an SUV max of 21.8. This is contiguous with a hypermetabolic low right paratracheal node measuring 11 mm on image 63. No hypermetabolic hilar nodal activity. There is a hypermetabolic left paraesophageal node inferior to the left hilum on image 69. In addition, there are several hypermetabolic left paraspinal soft tissue nodules posterior to the descending thoracic aorta. These are noted adjacent to the left T7-8, T8-9 and T9-10 foramina and demonstrate mild foraminal extension. 4 mm left lower lobe pulmonary nodule on image 50 is too small to evaluate with PET.  ABDOMEN/PELVIS  There are bilateral adrenal metastases with hypermetabolic activity. The right adrenal lesion is larger, measuring 4.1 x 2.6 and has an SUV max of 22.7. There are no lesions within the liver or spleen. However, there are 2 hypermetabolic  pancreatic lesions, the largest within the pancreatic head demonstrated on prior CT and having an SUV max of 22.4. There is a large hypermetabolic nodal mass in the right ileocolonic mesenteric, measuring 4.9 x 3.8 cm on image 141 and having an SUV max of 21.1. There are other hypermetabolic mesenteric nodes within the false pelvis. There is a hypermetabolic mass within the left iliopsoas muscle. There is also a hypermetabolic nodule anteriorly in the omentum (image 114. There is hypermetabolic subcutaneous nodule in the right lateral upper abdominal wall (image 113). This has enlarged compared with the recent CT. There is persistent dilated small bowel in the pelvis.  SKELETON  There is no hypermetabolic activity to suggest osseous metastatic disease. There is nonunion of a fracture involving the right femoral greater trochanter. As above, there are hypermetabolic paraspinal masses in the mid left thoracic  region with foraminal extension. There is a hypermetabolic soft tissue mass within the left erector spinae musculature at the same levels, demonstrating an SUV max of 19.2.  IMPRESSION: 1. There are multiple hypermetabolic lesions within the chest, abdomen and pelvis. The dominant right upper lobe mass is morphologically suggestive of primary lung cancer. 2. There are multiple hypermetabolic metastases involving the adrenal glands, the pancreas, several abdominal pelvic lymph nodes/mesenteric nodules, a subcutaneous nodule in the right anterior abdominal wall, and multiple paraspinal soft tissue nodules in the left thoracic region. The nodal/mesenteric findings in the abdomen and pelvis are more suggestive of metastatic colon cancer. 3. Stable small bowel dilatation in the pelvis.   Electronically Signed   By: Richardean Sale M.D.   On: 05/04/2015 10:30   US Biopsy  05/08/2015   CLINICAL DATA:  81 year old female with a history of colon cancer and now with multifocal metastatic disease of uncertain etiology.  There is a larger lesion in the right upper lobe of the lung as well as a lesion in the pancreatic head. Differential considerations include primary lung, pancreas and colon cancer. Ultrasound-guided core biopsy is warranted to facilitate tissue diagnosis.  EXAM: ULTRASOUND BIOPSY CORE LIVER  Date: 05/08/2015  PROCEDURE: 1. Ultrasound-guided core biopsy of paraspinal soft tissue mass. Interventional Radiologist:  Criselda Peaches, MD  ANESTHESIA/SEDATION: Moderate (conscious) sedation was used. 0.5 mg Versed, 25 mcg Fentanyl were administered intravenously. The patient's vital signs were monitored continuously by radiology nursing throughout the procedure.  Sedation Time: 8 minutes  MEDICATIONS: None additional  TECHNIQUE: Informed consent was obtained from the patient following explanation of the procedure, risks, benefits and alternatives. The patient understands, agrees and consents for the procedure. All questions were addressed. A time out was performed.  The left paraspinal soft tissues were interrogated with ultrasound. An approximately 4.2 by 2.0 complex hypoechoic soft tissue mass can be identified. A suitable skin entry site was selected and marked. The region was then sterilely prepped and draped in the standard fashion using Betadine skin prep. Local anesthesia was attained by infiltration with 1% lidocaine. A small dermatotomy was made. Under real-time sonographic guidance, multiple 18 gauge core biopsies were obtained using the bio Pince automated biopsy device. Biopsy specimens were placed in saline and delivered to the lab for further evaluation.  Post biopsy imaging demonstrates no evidence of complication. The biopsy tracts remain visible within the mass. The patient tolerated the procedure well.  COMPLICATIONS: None  Estimated blood loss: 0  IMPRESSION: Technically successful ultrasound-guided core biopsy of left paraspinal mass.  Signed,  Criselda Peaches, MD  [facility]  [facility]   Electronically Signed   By: Jacqulynn Cadet M.D.   On: 05/08/2015 11:29    Labs:  CBC:  Recent Labs  05/04/15 1017 05/08/15 0705 05/18/15 1058 05/23/15 1140  WBC 12.8* 10.6* 10.2 15.1*  HGB 9.2* 8.3* 8.1* 9.9*  HCT 26.6* 24.6* 23.7* 28.9*  PLT 555* 560* 613* 549*    COAGS:  Recent Labs  05/08/15 0705 05/23/15 1140  INR 1.16 1.22  APTT 36 33    BMP:  Recent Labs  04/17/15 1610 05/04/15 1017 05/18/15 1058  NA 137 131* 130*  K 4.6 3.9 4.6  CL 100  --   --   CO2 '26 24 23  '$ GLUCOSE 93 175* 173*  BUN 17 13.4 9.7  CALCIUM 9.3 9.7 9.6  CREATININE 0.57* 0.7 0.7    LIVER FUNCTION TESTS:  Recent Labs  04/17/15 1610 05/04/15 1017 05/18/15 1058  BILITOT 0.2 0.59 0.43  AST '11 10 15  '$ ALT '12 15 22  '$ ALKPHOS 76 96 96  PROT 6.6 7.5 6.8  ALBUMIN 3.7 3.1* 2.6*    TUMOR MARKERS:  Recent Labs  04/21/15 1147  CEA 3.6  CA199 247.6*    Assessment and Plan:  Newly diagnoses 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  I greatly enjoyed meeting Samirah Scarpati and look forward to participating in their care.  A copy of this report was sent to the requesting provider on this date.  Signed: Murrell Redden PA-C 05/23/2015, 12:08 PM   I spent a total of 15 Minutes in face to face in clinical consultation, greater than 50% of which was counseling/coordinating care for image guided biopsy of abdominal wall mass.

## 2015-05-24 ENCOUNTER — Encounter: Payer: Self-pay | Admitting: *Deleted

## 2015-05-24 ENCOUNTER — Other Ambulatory Visit (HOSPITAL_BASED_OUTPATIENT_CLINIC_OR_DEPARTMENT_OTHER): Payer: Medicaid Other

## 2015-05-24 ENCOUNTER — Encounter: Payer: Self-pay | Admitting: Hematology

## 2015-05-24 ENCOUNTER — Ambulatory Visit (HOSPITAL_BASED_OUTPATIENT_CLINIC_OR_DEPARTMENT_OTHER): Payer: Medicaid Other

## 2015-05-24 ENCOUNTER — Ambulatory Visit (HOSPITAL_BASED_OUTPATIENT_CLINIC_OR_DEPARTMENT_OTHER): Payer: Medicaid Other | Admitting: Hematology

## 2015-05-24 VITALS — BP 139/62 | HR 94 | Temp 98.0°F | Resp 18 | Ht 59.0 in | Wt 97.0 lb

## 2015-05-24 DIAGNOSIS — G893 Neoplasm related pain (acute) (chronic): Secondary | ICD-10-CM | POA: Diagnosis not present

## 2015-05-24 DIAGNOSIS — C7971 Secondary malignant neoplasm of right adrenal gland: Secondary | ICD-10-CM

## 2015-05-24 DIAGNOSIS — D75839 Thrombocytosis, unspecified: Secondary | ICD-10-CM

## 2015-05-24 DIAGNOSIS — C7989 Secondary malignant neoplasm of other specified sites: Secondary | ICD-10-CM | POA: Diagnosis not present

## 2015-05-24 DIAGNOSIS — D630 Anemia in neoplastic disease: Secondary | ICD-10-CM | POA: Diagnosis not present

## 2015-05-24 DIAGNOSIS — C7972 Secondary malignant neoplasm of left adrenal gland: Secondary | ICD-10-CM

## 2015-05-24 DIAGNOSIS — D72829 Elevated white blood cell count, unspecified: Secondary | ICD-10-CM

## 2015-05-24 DIAGNOSIS — C772 Secondary and unspecified malignant neoplasm of intra-abdominal lymph nodes: Secondary | ICD-10-CM

## 2015-05-24 DIAGNOSIS — C3490 Malignant neoplasm of unspecified part of unspecified bronchus or lung: Secondary | ICD-10-CM

## 2015-05-24 DIAGNOSIS — D509 Iron deficiency anemia, unspecified: Secondary | ICD-10-CM

## 2015-05-24 DIAGNOSIS — D473 Essential (hemorrhagic) thrombocythemia: Secondary | ICD-10-CM

## 2015-05-24 DIAGNOSIS — R7989 Other specified abnormal findings of blood chemistry: Secondary | ICD-10-CM

## 2015-05-24 LAB — COMPREHENSIVE METABOLIC PANEL (CC13)
ALT: 18 U/L (ref 0–55)
AST: 13 U/L (ref 5–34)
Albumin: 2.5 g/dL — ABNORMAL LOW (ref 3.5–5.0)
Alkaline Phosphatase: 110 U/L (ref 40–150)
Anion Gap: 10 meq/L (ref 3–11)
BUN: 16.3 mg/dL (ref 7.0–26.0)
CO2: 26 meq/L (ref 22–29)
Calcium: 9.6 mg/dL (ref 8.4–10.4)
Chloride: 98 meq/L (ref 98–109)
Creatinine: 0.7 mg/dL (ref 0.6–1.1)
EGFR: 80 ml/min/1.73 m2 — ABNORMAL LOW
Glucose: 163 mg/dL — ABNORMAL HIGH (ref 70–140)
Potassium: 5.1 meq/L (ref 3.5–5.1)
Sodium: 134 meq/L — ABNORMAL LOW (ref 136–145)
Total Bilirubin: 0.35 mg/dL (ref 0.20–1.20)
Total Protein: 6.8 g/dL (ref 6.4–8.3)

## 2015-05-24 LAB — CBC & DIFF AND RETIC
BASO%: 0.1 % (ref 0.0–2.0)
Basophils Absolute: 0 10*3/uL (ref 0.0–0.1)
EOS%: 0.1 % (ref 0.0–7.0)
Eosinophils Absolute: 0 10*3/uL (ref 0.0–0.5)
HCT: 29.8 % — ABNORMAL LOW (ref 34.8–46.6)
HGB: 10.1 g/dL — ABNORMAL LOW (ref 11.6–15.9)
Immature Retic Fract: 7.9 % (ref 1.60–10.00)
LYMPH%: 2.6 % — ABNORMAL LOW (ref 14.0–49.7)
MCH: 25.4 pg (ref 25.1–34.0)
MCHC: 33.9 g/dL (ref 31.5–36.0)
MCV: 74.9 fL — ABNORMAL LOW (ref 79.5–101.0)
MONO#: 1.5 10*3/uL — ABNORMAL HIGH (ref 0.1–0.9)
MONO%: 8.9 % (ref 0.0–14.0)
NEUT#: 15.2 10*3/uL — ABNORMAL HIGH (ref 1.5–6.5)
NEUT%: 88.3 % — ABNORMAL HIGH (ref 38.4–76.8)
Platelets: 501 10*3/uL — ABNORMAL HIGH (ref 145–400)
RBC: 3.98 10*6/uL (ref 3.70–5.45)
RDW: 16.6 % — ABNORMAL HIGH (ref 11.2–14.5)
Retic %: 1.96 % (ref 0.70–2.10)
Retic Ct Abs: 78.01 10*3/uL (ref 33.70–90.70)
WBC: 17.2 10*3/uL — ABNORMAL HIGH (ref 3.9–10.3)
lymph#: 0.4 10*3/uL — ABNORMAL LOW (ref 0.9–3.3)

## 2015-05-24 LAB — TSH CHCC: TSH: 10.054 m[IU]/L — AB (ref 0.308–3.960)

## 2015-05-24 MED ORDER — SODIUM CHLORIDE 0.9 % IV SOLN
Freq: Once | INTRAVENOUS | Status: AC
  Administered 2015-05-24: 12:00:00 via INTRAVENOUS

## 2015-05-24 MED ORDER — SODIUM CHLORIDE 0.9 % IV SOLN: 3.0000 mg/kg | Freq: Once | INTRAVENOUS | Status: DC

## 2015-05-24 NOTE — Progress Notes (Signed)
.    Hematology oncology CONSULT NOTE Date of service: 05/24/2015   Patient Care Team: Lorna Few, DO as PCP - General  CHIEF COMPLAINTS: Follow-up for newly diagnosed Metastatic poorly differentiated carcinoma with sarcomatoid features.  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   HISTORY OF PRESENTING ILLNESS: Please see my previous clinic note for details of initial presentation.  Interval History  Ashley Green is here for follow-up with her daughter. She notes that she is feeling a little stronger after getting the blood transfusion yesterday. She has started taking her OxyContin in addition to her when necessary liquid morphine and notes that the pain is better controlled and that she did not wake up with significant pain to small morning. Still feeling short of breath. Will be seen in radiation oncology tomorrow morning to be evaluated for palliative radiation to her paraspinal back masses which are painful and to her dominant lung mass which appears to be causing some airway and esophageal compromise.   She was due to start Nivolumab today but when she was in the infusion room I was informed that the approval process was not completed yet. She was informed this and will be rescheduled for further treatments.    MEDICAL HISTORY:  Past Medical History  Diagnosis Date  . Hypertension   . Colon cancer   . Bowel obstruction   . GERD (gastroesophageal reflux disease) 10/15/2012    SURGICAL HISTORY: Past Surgical History  Procedure Laterality Date  . Colon surgery    . Colectomy  s/p colon cancer 2000    . Colostomy      SOCIAL HISTORY: Social History   Social History  . Marital Status: Single    Spouse Name: N/A  . Number of Children: 3  . Years of Education: N/A   Occupational History  . retired    Social History Main Topics  . Smoking status: Never Smoker   . Smokeless tobacco: Never Used  . Alcohol Use: No   . Drug Use: No  . Sexual Activity: No   Other Topics Concern  . Not on file   Social History Narrative   Works at [employer].     FAMILY HISTORY: Family History  Problem Relation Age of Onset  . Hodgkin's lymphoma Son   . Heart attack Mother   . Brain cancer Sister   . Pneumonia Father   . Alcoholism Brother   . Pneumonia Brother     ALLERGIES:  has No Known Allergies.  MEDICATIONS:  Current Outpatient Prescriptions  Medication Sig Dispense Refill  . Cyanocobalamin (VITAMIN B 12 PO) Take 1 tablet by mouth 2 (two) times daily.    Marland Kitchen dexamethasone (DECADRON) 2 MG tablet Take 1 tablet (2 mg total) by mouth daily. 30 tablet 0  . Docusate Calcium (STOOL SOFTENER PO) Take 1 tablet by mouth daily.    . magnesium citrate SOLN Take 0.5 Bottles by mouth once.    . morphine (ROXANOL) 20 MG/ML concentrated solution Take 0.25-0.5 mLs (5-10 mg total) by mouth every 2 (two) hours as needed for severe pain or breakthrough pain. 200 mL 0  . omeprazole (PRILOSEC) 40 MG capsule Take 1 capsule (40 mg total) by mouth daily. (Patient not taking: Reported on 05/08/2015) 30 capsule 3  . ondansetron (ZOFRAN) 8 MG tablet Take 0.5 tablets (4 mg total) by mouth every 6 (six) hours as needed for nausea or vomiting. 30 tablet 3  . ondansetron (ZOFRAN-ODT) 4 MG disintegrating tablet Take  1 tablet (4 mg total) by mouth every 8 (eight) hours as needed for nausea or vomiting. (Patient taking differently: Take 4 mg by mouth daily as needed for nausea or vomiting. ) 20 tablet 0  . Ostomy Supplies (NATURA STOMAHESIVE MOLDABLE) WAFR 1 each by Does not apply route 2 (two) times daily. (Patient not taking: Reported on 05/08/2015) 60 Wafer 12  . OxyCODONE (OXYCONTIN) 10 mg T12A 12 hr tablet Take 1 tablet (10 mg total) by mouth every 12 (twelve) hours. 60 tablet 0  . polyethylene glycol powder (MIRALAX) powder Take 17 g by mouth 2 (two) times daily. (Patient taking differently: Take 17 g by mouth daily. ) 255 g  3  . senna-docusate (SENNA S) 8.6-50 MG per tablet Take 2 tablets by mouth at bedtime. (Patient not taking: Reported on 05/08/2015) 60 tablet 1   No current facility-administered medications for this visit.    REVIEW OF SYSTEMS:   Constitutional: Denies fevers, chills or abnormal night sweats Eyes: Denies blurriness of vision, double vision or watery eyes Ears, nose, mouth, throat, and face: Denies mucositis or sore throat Respiratory: Denies cough, dyspnea or wheezes Cardiovascular: Denies palpitation, chest discomfort or lower extremity swelling Gastrointestinal:  Denies nausea, heartburn or change in bowel habits Skin: Denies abnormal skin rashes Lymphatics: Denies new lymphadenopathy or easy bruising Neurological:Denies numbness, tingling or new weaknesses Behavioral/Psych: Mood is stable, no new changes  All other systems were reviewed with the patient and are negative.   PHYSICAL EXAMINATION: ECOG PERFORMANCE STATUS: 1 - Symptomatic but completely ambulatory  Filed Vitals:   05/24/15 1019  BP: 139/62  Pulse: 94  Temp: 98 F (36.7 C)  Resp: 18   Filed Weights   05/24/15 1019  Weight: 97 lb (43.999 kg)    GENERAL:alert, mild distress due to left upper back pain. SKIN: Palpable left upper back mass is firm with some tenderness to palpation no redness. One dominant and a few small right abdominal wall nodules. EYES: normal, conjunctiva mild pallor, sclera clear anicteric  OROPHARYNX:no exudate, no erythema and lips, buccal mucosa, and tongue normal  NECK: supple, thyroid normal size, non-tender, without nodularity LYMPH:  no palpable lymphadenopathy in cervical, axillary or inguinal LUNGS: clear to auscultation with normal breathing effort HEART: regular rate & rhythm and no murmurs and no lower extremity edema ABDOMEN:abdomen soft, non-tender and normal bowel sounds Musculoskeletal:no cyanosis of digits and no clubbing  PSYCH: alert & oriented x 3 with fluent  speech NEURO: no focal motor/sensory deficits  LABORATORY DATA:  . CBC Latest Ref Rng 05/24/2015 05/23/2015 05/18/2015  WBC 3.9 - 10.3 10e3/uL 17.2(H) 15.1(H) 10.2  Hemoglobin 11.6 - 15.9 g/dL 10.1(L) 9.9(L) 8.1(L)  Hematocrit 34.8 - 46.6 % 29.8(L) 28.9(L) 23.7(L)  Platelets 145 - 400 10e3/uL 501(H) 549(H) 613(H)    Recent Labs  04/17/15 1610 05/04/15 1017 05/18/15 1058 05/24/15 0949  NA 137 131* 130* 134*  K 4.6 3.9 4.6 5.1  CL 100  --   --   --   CO2 '26 24 23 26  ' GLUCOSE 93 175* 173* 163*  BUN 17 13.4 9.7 16.3  CREATININE 0.57* 0.7 0.7 0.7  CALCIUM 9.3 9.7 9.6 9.6  PROT 6.6 7.5 6.8 6.8  ALBUMIN 3.7 3.1* 2.6* 2.5*  AST '11 10 15 13  ' ALT '12 15 22 18  ' ALKPHOS 76 96 96 110  BILITOT 0.2 0.59 0.43 0.35   . Lab Results  Component Value Date   CA199 247.6* 04/21/2015   . Lab Results  Component Value Date   CEA 3.6 04/21/2015    . Lab Results  Component Value Date   LDH 186 05/04/2015      RADIOGRAPHIC STUDIES: I have personally reviewed the radiological images as listed and agreed with the findings in the report. Mr Jeri Cos Wo Contrast  05/10/2015   CLINICAL DATA:  Patient fell 1 month ago. Unsure if she struck head. History of colon cancer. Staging.  EXAM: MRI HEAD WITHOUT CONTRAST  TECHNIQUE: Multiplanar, multiecho pulse sequences of the brain and surrounding structures were obtained without intravenous contrast.  COMPARISON:  MR head 03/22/2011.  FINDINGS: Patient was unable to tolerate standard MRI brain imaging with and without contrast. No contrast could be administered. Only a few sequences were obtained. Some of these are motion degraded.  Sagittal T1 weighted images demonstrate no midline abnormality. There is mild cervical spondylosis without osseous destruction.  On diffusion-weighted imaging, there is a small 3 mm focus of restricted diffusion, in the LEFT frontal subcortical white matter as seen on image 35 series 4, which could represent ischemia or small  metastasis. No definite surrounding vasogenic edema on T2 imaging. No other definite areas of restricted diffusion are observed.  Generalized atrophy. Moderately advanced T2 and FLAIR hyperintensities representing small vessel disease. T2 and FLAIR axial images are insufficiently detailed, with too much motion, to allow detailed correlation with the LEFT frontal diffusion abnormality.  IMPRESSION: Suboptimal exam secondary to patient motion, and inability to tolerate the routine sequences required for pre and postcontrast imaging. No post infusion imaging was obtained.  3 mm focus of restricted diffusion LEFT frontal subcortical white matter could represent a small acute infarct or mass. See discussion above.  Generalized atrophy with small vessel disease.  Consider repeat imaging with sedation or pain medicine to allow complete assessment of the brain with and without contrast.  These results will be called to the ordering clinician or representative by the Radiologist Assistant, and communication documented in the PACS or zVision Dashboard.   Electronically Signed   By: Staci Righter M.D.   On: 05/10/2015 17:15   Dg Esophagus  04/28/2015   CLINICAL DATA:  Difficulty swallowing liquids and solids. Weight loss. Lung mass and pancreatic mass.  EXAM: ESOPHOGRAM/BARIUM SWALLOW  TECHNIQUE: Single contrast examination was performed using water-soluble and thin barium.  FLUOROSCOPY TIME:  Radiation Exposure Index (as provided by the fluoroscopic device):  If the device does not provide the exposure index:  Fluoroscopy Time:  1 minutes and 45 seconds  Number of Acquired Images:  COMPARISON:  Chest CT 04/19/2015  FINDINGS: Mid esophageal diverticuli are noted. No mass or obvious stricture. No hiatal hernia or GE reflux.  IMPRESSION: Mid esophageal diverticuli.  No esophageal mass or stricture.  No hiatal hernia or GE reflux.   Electronically Signed   By: Marijo Sanes M.D.   On: 04/28/2015 13:06   Nm Pet Image Initial  (pi) Skull Base To Thigh  05/04/2015   CLINICAL DATA:  Initial treatment strategy for right lower lobe lung mass with pancreatic lesion and multiple skin nodules. History of colon cancer.  EXAM: NUCLEAR MEDICINE PET SKULL BASE TO THIGH  TECHNIQUE: 4.95 mCi F-18 FDG was injected intravenously. Full-ring PET imaging was performed from the skull base to thigh after the radiotracer. CT data was obtained and used for attenuation correction and anatomic localization.  FASTING BLOOD GLUCOSE:  Value: 148 mg/dl  COMPARISON:  CTs of the chest, abdomen and pelvis 04/19/2015.  FINDINGS: NECK  No hypermetabolic  cervical lymph nodes are identified.There are no lesions of the pharyngeal mucosal space. There is mildly asymmetric activity associated with the muscles of phonation, within physiologic limits.  CHEST  The large right upper lobe mass is hypermetabolic. This measures 5.0 x 3.9 cm on image 17 and has an SUV max of 21.8. This is contiguous with a hypermetabolic low right paratracheal node measuring 11 mm on image 63. No hypermetabolic hilar nodal activity. There is a hypermetabolic left paraesophageal node inferior to the left hilum on image 69. In addition, there are several hypermetabolic left paraspinal soft tissue nodules posterior to the descending thoracic aorta. These are noted adjacent to the left T7-8, T8-9 and T9-10 foramina and demonstrate mild foraminal extension. 4 mm left lower lobe pulmonary nodule on image 50 is too small to evaluate with PET.  ABDOMEN/PELVIS  There are bilateral adrenal metastases with hypermetabolic activity. The right adrenal lesion is larger, measuring 4.1 x 2.6 and has an SUV max of 22.7. There are no lesions within the liver or spleen. However, there are 2 hypermetabolic pancreatic lesions, the largest within the pancreatic head demonstrated on prior CT and having an SUV max of 22.4. There is a large hypermetabolic nodal mass in the right ileocolonic mesenteric, measuring 4.9 x 3.8 cm  on image 141 and having an SUV max of 21.1. There are other hypermetabolic mesenteric nodes within the false pelvis. There is a hypermetabolic mass within the left iliopsoas muscle. There is also a hypermetabolic nodule anteriorly in the omentum (image 114. There is hypermetabolic subcutaneous nodule in the right lateral upper abdominal wall (image 113). This has enlarged compared with the recent CT. There is persistent dilated small bowel in the pelvis.  SKELETON  There is no hypermetabolic activity to suggest osseous metastatic disease. There is nonunion of a fracture involving the right femoral greater trochanter. As above, there are hypermetabolic paraspinal masses in the mid left thoracic region with foraminal extension. There is a hypermetabolic soft tissue mass within the left erector spinae musculature at the same levels, demonstrating an SUV max of 19.2.  IMPRESSION: 1. There are multiple hypermetabolic lesions within the chest, abdomen and pelvis. The dominant right upper lobe mass is morphologically suggestive of primary lung cancer. 2. There are multiple hypermetabolic metastases involving the adrenal glands, the pancreas, several abdominal pelvic lymph nodes/mesenteric nodules, a subcutaneous nodule in the right anterior abdominal wall, and multiple paraspinal soft tissue nodules in the left thoracic region. The nodal/mesenteric findings in the abdomen and pelvis are more suggestive of metastatic colon cancer. 3. Stable small bowel dilatation in the pelvis.   Electronically Signed   By: Richardean Sale M.D.   On: 05/04/2015 10:30   US Biopsy  05/23/2015   INDICATION: History of metastatic poorly differentiated lung carcinoma with sarcomatoid features, post ultrasound-guided biopsy of dominant hypermetabolic nodule within the subcutaneous tissues of the back performed on 05/08/2015.  Request made for repeat biopsy of an additional hypermetabolic subcutaneous nodule within the subcutaneous tissues of  the right upper abdominal quadrant for EGFR mutation testing, alk gene rearrangement and Ros 1 testing.  EXAM: ULTRASOUND GUIDED BIOPSY OF HYPERMETABOLIC NODULE WITHIN THE SUBCUTANEOUS TISSUES WITHIN THE RIGHT UPPER ABDOMEN  COMPARISON:  PET-CT - 05/04/2015; ultrasound-guided biopsy of dominant hypermetabolic mass with the subcutaneous tissues of the back - 05/08/2015  MEDICATIONS: None  ANESTHESIA/SEDATION: Conscious sedation was achieved with intravenous Versed and Fentanyl.  Total Moderate Sedation time  15 minutes  COMPLICATIONS: None immediate  PROCEDURE: Informed written consent  was obtained from the patient and the patient's daughter via the use of a medical translator after a discussion of the risks, benefits and alternatives to treatment. The patient understands and consents the procedure. A timeout was performed prior to the initiation of the procedure.  Ultrasound scanning was performed of the right upper abdominal quadrant demonstrates a well-defined punctate approximately 1.3 x 1.2 cm hypoechoic nodule within the subcutaneous tissues of the right anterior lateral abdomen compatible with the hypermetabolic nodule seen on preceding PET-CT.  The right upper abdominal quadrant was prepped and draped in the usual sterile fashion. The overlying soft tissues were anesthetized with 1% lidocaine with epinephrine. This was followed by 6 core biopsies with an 18 gauge core device under direct ultrasound guidance. Multiple ultrasound images were saved for procedural documentation purposes.  Hemostasis was achieved with manual compression. Post procedural scanning was negative for definitive area of hemorrhage or additional complication. A dressing was placed. The patient tolerated the procedure well without immediate post procedural complication.  IMPRESSION: Technically successful ultrasound guided core needle biopsy of hypermetabolic nodule within the subcutaneous tissues of the right anterior lateral abdomen.    Electronically Signed   By: Sandi Mariscal M.D.   On: 05/23/2015 15:04   US Biopsy  05/08/2015   CLINICAL DATA:  81 year old female with a history of colon cancer and now with multifocal metastatic disease of uncertain etiology. There is a larger lesion in the right upper lobe of the lung as well as a lesion in the pancreatic head. Differential considerations include primary lung, pancreas and colon cancer. Ultrasound-guided core biopsy is warranted to facilitate tissue diagnosis.  EXAM: ULTRASOUND BIOPSY CORE LIVER  Date: 05/08/2015  PROCEDURE: 1. Ultrasound-guided core biopsy of paraspinal soft tissue mass. Interventional Radiologist:  Criselda Peaches, MD  ANESTHESIA/SEDATION: Moderate (conscious) sedation was used. 0.5 mg Versed, 25 mcg Fentanyl were administered intravenously. The patient's vital signs were monitored continuously by radiology nursing throughout the procedure.  Sedation Time: 8 minutes  MEDICATIONS: None additional  TECHNIQUE: Informed consent was obtained from the patient following explanation of the procedure, risks, benefits and alternatives. The patient understands, agrees and consents for the procedure. All questions were addressed. A time out was performed.  The left paraspinal soft tissues were interrogated with ultrasound. An approximately 4.2 by 2.0 complex hypoechoic soft tissue mass can be identified. A suitable skin entry site was selected and marked. The region was then sterilely prepped and draped in the standard fashion using Betadine skin prep. Local anesthesia was attained by infiltration with 1% lidocaine. A small dermatotomy was made. Under real-time sonographic guidance, multiple 18 gauge core biopsies were obtained using the bio Pince automated biopsy device. Biopsy specimens were placed in saline and delivered to the lab for further evaluation.  Post biopsy imaging demonstrates no evidence of complication. The biopsy tracts remain visible within the mass. The patient  tolerated the procedure well.  COMPLICATIONS: None  Estimated blood loss: 0  IMPRESSION: Technically successful ultrasound-guided core biopsy of left paraspinal mass.  Signed,  Criselda Peaches, MD  [facility]  [facility]   Electronically Signed   By: Jacqulynn Cadet M.D.   On: 05/08/2015 11:29    ASSESSMENT & PLAN:   Ashley Green is a very pleasant 81 year old Hispanic female in good overall health with ECOG performance status of 1 and previous history of colon cancer in 2000 and 2001 now with  #1 Stage IV Metastatic poorly differentiated lung carcinoma with sarcomatoid  features with  mediastinal adenopathy, metastases to bilateral adrenal glands, paraspinal muscles and pancreas as well as abdominal lymph nodes and skin.  CEA level within normal limits. CA-19-9 level somewhat elevated likely due to metastases to the pancreas and less consistent with primary pancreatic metastatic cancer. LDH level within normal limits. Good ECOG performance status is 1 #2  Painful metastases to the spinal muscles over upper back. #3 Shortness of breath due to her lung mass causing some airway compression. #4  Symptomatic anemia due to her metastatic cancer.  Plan  -Given the relatively limited chemosensitivity of sarcomatoid lung cancer coupled with the patient's age and chemotherapy tolerability plus increasing data for consideration of moving immunotherapies for non-small cell lung cancers to the frontline setting and preclinical data in both sarcomatoid lung cancer and sarcomatoid renal cancer showing significantly higher PDL 1 expression than in non-sarcomatoid non-small cell lung cancer -I recommended using Nivolumab in the first line setting. I discussed the pros and cons of this medication and the patient was agreeable to proceeding with this. -We had to discontinue the infusion appointment today due to pending approval of this medication with the patient's  insurance company. -Patient had a biopsy of her subcutaneous nodule which we shall send out for EGFR mutation, ALK gene rearrangement and ROs-1 testing. -Still pending serum EGFR mutation analysis and guardant testing. -Patient will be seeing radiation oncology tomorrow to hopefully get started soon on palliative RT for her significantly symptomatic paraspinal back lesions which are causing localized back pain as well as left-sided neuropathic chest wall pain due to nerve compression's and to also consider palliative RT to her dominant lung mass which is causing some obstructive airway and esophageal symptoms.  #5 Pain due to her cancer Plan -Continue OxyContin and when necessary sublingual morphine. -Senna S for bowel prophylaxis -Zofran when necessary for nausea. -Radiation therapy for pain control.  #6 Microcytic anemia due to anemia of chronic disease ferritin level 171. Patient transfused yesterday with some improvement in symptoms .  #7 Bilateral adrenal metastases with high risk for adrenal insufficiency. No overt hypotension today and no issues with hypoglycemia at this time. Plan -continue on low dexamethasone 2 mg by mouth daily for fatigue as well as appetite.  #8 dysphagia -no intraluminal masses on esophagogram . This symptom is likely from extrinsic compression from right upper lobe lung mass. -Might consider palliative radiation therapy to address this.Radiation oncology has been consulted   #9 history of colorectal cancer diagnosed in 2000 treated with surgery followed by chemotherapy and radiation for her daughter. She had recurrence in 2001 when she was treated with further surgery and intraoperative radiation. Does not report having had any colonoscopy since then. CEA level currently within normal limits. Notes some constipation. No overt blood in the stools or melena. Plan -still awaiting outside records from [facility]  #10 Neutrophilia and  thrombocytosis this is likely paraneoplastic related to her metastatic malignancy.Some increase likely due to steroids  No fevers or chills. Low threshold for treating with antibiotics if she develops fevers due to high risk of postobstructive pneumonia.  I appreciate the privilege of taking care of this wonderful patient.  All questions were answered. The patient knows to call the clinic with any problems, questions or concerns. I spent 35 minutes counseling the patient face to face. The total time spent in the appointment was 45 minutes and more than 50% was on counseling.    Sullivan Lone MD [place] AAHIVMS [facility] Hematology/Oncology  Physician [facility]  (Office):       [phone] (Work cell):  [phone] (Fax):           [phone]

## 2015-05-24 NOTE — Progress Notes (Signed)
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 CEA level within normal limits. CA-19-9 level somewhat elevated likely due to metastases to the pancreas and less consistent with primary pancreatic metastatic cancer. LDH level within normal limits. Good ECOG performance status is 1 #2 Painful metastases to the spinal muscles over upper back. #3 Shortness of breath due to her lung mass causing some airway compression. #4 Symptomatic anemia due to her metastatic cancer.  05/23/15 Diagnosis Soft Tissue Needle Core Biopsy, Hypermetabolic abdominal wall - POORLY DIFFERENTIATED CARCINOMA WITH SARCOMATOID FEATURES. Microscopic Comment  05/08/15 Soft Tissue Needle Core Biopsy, right paraspinal - POORLY DIFFERENTIATED CARCINOMA WITH PARTIAL SARCOMATOID FEATURES. - SEE DESCRIPTION.   Past/Anticipated interventions by surgeon, if any:  Past/Anticipated interventions by medical oncology, if any: referral for palliative radiation to her paraspinal metastases are causing significant back pain as well as chest wall pain due to nerve root overt impingement. Consideration of palliative RT to her lung mass to help with airway obstruction related symptoms. To start Nivolumab once incurance approval   Weight changes, if any: ~ 10 lb weight lost in the past 2 years  Bowel/Bladder complaints, if any: No  Nausea / Vomiting, if any: No    Pain issues, if any:  Level 9 in the lumbar spine and lower abdomen  Any blood per rectum: No  SAFETY ISSUES:  Prior radiation? Radiation Therapy for colorectal Cancer in 2000, then intraoperative radiation in 2001  Pacemaker/ICD?No   Possible current pregnancy? No  Is the patient  on methotrexate? No  Other: bilateral adrenal metastases with high risk for adrenal insufficiency.  History of colorectal cancer diagnosed in 2000 treated with surgery followed by chemotherapy and radiation for her daughter. She had recurrence in 2001 when she was treated with further surgery and intraoperative radiation.

## 2015-05-24 NOTE — Progress Notes (Signed)
Oncology Nurse Navigator Documentation  Oncology Nurse Navigator Flowsheets 05/24/2015  Navigator Encounter Type Initial MedOnc/spoke with patient today during her visit with Dr. Irene Limbo.  She has a daughter that takes care of her and an interpretor today.  Patient does not speak english but her daughter does.  Patient is feeling good today.  Pain is better and better appetite.   All barriers seem to be addressed and I spoke with Dr. Irene Limbo about issues.  I will make a referral for Barb to see patient.  I will also touch base with Lauren CSW.  Family stated Lauren will speak to them today in chemo.    Patient Visit Type Initial;Medonc  Treatment Phase Treatment  Barriers/Navigation Needs Education  Education Other  Time Spent with Patient 30

## 2015-05-24 NOTE — Patient Instructions (Addendum)
No treatment per MD, due to insurance unable to cover medicaiton

## 2015-05-24 NOTE — Progress Notes (Signed)
Dr. Irene Limbo at chairside with pt and daughter, pt unable to receive medication today. Insurance has not approved at this time.  PIV to be removed pt will be d/c home.

## 2015-05-25 ENCOUNTER — Ambulatory Visit
Admission: RE | Admit: 2015-05-25 | Discharge: 2015-05-25 | Disposition: A | Discharge status: Home / Self Care | Payer: Medicaid Other | Source: Ambulatory Visit | Attending: Radiation Oncology | Admitting: Radiation Oncology

## 2015-05-25 ENCOUNTER — Encounter: Payer: Self-pay | Admitting: Radiation Oncology

## 2015-05-25 ENCOUNTER — Other Ambulatory Visit: Payer: Self-pay | Admitting: *Deleted

## 2015-05-25 ENCOUNTER — Other Ambulatory Visit: Payer: Self-pay | Admitting: Hematology

## 2015-05-25 VITALS — BP 136/63 | HR 89 | Temp 97.7°F | Ht 59.0 in | Wt 93.7 lb

## 2015-05-25 DIAGNOSIS — K219 Gastro-esophageal reflux disease without esophagitis: Secondary | ICD-10-CM | POA: Diagnosis not present

## 2015-05-25 DIAGNOSIS — Z933 Colostomy status: Secondary | ICD-10-CM | POA: Diagnosis not present

## 2015-05-25 DIAGNOSIS — R0600 Dyspnea, unspecified: Secondary | ICD-10-CM | POA: Diagnosis not present

## 2015-05-25 DIAGNOSIS — Z510 Encounter for antineoplastic radiation therapy: Secondary | ICD-10-CM | POA: Insufficient documentation

## 2015-05-25 DIAGNOSIS — R7989 Other specified abnormal findings of blood chemistry: Secondary | ICD-10-CM

## 2015-05-25 DIAGNOSIS — Z79899 Other long term (current) drug therapy: Secondary | ICD-10-CM | POA: Diagnosis not present

## 2015-05-25 DIAGNOSIS — C3491 Malignant neoplasm of unspecified part of right bronchus or lung: Secondary | ICD-10-CM | POA: Insufficient documentation

## 2015-05-25 DIAGNOSIS — Z85038 Personal history of other malignant neoplasm of large intestine: Secondary | ICD-10-CM | POA: Diagnosis not present

## 2015-05-25 DIAGNOSIS — Z87891 Personal history of nicotine dependence: Secondary | ICD-10-CM | POA: Diagnosis not present

## 2015-05-25 DIAGNOSIS — M549 Dorsalgia, unspecified: Secondary | ICD-10-CM | POA: Insufficient documentation

## 2015-05-25 DIAGNOSIS — I10 Essential (primary) hypertension: Secondary | ICD-10-CM | POA: Diagnosis not present

## 2015-05-25 NOTE — Progress Notes (Signed)
Complex simulation/treatment planning note: The patient was taken to the CT simulator.  A Vac-Lok immobilization device was constructed.  She was then scanned.  I chose an isocenter between her right pulmonary disease and left lower paraspinal disease.  The CT data set was sent to the planning system for contouring of her normal anatomy.  I contoured her chest CTV and also left lower paraspinal CTV an expanded each CTV by 0.5 cm to create respective PTV's.  These will receive 2000 cGy in 10 sessions.  She will then have a 2 week rest and she may receive further radiation therapy depending on her progress and whether not she moves on to have systemic therapy.

## 2015-05-25 NOTE — Addendum Note (Signed)
Encounter addended by: Benn Moulder, RN on: 05/25/2015 12:36 PM<BR>     Documentation filed: Charges VN

## 2015-05-25 NOTE — Progress Notes (Signed)
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 female who is seen today through the courtesy of Dr. Irene Limbo for consideration of palliative radiation therapy in the management of her stage IV sarcomatoid carcinoma the right lung.  She presented with left mid back pain approximately 2 months ago.  She also reported a 10-15 pound weight loss over 6 months with dysphagia.  A CT scan of the chest, abdomen, and pelvis was performed on 04/19/2015 which showed a large right upper lobe mass abutting the pleural surface in addition to right hilar and mediastinal nodal involvement.  This had the appearance of a primary lung cancer.  She was seen in the oncology clinic on 05/03/2015 by Dr. Irene Limbo and he noted that she had significant back pain with difficulty breathing and fatigue.  She was prescribed liquid morphine and more recently has been placed on OxyContin 10 mg by mouth twice a day with further improvement of her discomfort.  Her staging workup included a PET scan on 05/04/2015 which showed multiple hypermetabolic lesions within the chest, abdomen and pelvis.  The dominant right upper lobe mass had the appearance of lung cancer while multiple lesions seen within the abdomen/pelvis were more suggestive of metastatic colon cancer.  Her right paraspinal chest mass was biopsied and found to be diagnostic for poorly differentiated carcinoma with sarcomatoid features.  A right upper abdominal subcutaneous tissues mass was biopsied on 05/23/2015 for tissue to be sent for EGFR/ALK mutation testing.  Her brain MR on 05/10/2015 was suboptimal because of motion artifact.  There was a 3  mm focus of restricted diffusion along the left frontal subcortical white matter perhaps representing a small acute infarct or mass.  Dr. Irene Limbo planned to treat her with Nivolumab but appears to be a problem with her insurance.  She seen today for consideration of palliative radiotherapy.  PREVIOUS RADIATION THERAPY: She received pelvic radiation therapy (question of 2 weeks preoperatively) at the [facility] in [place] back in 2000/2001 for colorectal cancer.  Records have been requested.   PAST MEDICAL HISTORY:  has a past medical history of Hypertension; Colon cancer; Bowel obstruction; and GERD (gastroesophageal reflux disease) (10/15/2012).     PAST SURGICAL HISTORY:  Past Surgical History  Procedure Laterality Date  . Colon surgery    . Colectomy  s/p colon cancer 2000    . Colostomy       FAMILY HISTORY: family history includes Alcoholism in her brother; Brain cancer in her sister; Heart attack in her mother; Hodgkin's lymphoma in her son; Pneumonia in her brother and father.   SOCIAL HISTORY: History of cigarette smoking.  Widowed for over 30 years, to 3 children living.  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  She is a native of [place] and speak little [place].   ALLERGIES: Review of patient's allergies indicates no known allergies.   MEDICATIONS:  Current Outpatient Prescriptions  Medication Sig Dispense Refill  . Cyanocobalamin (VITAMIN B 12 PO) Take 1 tablet by mouth 2 (two) times daily.    Marland Kitchen  dexamethasone (DECADRON) 2 MG tablet Take 1 tablet (2 mg total) by mouth daily. 30 tablet 0  . Docusate Calcium (STOOL SOFTENER PO) Take 1 tablet by mouth daily.    . magnesium citrate SOLN Take 0.5 Bottles by mouth once.    . morphine (ROXANOL) 20 MG/ML concentrated solution Take 0.25-0.5 mLs (5-10 mg total) by mouth every 2 (two) hours as needed for severe pain or breakthrough pain. 200 mL 0  . omeprazole (PRILOSEC) 40 MG  capsule Take 1 capsule (40 mg total) by mouth daily. (Patient not taking: Reported on 05/08/2015) 30 capsule 3  . ondansetron (ZOFRAN) 8 MG tablet Take 0.5 tablets (4 mg total) by mouth every 6 (six) hours as needed for nausea or vomiting. 30 tablet 3  . ondansetron (ZOFRAN-ODT) 4 MG disintegrating tablet Take 1 tablet (4 mg total) by mouth every 8 (eight) hours as needed for nausea or vomiting. (Patient taking differently: Take 4 mg by mouth daily as needed for nausea or vomiting. ) 20 tablet 0  . Ostomy Supplies (NATURA STOMAHESIVE MOLDABLE) WAFR 1 each by Does not apply route 2 (two) times daily. (Patient not taking: Reported on 05/08/2015) 60 Wafer 12  . OxyCODONE (OXYCONTIN) 10 mg T12A 12 hr tablet Take 1 tablet (10 mg total) by mouth every 12 (twelve) hours. 60 tablet 0  . polyethylene glycol powder (MIRALAX) powder Take 17 g by mouth 2 (two) times daily. (Patient taking differently: Take 17 g by mouth daily. ) 255 g 3  . senna-docusate (SENNA S) 8.6-50 MG per tablet Take 2 tablets by mouth at bedtime. (Patient not taking: Reported on 05/08/2015) 60 tablet 1   No current facility-administered medications for this encounter.     REVIEW OF SYSTEMS:  Pertinent items are noted in HPI.    PHYSICAL EXAM:  height is _0  (1.499 m) and weight is 93 lb 11.2 oz (42.502 kg). Her temperature is 97.7 F (36.5 C). Her blood pressure is 136/63 and her pulse is 89.   Alert and oriented 81 year old Hispanic female appearing her stated age.  Head and neck examination: Grossly unremarkable.  Nodes: There is no palpable cervical or supraclavicular lymphadenopathy.  Chest: Lungs clear.  Back: There is a prominent left thoracic paraspinal mass extending from approximately T5-T12 which is tender to palpation.  Abdomen there is a 3-4 center meter supper taste nodule along the right anterolateral abdomen which was recently biopsied.  There is a lower abdominal colostomy.  Pelvic examination not performed today.   Neurologic examination: Grossly nonfocal.   LABORATORY DATA:  Lab Results  Component Value Date   WBC 17.2* 05/24/2015   HGB 10.1* 05/24/2015   HCT 29.8* 05/24/2015   MCV 74.9* 05/24/2015   PLT 501* 05/24/2015   Lab Results  Component Value Date   NA 134* 05/24/2015   K 5.1 05/24/2015   CL 100 04/17/2015   CO2 26 05/24/2015   Lab Results  Component Value Date   ALT 18 05/24/2015   AST 13 05/24/2015   ALKPHOS 110 05/24/2015   BILITOT 0.35 05/24/2015      IMPRESSION: Metastatic non-small cell carcinoma of the lung with sarcomatoid features with symptomatic involvement of her left mid to lower thoracic spine, and perhaps extrinsic compression of her mid to upper upper esophagus from her right upper lung mass.  On review of her PET scan there does not appear to be obvious extrinsic compression of the esophagus but tumor is adjacent to the right aspect of the esophagus.  On review of her PET scan, one would have to be concerned about the possibility of recurrent colorectal cancer, which she was treated over 15 years ago.  We have requested radiation therapy records from [place] where she had radiation therapy back in 2000/2001.  She will return later today for CT simulation to treat her right upper chest and left parasternal mass.  Based on the size of this field I will treat her over 2 weeks giving her 2000 cGy in 10 sessions.  Further radiation therapy can be given later depending on her response to chemotherapy in her symptomatology.  I discussed the potential acute and late toxicities of radiation therapy and she wishes to proceed as outlined.  Her daughter served as a Optometrist during the entire consultation.   PLAN: As above.  I spent 60  minutes face to face with the patient and more than 50% of that time was spent in counseling and/or coordination of care.

## 2015-05-26 ENCOUNTER — Telehealth: Payer: Self-pay | Admitting: Hematology

## 2015-05-26 ENCOUNTER — Ambulatory Visit (HOSPITAL_BASED_OUTPATIENT_CLINIC_OR_DEPARTMENT_OTHER): Payer: Medicaid Other

## 2015-05-26 ENCOUNTER — Other Ambulatory Visit: Payer: Self-pay | Admitting: *Deleted

## 2015-05-26 VITALS — BP 113/49 | HR 92 | Temp 98.1°F | Resp 20

## 2015-05-26 DIAGNOSIS — Z5112 Encounter for antineoplastic immunotherapy: Secondary | ICD-10-CM

## 2015-05-26 DIAGNOSIS — C7972 Secondary malignant neoplasm of left adrenal gland: Secondary | ICD-10-CM

## 2015-05-26 DIAGNOSIS — C3490 Malignant neoplasm of unspecified part of unspecified bronchus or lung: Secondary | ICD-10-CM

## 2015-05-26 DIAGNOSIS — C7971 Secondary malignant neoplasm of right adrenal gland: Secondary | ICD-10-CM | POA: Diagnosis not present

## 2015-05-26 DIAGNOSIS — C3491 Malignant neoplasm of unspecified part of right bronchus or lung: Secondary | ICD-10-CM

## 2015-05-26 LAB — T4, FREE: FREE T4: 0.92 ng/dL (ref 0.80–1.80)

## 2015-05-26 MED ORDER — DIPHENHYDRAMINE HCL 25 MG PO CAPS
ORAL_CAPSULE | ORAL | Status: AC
  Filled 2015-05-26: qty 1

## 2015-05-26 MED ORDER — SODIUM CHLORIDE 0.9 % IV SOLN
120.0000 mg | Freq: Once | INTRAVENOUS | Status: AC
  Administered 2015-05-26: 120 mg via INTRAVENOUS
  Filled 2015-05-26: qty 12

## 2015-05-26 MED ORDER — DIPHENHYDRAMINE HCL 25 MG PO CAPS
25.0000 mg | ORAL_CAPSULE | Freq: Once | ORAL | Status: AC
  Administered 2015-05-26: 25 mg via ORAL

## 2015-05-26 MED ORDER — FAMOTIDINE IN NACL 20-0.9 MG/50ML-% IV SOLN
INTRAVENOUS | Status: AC
  Filled 2015-05-26: qty 50

## 2015-05-26 MED ORDER — FAMOTIDINE 20 MG PO TABS
20.0000 mg | ORAL_TABLET | Freq: Once | ORAL | Status: AC
  Administered 2015-05-26: 20 mg via ORAL

## 2015-05-26 MED ORDER — SODIUM CHLORIDE 0.9 % IV SOLN
Freq: Once | INTRAVENOUS | Status: AC
  Administered 2015-05-26: 15:00:00 via INTRAVENOUS

## 2015-05-26 NOTE — Patient Instructions (Addendum)
[facility] Discharge Instructions for Patients Receiving Chemotherapy  Today you received the following chemotherapy agents Nivolumab  To help prevent nausea and vomiting after your treatment, we encourage you to take your nausea medication as directed   If you develop nausea and vomiting that is not controlled by your nausea medication, call the clinic.   BELOW ARE SYMPTOMS THAT SHOULD BE REPORTED IMMEDIATELY:  *FEVER GREATER THAN 100.5 F  *CHILLS WITH OR WITHOUT FEVER  NAUSEA AND VOMITING THAT IS NOT CONTROLLED WITH YOUR NAUSEA MEDICATION  *UNUSUAL SHORTNESS OF BREATH  *UNUSUAL BRUISING OR BLEEDING  TENDERNESS IN MOUTH AND THROAT WITH OR WITHOUT PRESENCE OF ULCERS  *URINARY PROBLEMS  *BOWEL PROBLEMS  UNUSUAL RASH Items with * indicate a potential emergency and should be followed up as soon as possible.  Feel free to call the clinic you have any questions or concerns. The clinic phone number is [phone].  Please show the [place] at check-in to the Emergency Department and triage nurse.  Nivolumab injection Qu es este medicamento? NIVOLUMAB se utiliza para tratar a ciertos tipos de melanoma y cncer de pulmon. Este medicamento puede ser utilizado para otros usos; si tiene alguna pregunta consulte con su proveedor de atencin mdica o con su farmacutico. MARCAS COMERCIALES DISPONIBLES: Ahmed Prima le debo informar a mi profesional de la salud antes de tomar este medicamento? Necesita saber si usted presenta alguno de los siguientes problemas o situaciones: -enfermedad ocular, problemas de visin -antecedentes de pancreatitis -problemas del sistema inmunolgico -enfermedad intestinal inflamatoria -enfermedad renal -enfermedad heptica -enfermedad pulmonar o respiratoria -lupus -miastenia gravis -esclerosis mltiple -trasplante de rganos -problemas de estmago o intestinos -enfermedad tiroidea -hormigueo de los dedos o los dedos de  los pies u otro trastorno nervioso -una reaccin alrgica o inusual al nivolumab, a otros medicamentos, alimentos, colorantes o conservantes -si est embarazada o buscando quedar embarazada -si est amamantando a un beb Cmo debo utilizar este medicamento? Este medicamento se administra mediante infusin por va intravenosa. Lo administra un profesional de Technical sales engineer en un hospital o en un entorno clnico. Su farmacutico le dar una [place] del medicamento especial con cada receta y relleno. Asegrese de leer esta informacin cada vez cuidadosamente. Hable con su pediatra para informarse acerca del uso de este medicamento en nios. Puede requerir atencin especial. Sobredosis: Pngase en contacto inmediatamente con un centro toxicolgico o una sala de urgencia si usted cree que haya tomado demasiado medicamento. ATENCIN: [employer] es solo para usted. No comparta este medicamento con nadie. Qu sucede si me olvido de una dosis? Es importante no olvidar ninguna dosis. Informe a su mdico o a su profesional de la salud si no puede asistir a Photographer. Qu puede interactuar con este medicamento? No se han estudiado las interacciones. Puede ser que esta lista no menciona todas las posibles interacciones. Informe a su profesional de [employer]	[place] de [employer] productos a base de hierbas, medicamentos de [place] o suplementos nutritivos que est tomando. Si usted fuma, consume bebidas alcohlicas o si utiliza drogas ilegales, indqueselo tambin a su profesional de [employer]	[place]. Algunas sustancias pueden interactuar con su medicamento. A qu debo estar atento al usar [employer]? Informe a su mdico o su profesional de la salud si sus sntomas no comienzan a mejorar o si empeoran. Se supervisar su estado de salud atentamente mientras reciba este medicamento. Necesitar realizarse [employer] de sangre mientras recibe [place]. Qu efectos secundarios puedo tener al [employer] este  medicamento? Efectos  secundarios que debe informar a su mdico o a Barrister's clerk de la salud tan pronto como sea posible: -reacciones alrgicas como erupcin cutnea, picazn o urticarias, hinchazn de la cara, labios o lengua -heces de color oscuro, de aspecto alquitranado -diarrea acuosa o con sangre -cambios en la visin -escalofros -tos -estado de nimo depresivo -dolor ocular -sentirse ansioso -fiebre -sensacin general de estar enfermo o sntomas gripales -cado del cabello -prdida del apetito -conteos sanguneos bajos - este medicamento puede disminuir la cantidad de glbulos blancos, glbulos rojos y plaquetas. Usted puede estar a mayor riesgo para infecciones o Therapist, art. -dolor, hormigueo, entumecimiento de las manos o pies -enrojecimiento, formacin de ampollas, descamacin o distensin de la piel, inclusive dentro de la boca -puntos rojos en la piel -signos de reduccin de plaquetas o sangrado - magulladuras, puntos rojos en la piel, heces de color oscuro o con aspecto alquitranado, sangre en la orina -signos de reduccin de glbulos rojos - cansancio o debilidad inusual, sensacin de desmayos o aturdimiento, cadas -signos de infeccin - fiebre o escalofros, tos, dolor de garganta, dolor o dificultad para orinar -signos y sntomas de peligrosos cambios del pulso cardiaco o ritmo cardiaco tales come dolor en el pecho; mareos; pulso cardiaco rpido, irregular; palpitaciones; sensacin de desmayos o aturdimiento, cadas; problemas respiratorios -signos y sntomas de alto nivel de azcar en la sangre tales como mareos; boca seca, piel seca; aliento afrutado; nuseas; dolor de Superior; aumento de [place] o de sed; Garment/textile technologist con mayor frecuencia -signos y sntomas de lesin renal tales como dificultad para orinar o cambios en el volumen de orina -signos o sntomas de lesin al hgado tales como orina oscura; sensacin general de estar enfermo o sntomas gripales; heces de color clara;  prdida de apetito; nuseas; dolor en la regin abdominal superior derecha; cansancio o debilidad inusual; color amarillento de los ojos o la piel -signos y sntomas de bajo nivel de potasio, tales como calambres o dolores musculares; dolor en el pecho; Tree surgeon; sensacin de Youth worker o aturdimiento, cadas; palpitaciones; problemas respiratorios o pulso cardiaco rpido, irregular -hinchazn de los tobillos, pies, manos -aumento de [employer], por lo general, no requieren Geophysical data processor (debe informarlos a su mdico o a su profesional de la salud si persisten o si son molestos): -estreimiento -sensacin general de estar enfermo o sntomas gripales -cada de pello -prdida de apetito -nuseas, vomito Puede ser que [employer] no menciona todos los posibles efectos secundarios. Comunquese a su mdico por asesoramiento mdico [employer]. Usted puede informar los efectos secundarios a la FDA por telfono al 1-800-FDA-1088. Dnde debo guardar mi medicina? Este medicamento se administra en hospitales o clnicas y no necesitar guardarlo en su domicilio. ATENCIN: Este folleto es un resumen. Puede ser que no cubra toda la posible informacin. Si usted tiene preguntas acerca de esta medicina, consulte con su mdico, su farmacutico o su profesional de Technical sales engineer.  2015, Elsevier/Gold Standard. (2013-12-16 15:33:05)

## 2015-05-26 NOTE — Addendum Note (Signed)
Encounter addended by: Benn Moulder, RN on: 05/26/2015 10:15 AM<BR>     Documentation filed: Charges VN

## 2015-05-26 NOTE — Telephone Encounter (Signed)
Chemo appointment r/s to 9/16 as 9/15 is capped and lashonya is aware.  Pt will get a new  avs in chemo

## 2015-05-30 ENCOUNTER — Ambulatory Visit
Admission: RE | Admit: 2015-05-30 | Discharge: 2015-05-30 | Disposition: A | Discharge status: Home / Self Care | Payer: Medicaid Other | Source: Ambulatory Visit | Attending: Radiation Oncology | Admitting: Radiation Oncology

## 2015-05-30 ENCOUNTER — Encounter: Payer: Self-pay | Admitting: Radiation Oncology

## 2015-05-30 ENCOUNTER — Telehealth: Payer: Self-pay | Admitting: *Deleted

## 2015-05-30 ENCOUNTER — Ambulatory Visit: Payer: Medicaid Other | Admitting: Radiation Oncology

## 2015-05-30 ENCOUNTER — Other Ambulatory Visit: Payer: Self-pay | Admitting: *Deleted

## 2015-05-30 ENCOUNTER — Ambulatory Visit: Payer: Medicaid Other

## 2015-05-30 VITALS — BP 126/64 | HR 92

## 2015-05-30 DIAGNOSIS — Z510 Encounter for antineoplastic radiation therapy: Secondary | ICD-10-CM | POA: Diagnosis not present

## 2015-05-30 DIAGNOSIS — C3491 Malignant neoplasm of unspecified part of right bronchus or lung: Secondary | ICD-10-CM

## 2015-05-30 NOTE — Addendum Note (Signed)
Encounter addended by: Benn Moulder, RN on: 05/30/2015  4:17 PM<BR>     Documentation filed: Charges VN

## 2015-05-30 NOTE — Progress Notes (Signed)
3-D simulation note: The patient completed 3-D simulation today in the management of her non-small cell carcinoma of the lung.  She was set up with four unique fields, obliquely to minimize dose to the lung and spinal cord.  Four unique MLCs were designed to conform the field.  Dose volume histograms were obtained for the spinal cord, lung, esophagus and heart.  We met our departmental guidelines.  I am prescribing 2000 cGy in 10 sessions utilizing 10 MV photons.

## 2015-05-30 NOTE — Telephone Encounter (Signed)
Call from pt's daughter requesting refill on "steroid".  Last ordered 8/25, per rx pt should not be out.  Due to communication barrier over telephone, spoke to [place], RN in radiation to see if she could help with rx today during radiation appointment.  Candice, RN stated she would let us know if there was something we could help with.

## 2015-05-30 NOTE — Telephone Encounter (Signed)
Called patient to follow up regarding first time nivolumab.  Left VM, instructed patient to call with any questions/concerns.

## 2015-05-30 NOTE — Progress Notes (Signed)
Weekly Management Note:  Site: Chest Current Dose:  200  cGy Projected Dose: 2000  cGy  Narrative: The patient is seen today for routine under treatment assessment. CBCT/MVCT images/port films were reviewed. The chart was reviewed.   She seen today with an interpreter.  Her daughter tells me that she has been confused and is also been having difficulty with constipation, both felt to be secondary to her narcotic use for control of her pain.  She received her first dose of Nivolumab.  Her daughter tells me that she eats very little.  Physical Examination:  Filed Vitals:   05/30/15 1737  BP: 126/64  Pulse: 92  .  Weight:  .  Alert and oriented.  Nodes: There is no palpable cervical or supraclavicular lymphadenopathy.  Abdomen: Soft and slightly tender bilaterally.  Impression: Tolerating radiation therapy well.  Plan: Continue radiation therapy as planned.

## 2015-05-31 ENCOUNTER — Telehealth: Payer: Self-pay | Admitting: Hematology

## 2015-05-31 ENCOUNTER — Ambulatory Visit
Admission: RE | Admit: 2015-05-31 | Discharge: 2015-05-31 | Disposition: A | Discharge status: Home / Self Care | Payer: Medicaid Other | Source: Ambulatory Visit | Attending: Radiation Oncology | Admitting: Radiation Oncology

## 2015-05-31 ENCOUNTER — Encounter: Payer: Self-pay | Admitting: Skilled Nursing Facility1

## 2015-05-31 ENCOUNTER — Ambulatory Visit: Payer: Medicaid Other

## 2015-05-31 ENCOUNTER — Encounter (HOSPITAL_COMMUNITY): Payer: Self-pay

## 2015-05-31 DIAGNOSIS — Z510 Encounter for antineoplastic radiation therapy: Secondary | ICD-10-CM | POA: Diagnosis not present

## 2015-05-31 NOTE — Telephone Encounter (Signed)
lvm fo rpt regarding to sept appt.Marland KitchenMarland KitchenMarland KitchenMarland Kitchenpt ok and aware

## 2015-05-31 NOTE — Progress Notes (Signed)
Subjective:     Patient ID: Ashley Green, female   DOB: 1927-12-12, 81 y.o.   MRN: [id number]  HPI   Review of Systems     Objective:   Physical Exam To assist the pt in identifying dietary strategies to gain lost wt back.    Assessment:     Pt identified as being malnourished due to losing some wt. Pt was contacted via the telephone at [phone]. Pts daughter answered the phone and stated she is the pts power of attorney. Pts daughter states her mother was 110 pounds and is now 97 pounds. Pts daughter states her mother drinks 1-2 ensures a day and her daughter adds peanut butter or almond milk to them sometimes.       Plan:     Dietitian conversed with the pts daughter about how some pts with a lack of appetite better tolerate cold/bland food items so offering more foods in that realm may be better accepted by her mother. Dietitian also suggested she keep the foods her mother most often accepts available in the house and to offer her mother food every hour. Dietitian also advised she keep the eating environment calm and stress free.  Dietitian will send nutrition information to the pts home.

## 2015-06-01 ENCOUNTER — Ambulatory Visit: Payer: Medicaid Other

## 2015-06-01 ENCOUNTER — Ambulatory Visit
Admission: RE | Admit: 2015-06-01 | Discharge: 2015-06-01 | Disposition: A | Discharge status: Home / Self Care | Payer: Medicaid Other | Source: Ambulatory Visit | Attending: Radiation Oncology | Admitting: Radiation Oncology

## 2015-06-01 DIAGNOSIS — Z510 Encounter for antineoplastic radiation therapy: Secondary | ICD-10-CM | POA: Diagnosis not present

## 2015-06-02 ENCOUNTER — Ambulatory Visit
Admission: RE | Admit: 2015-06-02 | Discharge: 2015-06-02 | Disposition: A | Discharge status: Home / Self Care | Payer: Medicaid Other | Source: Ambulatory Visit | Attending: Radiation Oncology | Admitting: Radiation Oncology

## 2015-06-02 ENCOUNTER — Ambulatory Visit: Payer: Medicaid Other

## 2015-06-02 ENCOUNTER — Encounter: Payer: Self-pay | Admitting: Radiation Oncology

## 2015-06-02 ENCOUNTER — Other Ambulatory Visit: Payer: Self-pay | Admitting: Physician Assistant

## 2015-06-02 DIAGNOSIS — Z510 Encounter for antineoplastic radiation therapy: Secondary | ICD-10-CM | POA: Diagnosis not present

## 2015-06-02 NOTE — Progress Notes (Addendum)
Chart note: We obtained medical records from [place], [place] regarding his radiation therapy for Ms. Morua.  She received standard preoperative ration therapy to the pelvis for a T3 N1 adenocarcinoma of the rectum.  Details of her radiation therapy have been scanned into the Epic medical record.  She received a dose of 5040 cGy in 28 sessions.

## 2015-06-05 ENCOUNTER — Ambulatory Visit
Admission: RE | Admit: 2015-06-05 | Discharge: 2015-06-05 | Disposition: A | Discharge status: Home / Self Care | Payer: Medicaid Other | Source: Ambulatory Visit | Attending: Radiation Oncology | Admitting: Radiation Oncology

## 2015-06-05 ENCOUNTER — Ambulatory Visit (HOSPITAL_BASED_OUTPATIENT_CLINIC_OR_DEPARTMENT_OTHER): Payer: Medicaid Other | Admitting: Hematology

## 2015-06-05 ENCOUNTER — Telehealth: Payer: Self-pay | Admitting: Hematology

## 2015-06-05 ENCOUNTER — Ambulatory Visit: Admission: RE | Admit: 2015-06-05 | Payer: Medicaid Other | Source: Ambulatory Visit

## 2015-06-05 ENCOUNTER — Inpatient Hospital Stay: Admission: AD | Admit: 2015-06-05 | Payer: Medicaid Other | Source: Ambulatory Visit | Admitting: Hematology

## 2015-06-05 ENCOUNTER — Other Ambulatory Visit: Payer: Self-pay | Admitting: *Deleted

## 2015-06-05 ENCOUNTER — Ambulatory Visit: Payer: Medicaid Other

## 2015-06-05 VITALS — BP 117/71 | HR 107 | Temp 97.2°F | Resp 18 | Ht 59.0 in | Wt 101.3 lb

## 2015-06-05 DIAGNOSIS — G893 Neoplasm related pain (acute) (chronic): Secondary | ICD-10-CM

## 2015-06-05 DIAGNOSIS — C7972 Secondary malignant neoplasm of left adrenal gland: Secondary | ICD-10-CM | POA: Diagnosis not present

## 2015-06-05 DIAGNOSIS — C3491 Malignant neoplasm of unspecified part of right bronchus or lung: Secondary | ICD-10-CM

## 2015-06-05 DIAGNOSIS — C792 Secondary malignant neoplasm of skin: Secondary | ICD-10-CM

## 2015-06-05 DIAGNOSIS — R410 Disorientation, unspecified: Secondary | ICD-10-CM

## 2015-06-05 DIAGNOSIS — R4182 Altered mental status, unspecified: Secondary | ICD-10-CM

## 2015-06-05 DIAGNOSIS — D473 Essential (hemorrhagic) thrombocythemia: Secondary | ICD-10-CM

## 2015-06-05 DIAGNOSIS — D630 Anemia in neoplastic disease: Secondary | ICD-10-CM | POA: Diagnosis not present

## 2015-06-05 DIAGNOSIS — C7989 Secondary malignant neoplasm of other specified sites: Secondary | ICD-10-CM | POA: Diagnosis not present

## 2015-06-05 DIAGNOSIS — C7971 Secondary malignant neoplasm of right adrenal gland: Secondary | ICD-10-CM | POA: Diagnosis not present

## 2015-06-05 DIAGNOSIS — C3490 Malignant neoplasm of unspecified part of unspecified bronchus or lung: Secondary | ICD-10-CM | POA: Diagnosis present

## 2015-06-05 LAB — GUARDANT 360

## 2015-06-05 MED ORDER — QUETIAPINE FUMARATE 25 MG PO TABS: 12.5000 mg | ORAL_TABLET | Freq: Every day | ORAL | Status: DC

## 2015-06-05 NOTE — Progress Notes (Signed)
Ashley Green    HEMATOLOGY/ONCOLOGY CLINIC NOTE  Date of Service: 06/05/2015  Patient Care Team: Lorna Few, DO as PCP - General  CHIEF COMPLAINTS/PURPOSE OF CONSULTATION:  Follow up for management of metastatic sarcomatoid lung cancer  Interval History  [name] is here for her scheduled follow-up for metastatic sarcomatoid lung cancer. She has been getting her radiation therapies and tolerating them okay thus far. Daughter notes that she has been eating less and appears more confused and has had difficulty sleeping at night. She complains that no one gives her food to eat which is obviously incorrect. Discussed with patient's daughter about possibly direct admitting her to [facility] for workup of her confusion. No beds available in the hospital and the daughter and the patient chose not to go to the emergency room and with there for an expected wait time of 4 hours plus. She was scheduled for a follow-up appointment in the next day with labs to try to evaluate the cause of her delirium further. Her confusion did not allow her to comply with her RT therapy today. Daughter is okay with watching over her mother today. Pain has been well-controlled. Patient has been having bowel movements as per ostomy output. No headaches or change in vision. No fevers or chills.  MEDICAL HISTORY:  Past Medical History  Diagnosis Date  . Hypertension   . Colon cancer   . Bowel obstruction   . GERD (gastroesophageal reflux disease) 10/15/2012    SURGICAL HISTORY: Past Surgical History  Procedure Laterality Date  . Colon surgery    . Colectomy  s/p colon cancer 2000    . Colostomy      SOCIAL HISTORY: Social History   Social History  . Marital Status: Single    Spouse Name: N/A  . Number of Children: 3  . Years of Education: N/A   Occupational History  . retired    Social History Main Topics  . Smoking status: Never Smoker   . Smokeless tobacco: Never Used  . Alcohol Use: No  . Drug  Use: No  . Sexual Activity: No   Other Topics Concern  . Not on file   Social History Narrative   Works at [employer].     FAMILY HISTORY: Family History  Problem Relation Age of Onset  . Hodgkin's lymphoma Son   . Heart attack Mother   . Brain cancer Sister   . Pneumonia Father   . Alcoholism Brother   . Pneumonia Brother     ALLERGIES:  has No Known Allergies.  MEDICATIONS:  Current Outpatient Prescriptions  Medication Sig Dispense Refill  . Cyanocobalamin (VITAMIN B 12 PO) Take 1 tablet by mouth 2 (two) times daily.    Ashley Green dexamethasone (DECADRON) 2 MG tablet Take 1 tablet (2 mg total) by mouth daily. 30 tablet 0  . Docusate Calcium (STOOL SOFTENER PO) Take 1 tablet by mouth daily.    . magnesium citrate SOLN Take 0.5 Bottles by mouth once.    . morphine (ROXANOL) 20 MG/ML concentrated solution Take 0.25-0.5 mLs (5-10 mg total) by mouth every 2 (two) hours as needed for severe pain or breakthrough pain. 200 mL 0  . omeprazole (PRILOSEC) 40 MG capsule Take 1 capsule (40 mg total) by mouth daily. 30 capsule 3  . ondansetron (ZOFRAN) 8 MG tablet Take 0.5 tablets (4 mg total) by mouth every 6 (six) hours as needed for nausea or vomiting. 30 tablet 3  . ondansetron (ZOFRAN-ODT) 4 MG disintegrating tablet  Take 1 tablet (4 mg total) by mouth every 8 (eight) hours as needed for nausea or vomiting. (Patient taking differently: Take 4 mg by mouth daily as needed for nausea or vomiting. ) 20 tablet 0  . Ostomy Supplies (NATURA STOMAHESIVE MOLDABLE) WAFR 1 each by Does not apply route 2 (two) times daily. 60 Wafer 12  . OxyCODONE (OXYCONTIN) 10 mg T12A 12 hr tablet Take 1 tablet (10 mg total) by mouth every 12 (twelve) hours. 60 tablet 0  . polyethylene glycol powder (MIRALAX) powder Take 17 g by mouth 2 (two) times daily. (Patient taking differently: Take 17 g by mouth daily. ) 255 g 3  . QUEtiapine (SEROQUEL) 25 MG tablet Take 0.5-1 tablets (12.5-25 mg total) by mouth at  bedtime. 30 tablet 0  . senna-docusate (SENNA S) 8.6-50 MG per tablet Take 2 tablets by mouth at bedtime. 60 tablet 1   No current facility-administered medications for this visit.    REVIEW OF SYSTEMS:    10 Point review of Systems was done is negative except as noted above.  PHYSICAL EXAMINATION: ECOG PERFORMANCE STATUS: 1 - Symptomatic but completely ambulatory  . Filed Vitals:   06/05/15 1500  Height: _0  (1.499 m)  Weight: 101 lb 4.8 oz (45.949 kg)   Filed Weights   06/05/15 1500  Weight: 101 lb 4.8 oz (45.949 kg)   .Body mass index is 20.45 kg/(m^2).  GENERAL:alert, in no acute distress and comfortable SKIN: skin color, texture, turgor are normal, no rashes or significant lesions EYES: normal, conjunctiva are pink and non-injected, sclera clear OROPHARYNX:no exudate, no erythema and lips, buccal mucosa, and tongue normal  NECK: supple, no JVD, thyroid normal size, non-tender, without nodularity LYMPH:  no palpable lymphadenopathy in the cervical, axillary or inguinal LUNGS: clear to auscultation with normal respiratory effort HEART: regular rate & rhythm,  no murmurs and no lower extremity edema ABDOMEN: abdomen soft, non-tender, normoactive bowel sounds  Musculoskeletal: no cyanosis of digits and no clubbing  PSYCH: alert & oriented x 3 with fluent speech NEURO: no focal motor/sensory deficits  LABORATORY DATA:  I have reviewed the data as listed  . CBC Latest Ref Rng 05/24/2015 05/23/2015 05/18/2015  WBC 3.9 - 10.3 10e3/uL 17.2(H) 15.1(H) 10.2  Hemoglobin 11.6 - 15.9 g/dL 10.1(L) 9.9(L) 8.1(L)  Hematocrit 34.8 - 46.6 % 29.8(L) 28.9(L) 23.7(L)  Platelets 145 - 400 10e3/uL 501(H) 549(H) 613(H)    . CMP Latest Ref Rng 05/24/2015 05/18/2015 05/04/2015  Glucose 70 - 140 mg/dl 163(H) 173(H) 175(H)  BUN 7.0 - 26.0 mg/dL 16.3 9.7 13.4  Creatinine 0.6 - 1.1 mg/dL 0.7 0.7 0.7  Sodium 136 - 145 mEq/L 134(L) 130(L) 131(L)  Potassium 3.5 - 5.1 mEq/L 5.1 4.6 3.9    Chloride 98 - 110 mmol/L - - -  CO2 22 - 29 mEq/L _1 Calcium 8.4 - 10.4 mg/dL 9.6 9.6 9.7  Total Protein 6.4 - 8.3 g/dL 6.8 6.8 7.5  Total Bilirubin 0.20 - 1.20 mg/dL 0.35 0.43 0.59  Alkaline Phos 40 - 150 U/L 110 96 96  AST 5 - 34 U/L _2 ALT 0 - 55 U/L _3 RADIOGRAPHIC STUDIES: I have personally reviewed the radiological images as listed and agreed with the findings in the report. Mr Jeri Cos Wo Contrast  05/10/2015   CLINICAL DATA:  Patient fell 1 month ago. Unsure if she struck head. History of colon cancer. Staging.  EXAM: MRI HEAD WITHOUT  CONTRAST  TECHNIQUE: Multiplanar, multiecho pulse sequences of the brain and surrounding structures were obtained without intravenous contrast.  COMPARISON:  MR head 03/22/2011.  FINDINGS: Patient was unable to tolerate standard MRI brain imaging with and without contrast. No contrast could be administered. Only a few sequences were obtained. Some of these are motion degraded.  Sagittal T1 weighted images demonstrate no midline abnormality. There is mild cervical spondylosis without osseous destruction.  On diffusion-weighted imaging, there is a small 3 mm focus of restricted diffusion, in the LEFT frontal subcortical white matter as seen on image 35 series 4, which could represent ischemia or small metastasis. No definite surrounding vasogenic edema on T2 imaging. No other definite areas of restricted diffusion are observed.  Generalized atrophy. Moderately advanced T2 and FLAIR hyperintensities representing small vessel disease. T2 and FLAIR axial images are insufficiently detailed, with too much motion, to allow detailed correlation with the LEFT frontal diffusion abnormality.  IMPRESSION: Suboptimal exam secondary to patient motion, and inability to tolerate the routine sequences required for pre and postcontrast imaging. No post infusion imaging was obtained.  3 mm focus of restricted diffusion LEFT frontal subcortical white matter  could represent a small acute infarct or mass. See discussion above.  Generalized atrophy with small vessel disease.  Consider repeat imaging with sedation or pain medicine to allow complete assessment of the brain with and without contrast.  These results will be called to the ordering clinician or representative by the Radiologist Assistant, and communication documented in the PACS or zVision Dashboard.   Electronically Signed   By: Staci Righter M.D.   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  Request made for repeat biopsy of an additional hypermetabolic subcutaneous nodule within the subcutaneous tissues of the right upper abdominal quadrant for EGFR mutation testing, alk gene rearrangement and Ros 1 testing.  EXAM: ULTRASOUND GUIDED BIOPSY OF HYPERMETABOLIC NODULE WITHIN THE SUBCUTANEOUS TISSUES WITHIN THE RIGHT UPPER ABDOMEN  COMPARISON:  PET-CT - 05/04/2015; ultrasound-guided biopsy of dominant hypermetabolic mass with the subcutaneous tissues of the back - 05/08/2015  MEDICATIONS: None  ANESTHESIA/SEDATION: Conscious sedation was achieved with intravenous Versed and Fentanyl.  Total Moderate Sedation time  15 minutes  COMPLICATIONS: None immediate  PROCEDURE: Informed written consent was obtained from the patient and the patient's daughter via the use of a medical translator after a discussion of the risks, benefits and alternatives to treatment. The patient understands and consents the procedure. A timeout was performed prior to the initiation of the procedure.  Ultrasound scanning was performed of the right upper abdominal quadrant demonstrates a well-defined punctate approximately 1.3 x 1.2 cm hypoechoic nodule within the subcutaneous tissues of the right anterior  lateral abdomen compatible with the hypermetabolic nodule seen on preceding PET-CT.  The right upper abdominal quadrant was prepped and draped in the usual sterile fashion. The overlying soft tissues were anesthetized with 1% lidocaine with epinephrine. This was followed by 6 core biopsies with an 18 gauge core device under direct ultrasound guidance. Multiple ultrasound images were saved for procedural documentation purposes.  Hemostasis was achieved with manual compression. Post procedural scanning was negative for definitive area of hemorrhage or additional complication. A dressing was placed. The patient tolerated the procedure well without immediate post procedural complication.  IMPRESSION: Technically successful ultrasound guided core needle biopsy  of hypermetabolic nodule within the subcutaneous tissues of the right anterior lateral abdomen.   Electronically Signed   By: Sandi Mariscal M.D.   On: 05/23/2015 15:04   US Biopsy  05/08/2015   CLINICAL DATA:  81 year old female with a history of colon cancer and now with multifocal metastatic disease of uncertain etiology. There is a larger lesion in the right upper lobe of the lung as well as a lesion in the pancreatic head. Differential considerations include primary lung, pancreas and colon cancer. Ultrasound-guided core biopsy is warranted to facilitate tissue diagnosis.  EXAM: ULTRASOUND BIOPSY CORE LIVER  Date: 05/08/2015  PROCEDURE: 1. Ultrasound-guided core biopsy of paraspinal soft tissue mass. Interventional Radiologist:  Criselda Peaches, MD  ANESTHESIA/SEDATION: Moderate (conscious) sedation was used. 0.5 mg Versed, 25 mcg Fentanyl were administered intravenously. The patient's vital signs were monitored continuously by radiology nursing throughout the procedure.  Sedation Time: 8 minutes  MEDICATIONS: None additional  TECHNIQUE: Informed consent was obtained from the patient following explanation of the procedure, risks, benefits and alternatives.  The patient understands, agrees and consents for the procedure. All questions were addressed. A time out was performed.  The left paraspinal soft tissues were interrogated with ultrasound. An approximately 4.2 by 2.0 complex hypoechoic soft tissue mass can be identified. A suitable skin entry site was selected and marked. The region was then sterilely prepped and draped in the standard fashion using Betadine skin prep. Local anesthesia was attained by infiltration with 1% lidocaine. A small dermatotomy was made. Under real-time sonographic guidance, multiple 18 gauge core biopsies were obtained using the bio Pince automated biopsy device. Biopsy specimens were placed in saline and delivered to the lab for further evaluation.  Post biopsy imaging demonstrates no evidence of complication. The biopsy tracts remain visible within the mass. The patient tolerated the procedure well.  COMPLICATIONS: None  Estimated blood loss: 0  IMPRESSION: Technically successful ultrasound-guided core biopsy of left paraspinal mass.  Signed,  Criselda Peaches, MD  [facility]  [facility]   Electronically Signed   By: Jacqulynn Cadet M.D.   On: 05/08/2015 11:29    ASSESSMENT & PLAN:    Ashley Green is a very pleasant 81 year old Hispanic female in good overall health with ECOG performance status of 1 and previous history of colon cancer in 2000 and 2001 now with  #1 Stage IV Metastatic poorly differentiated lung carcinoma with sarcomatoid features with mediastinal adenopathy, metastases to bilateral adrenal glands, paraspinal muscles and pancreas as well as abdominal lymph nodes and skin.  CEA level within normal limits. CA-19-9 level somewhat elevated likely due to metastases to the pancreas and less consistent with primary pancreatic metastatic cancer. LDH level within normal limits. Good ECOG performance status is 1 #2 Painful metastases to the spinal muscles over upper  back. #3 Shortness of breath due to her lung mass causing some airway compression. #4 Symptomatic anemia due to her metastatic cancer.  Plan -Patient got her first dose of IV Nivolumab without any immediate toxicities. -Is getting palliative radiation to her lung mass and paraspinal masses.  #5 Pain due to her cancer reasonably controlled as per her daughter Plan -Continue OxyContin and when necessary sublingual morphine. Given her confusion will consider switching sublingual morphine to Dilaudid. -Senna S for bowel prophylaxis -nor notes adequate bowel movements. -Zofran when necessary for nausea. -Radiation therapy for pain control.  #6 Microcytic anemia due to anemia of chronic disease ferritin level 171. Patient transfused yesterday with  some improvement in symptoms .  #7 Bilateral adrenal metastases with high risk for adrenal insufficiency. No overt hypotension today and no issues with hypoglycemia at this time. Plan Dexamethasone discontinued due to confusion and leg swelling.  #8 dysphagia -no intraluminal masses on esophagogram . This symptom is likely from extrinsic compression from right upper lobe lung mass. Ongoing palliative radiation to address this .  #9 history of colorectal cancer diagnosed in 2000 treated with surgery followed by chemotherapy and radiation for her daughter. She had recurrence in 2001 when she was treated with further surgery and intraoperative radiation. Does not report having had any colonoscopy since then. CEA level currently within normal limits. Notes some constipation. No overt blood in the stools or melena.   #10 Neutrophilia and thrombocytosis this is likely paraneoplastic related to her metastatic malignancy.Some increase likely due to steroids  No fevers or chills. Low threshold for treating with antibiotics if she develops fevers due to high risk of postobstructive pneumonia.  #11 Confusion/Mild Delirium -Given option to go to the  emergency room and be admitted to [facility] for further evaluation or workup. We discussed with admitting and there is at least a 4 hour weight today to the hospital being full. Daughter chose to take the patient home and monitor her closely and bring her back for clinic visit the following day with labs to check her CBC, CMP, UA to workup for delirium.  All of the patients/daughters questions were answered to her apparent satisfaction. The patient knows to call the clinic with any problems, questions or concerns.  I spent 25 minutes counseling the patient face to face. The total time spent in the appointment was 35 minutes and more than 50% was on counseling and direct patient cares.    Sullivan Lone MD [place] AAHIVMS [facility] Hematology/Oncology Physician [facility]  (Office):       [phone] (Work cell):  [phone] (Fax):           [phone]  06/05/2015 11:00 PM

## 2015-06-05 NOTE — Telephone Encounter (Signed)
Left message for dtr [name] re lab/GK 9/13 @ 2 pm.

## 2015-06-05 NOTE — Progress Notes (Signed)
Weekly Management Note:  Site: Chest Current Dose:  800  cGy Projected Dose: 2000  cGy  Narrative: The patient is seen today for routine under treatment assessment. CBCT/MVCT images/port films were reviewed. The chart was reviewed.   She is seen today with her daughter.  She refuses to have treatment thinking that "she is being burned".  The patient points to her lower extremities.  Her daughter feels that she is quite confused.  Since her pain is not as severe she has not been getting morphine for breakthrough pain.  However, she does take OxyContin 10 mg by mouth twice a day.  She is seeing Dr. Irene Limbo tomorrow.   Physical Examination: There were no vitals filed for this visit..  Weight:  .  She is not examined today.  Impression: Her pain may be somewhat improved, but she declines treatment today.  She may be confused from her narcotics.  Plan: We will try to resume her radiation therapy tomorrow.

## 2015-06-06 ENCOUNTER — Inpatient Hospital Stay (HOSPITAL_COMMUNITY): Payer: Medicaid Other

## 2015-06-06 ENCOUNTER — Ambulatory Visit
Admission: RE | Admit: 2015-06-06 | Discharge: 2015-06-06 | Disposition: A | Discharge status: Home / Self Care | Payer: Medicaid Other | Source: Ambulatory Visit | Attending: Radiation Oncology | Admitting: Radiation Oncology

## 2015-06-06 ENCOUNTER — Encounter: Payer: Self-pay | Admitting: Hematology

## 2015-06-06 ENCOUNTER — Inpatient Hospital Stay (HOSPITAL_COMMUNITY)
Admission: AD | Admit: 2015-06-06 | Discharge: 2015-06-09 | DRG: 640 | Disposition: A | Discharge status: Skilled Nursing Facility | Payer: Medicaid Other | Source: Ambulatory Visit | Attending: Internal Medicine | Admitting: Internal Medicine

## 2015-06-06 ENCOUNTER — Telehealth: Payer: Self-pay | Admitting: *Deleted

## 2015-06-06 ENCOUNTER — Telehealth: Payer: Self-pay | Admitting: Hematology

## 2015-06-06 ENCOUNTER — Ambulatory Visit (HOSPITAL_BASED_OUTPATIENT_CLINIC_OR_DEPARTMENT_OTHER): Payer: Medicaid Other

## 2015-06-06 ENCOUNTER — Other Ambulatory Visit: Payer: Self-pay | Admitting: *Deleted

## 2015-06-06 ENCOUNTER — Encounter (HOSPITAL_COMMUNITY): Payer: Self-pay | Admitting: Internal Medicine

## 2015-06-06 ENCOUNTER — Ambulatory Visit: Payer: Medicaid Other

## 2015-06-06 ENCOUNTER — Ambulatory Visit (HOSPITAL_BASED_OUTPATIENT_CLINIC_OR_DEPARTMENT_OTHER): Payer: Medicaid Other | Admitting: Hematology

## 2015-06-06 VITALS — BP 134/48 | HR 107 | Temp 98.8°F | Resp 16 | Ht 59.0 in | Wt 101.0 lb

## 2015-06-06 DIAGNOSIS — C7972 Secondary malignant neoplasm of left adrenal gland: Secondary | ICD-10-CM | POA: Diagnosis present

## 2015-06-06 DIAGNOSIS — G893 Neoplasm related pain (acute) (chronic): Secondary | ICD-10-CM | POA: Diagnosis present

## 2015-06-06 DIAGNOSIS — C3491 Malignant neoplasm of unspecified part of right bronchus or lung: Secondary | ICD-10-CM

## 2015-06-06 DIAGNOSIS — Z9049 Acquired absence of other specified parts of digestive tract: Secondary | ICD-10-CM | POA: Diagnosis present

## 2015-06-06 DIAGNOSIS — R1310 Dysphagia, unspecified: Secondary | ICD-10-CM | POA: Diagnosis present

## 2015-06-06 DIAGNOSIS — R1084 Generalized abdominal pain: Secondary | ICD-10-CM

## 2015-06-06 DIAGNOSIS — J9601 Acute respiratory failure with hypoxia: Secondary | ICD-10-CM | POA: Diagnosis not present

## 2015-06-06 DIAGNOSIS — C3490 Malignant neoplasm of unspecified part of unspecified bronchus or lung: Secondary | ICD-10-CM

## 2015-06-06 DIAGNOSIS — D630 Anemia in neoplastic disease: Secondary | ICD-10-CM

## 2015-06-06 DIAGNOSIS — E860 Dehydration: Secondary | ICD-10-CM | POA: Diagnosis present

## 2015-06-06 DIAGNOSIS — K219 Gastro-esophageal reflux disease without esophagitis: Secondary | ICD-10-CM | POA: Diagnosis present

## 2015-06-06 DIAGNOSIS — M7989 Other specified soft tissue disorders: Secondary | ICD-10-CM

## 2015-06-06 DIAGNOSIS — M549 Dorsalgia, unspecified: Secondary | ICD-10-CM | POA: Diagnosis present

## 2015-06-06 DIAGNOSIS — Z66 Do not resuscitate: Secondary | ICD-10-CM | POA: Diagnosis present

## 2015-06-06 DIAGNOSIS — R410 Disorientation, unspecified: Secondary | ICD-10-CM

## 2015-06-06 DIAGNOSIS — C7889 Secondary malignant neoplasm of other digestive organs: Secondary | ICD-10-CM | POA: Diagnosis present

## 2015-06-06 DIAGNOSIS — R7989 Other specified abnormal findings of blood chemistry: Secondary | ICD-10-CM

## 2015-06-06 DIAGNOSIS — C7989 Secondary malignant neoplasm of other specified sites: Secondary | ICD-10-CM

## 2015-06-06 DIAGNOSIS — E871 Hypo-osmolality and hyponatremia: Principal | ICD-10-CM | POA: Diagnosis present

## 2015-06-06 DIAGNOSIS — R0600 Dyspnea, unspecified: Secondary | ICD-10-CM | POA: Diagnosis not present

## 2015-06-06 DIAGNOSIS — F05 Delirium due to known physiological condition: Secondary | ICD-10-CM | POA: Diagnosis not present

## 2015-06-06 DIAGNOSIS — C7971 Secondary malignant neoplasm of right adrenal gland: Secondary | ICD-10-CM | POA: Diagnosis present

## 2015-06-06 DIAGNOSIS — C792 Secondary malignant neoplasm of skin: Secondary | ICD-10-CM | POA: Diagnosis present

## 2015-06-06 DIAGNOSIS — Z79899 Other long term (current) drug therapy: Secondary | ICD-10-CM

## 2015-06-06 DIAGNOSIS — R609 Edema, unspecified: Secondary | ICD-10-CM

## 2015-06-06 DIAGNOSIS — J9620 Acute and chronic respiratory failure, unspecified whether with hypoxia or hypercapnia: Secondary | ICD-10-CM | POA: Diagnosis not present

## 2015-06-06 DIAGNOSIS — E222 Syndrome of inappropriate secretion of antidiuretic hormone: Secondary | ICD-10-CM | POA: Diagnosis present

## 2015-06-06 DIAGNOSIS — Z933 Colostomy status: Secondary | ICD-10-CM

## 2015-06-06 DIAGNOSIS — F329 Major depressive disorder, single episode, unspecified: Secondary | ICD-10-CM | POA: Diagnosis present

## 2015-06-06 DIAGNOSIS — E861 Hypovolemia: Secondary | ICD-10-CM | POA: Diagnosis present

## 2015-06-06 DIAGNOSIS — R770 Abnormality of albumin: Secondary | ICD-10-CM | POA: Diagnosis not present

## 2015-06-06 DIAGNOSIS — D638 Anemia in other chronic diseases classified elsewhere: Secondary | ICD-10-CM | POA: Diagnosis not present

## 2015-06-06 DIAGNOSIS — Z510 Encounter for antineoplastic radiation therapy: Secondary | ICD-10-CM | POA: Diagnosis present

## 2015-06-06 DIAGNOSIS — I10 Essential (primary) hypertension: Secondary | ICD-10-CM | POA: Diagnosis not present

## 2015-06-06 DIAGNOSIS — C775 Secondary and unspecified malignant neoplasm of intrapelvic lymph nodes: Secondary | ICD-10-CM | POA: Diagnosis present

## 2015-06-06 DIAGNOSIS — Z85038 Personal history of other malignant neoplasm of large intestine: Secondary | ICD-10-CM | POA: Diagnosis not present

## 2015-06-06 DIAGNOSIS — E8809 Other disorders of plasma-protein metabolism, not elsewhere classified: Secondary | ICD-10-CM | POA: Diagnosis present

## 2015-06-06 DIAGNOSIS — G9340 Encephalopathy, unspecified: Secondary | ICD-10-CM | POA: Diagnosis present

## 2015-06-06 DIAGNOSIS — C3411 Malignant neoplasm of upper lobe, right bronchus or lung: Secondary | ICD-10-CM | POA: Diagnosis present

## 2015-06-06 DIAGNOSIS — R600 Localized edema: Secondary | ICD-10-CM | POA: Diagnosis present

## 2015-06-06 DIAGNOSIS — C772 Secondary and unspecified malignant neoplasm of intra-abdominal lymph nodes: Secondary | ICD-10-CM

## 2015-06-06 DIAGNOSIS — Z87891 Personal history of nicotine dependence: Secondary | ICD-10-CM | POA: Diagnosis not present

## 2015-06-06 DIAGNOSIS — J90 Pleural effusion, not elsewhere classified: Secondary | ICD-10-CM | POA: Diagnosis not present

## 2015-06-06 DIAGNOSIS — R109 Unspecified abdominal pain: Secondary | ICD-10-CM

## 2015-06-06 DIAGNOSIS — Z7189 Other specified counseling: Secondary | ICD-10-CM | POA: Diagnosis not present

## 2015-06-06 HISTORY — DX: Malignant neoplasm of unspecified part of unspecified bronchus or lung: C34.90

## 2015-06-06 LAB — URINALYSIS, MICROSCOPIC - CHCC
BILIRUBIN (URINE): NEGATIVE
Blood: NEGATIVE
Glucose: NEGATIVE mg/dL
KETONES: NEGATIVE mg/dL
Nitrite: NEGATIVE
Protein: NEGATIVE mg/dL
SPECIFIC GRAVITY, URINE: 1.01 (ref 1.003–1.035)
Urobilinogen, UR: 0.2 mg/dL (ref 0.2–1)
pH: 5 (ref 4.6–8.0)

## 2015-06-06 LAB — COMPREHENSIVE METABOLIC PANEL (CC13)
ALT: 28 U/L (ref 0–55)
AST: 33 U/L (ref 5–34)
Albumin: 2.1 g/dL — ABNORMAL LOW (ref 3.5–5.0)
Alkaline Phosphatase: 104 U/L (ref 40–150)
Anion Gap: 9 mEq/L (ref 3–11)
BUN: 13.6 mg/dL (ref 7.0–26.0)
CALCIUM: 8.7 mg/dL (ref 8.4–10.4)
CHLORIDE: 91 meq/L — AB (ref 98–109)
CO2: 23 meq/L (ref 22–29)
CREATININE: 0.6 mg/dL (ref 0.6–1.1)
EGFR: 83 mL/min/{1.73_m2} — ABNORMAL LOW (ref >90)
GLUCOSE: 125 mg/dL (ref 70–140)
POTASSIUM: 4.7 meq/L (ref 3.5–5.1)
SODIUM: 123 meq/L — AB (ref 136–145)
Total Bilirubin: 0.44 mg/dL (ref 0.20–1.20)
Total Protein: 6.1 g/dL — ABNORMAL LOW (ref 6.4–8.3)

## 2015-06-06 LAB — URINALYSIS, ROUTINE W REFLEX MICROSCOPIC
Bilirubin Urine: NEGATIVE
GLUCOSE, UA: NEGATIVE mg/dL
Hgb urine dipstick: NEGATIVE
KETONES UR: NEGATIVE mg/dL
LEUKOCYTES UA: NEGATIVE
Nitrite: NEGATIVE
PH: 6 (ref 5.0–8.0)
Protein, ur: NEGATIVE mg/dL
Specific Gravity, Urine: 1.013 (ref 1.005–1.030)
Urobilinogen, UA: 1 mg/dL (ref 0.0–1.0)

## 2015-06-06 LAB — CBC WITH DIFFERENTIAL/PLATELET
BASO%: 0.3 % (ref 0.0–2.0)
Basophils Absolute: 0 10*3/uL (ref 0.0–0.1)
EOS%: 0.4 % (ref 0.0–7.0)
Eosinophils Absolute: 0 10*3/uL (ref 0.0–0.5)
HEMATOCRIT: 26.2 % — AB (ref 34.8–46.6)
HGB: 8.7 g/dL — ABNORMAL LOW (ref 11.6–15.9)
LYMPH#: 0.2 10*3/uL — AB (ref 0.9–3.3)
LYMPH%: 1.6 % — ABNORMAL LOW (ref 14.0–49.7)
MCH: 24.1 pg — ABNORMAL LOW (ref 25.1–34.0)
MCHC: 33.4 g/dL (ref 31.5–36.0)
MCV: 72.2 fL — ABNORMAL LOW (ref 79.5–101.0)
MONO#: 1.1 10*3/uL — ABNORMAL HIGH (ref 0.1–0.9)
MONO%: 9.9 % (ref 0.0–14.0)
NEUT#: 9.9 10*3/uL — ABNORMAL HIGH (ref 1.5–6.5)
NEUT%: 87.8 % — AB (ref 38.4–76.8)
Platelets: 422 10*3/uL — ABNORMAL HIGH (ref 145–400)
RBC: 3.62 10*6/uL — AB (ref 3.70–5.45)
RDW: 20.8 % — ABNORMAL HIGH (ref 11.2–14.5)
WBC: 11.3 10*3/uL — ABNORMAL HIGH (ref 3.9–10.3)

## 2015-06-06 LAB — SODIUM, URINE, RANDOM: Sodium, Ur: 12 mmol/L

## 2015-06-06 LAB — TSH CHCC: TSH: 6.868 m[IU]/L — AB (ref 0.308–3.960)

## 2015-06-06 LAB — BASIC METABOLIC PANEL
Anion gap: 11 (ref 5–15)
BUN: 14 mg/dL (ref 6–20)
CALCIUM: 8 mg/dL — AB (ref 8.9–10.3)
CHLORIDE: 89 mmol/L — AB (ref 101–111)
CO2: 23 mmol/L (ref 22–32)
CREATININE: 0.51 mg/dL (ref 0.44–1.00)
Glucose, Bld: 133 mg/dL — ABNORMAL HIGH (ref 65–99)
Potassium: 4.4 mmol/L (ref 3.5–5.1)
SODIUM: 123 mmol/L — AB (ref 135–145)

## 2015-06-06 LAB — CORTISOL: Cortisol, Plasma: 21.7 ug/dL

## 2015-06-06 LAB — CBC
HCT: 24.7 % — ABNORMAL LOW (ref 36.0–46.0)
Hemoglobin: 8.5 g/dL — ABNORMAL LOW (ref 12.0–15.0)
MCH: 24.4 pg — ABNORMAL LOW (ref 26.0–34.0)
MCHC: 34.4 g/dL (ref 30.0–36.0)
MCV: 71 fL — ABNORMAL LOW (ref 78.0–100.0)
PLATELETS: 334 10*3/uL (ref 150–400)
RBC: 3.48 MIL/uL — AB (ref 3.87–5.11)
RDW: 18.7 % — ABNORMAL HIGH (ref 11.5–15.5)
WBC: 12 10*3/uL — AB (ref 4.0–10.5)

## 2015-06-06 LAB — MAGNESIUM (CC13): MAGNESIUM: 1.7 mg/dL (ref 1.5–2.5)

## 2015-06-06 LAB — OSMOLALITY: OSMOLALITY: 260 mosm/kg — AB (ref 275–300)

## 2015-06-06 LAB — T4, FREE: Free T4: 0.94 ng/dL (ref 0.61–1.12)

## 2015-06-06 LAB — BRAIN NATRIURETIC PEPTIDE: B NATRIURETIC PEPTIDE 5: 81.6 pg/mL (ref 0.0–100.0)

## 2015-06-06 MED ORDER — ONDANSETRON HCL 4 MG PO TABS: 4.0000 mg | ORAL_TABLET | Freq: Four times a day (QID) | ORAL | Status: DC | PRN

## 2015-06-06 MED ORDER — SODIUM CHLORIDE 0.9 % IV SOLN
INTRAVENOUS | Status: DC
  Administered 2015-06-06 (×2): via INTRAVENOUS

## 2015-06-06 MED ORDER — POLYETHYLENE GLYCOL 3350 17 GM/SCOOP PO POWD
17.0000 g | Freq: Every day | ORAL | Status: DC
  Filled 2015-06-06: qty 255

## 2015-06-06 MED ORDER — POLYETHYLENE GLYCOL 3350 17 GM/SCOOP PO POWD: 17.0000 g | Freq: Every day | ORAL | Status: DC

## 2015-06-06 MED ORDER — OXYCODONE HCL ER 10 MG PO T12A
10.0000 mg | EXTENDED_RELEASE_TABLET | Freq: Two times a day (BID) | ORAL | Status: DC
  Administered 2015-06-06 – 2015-06-09 (×7): 10 mg via ORAL
  Filled 2015-06-06 (×7): qty 1

## 2015-06-06 MED ORDER — MORPHINE SULFATE 10 MG/5ML PO SOLN
5.0000 mg | ORAL | Status: DC | PRN
  Administered 2015-06-07: 5 mg via ORAL
  Administered 2015-06-08: 10 mg via ORAL
  Filled 2015-06-06 (×2): qty 5

## 2015-06-06 MED ORDER — ALUM & MAG HYDROXIDE-SIMETH 200-200-20 MG/5ML PO SUSP: 30.0000 mL | Freq: Four times a day (QID) | ORAL | Status: DC | PRN

## 2015-06-06 MED ORDER — BOOST / RESOURCE BREEZE PO LIQD
1.0000 | Freq: Three times a day (TID) | ORAL | Status: DC
  Administered 2015-06-06 – 2015-06-08 (×5): 1 via ORAL
  Administered 2015-06-09: 11:00:00 via ORAL

## 2015-06-06 MED ORDER — ONDANSETRON HCL 4 MG/2ML IJ SOLN: 4.0000 mg | Freq: Four times a day (QID) | INTRAMUSCULAR | Status: DC | PRN

## 2015-06-06 MED ORDER — SENNOSIDES-DOCUSATE SODIUM 8.6-50 MG PO TABS
2.0000 | ORAL_TABLET | Freq: Every day | ORAL | Status: DC
  Filled 2015-06-06: qty 2

## 2015-06-06 MED ORDER — DOCUSATE SODIUM 100 MG PO CAPS
100.0000 mg | ORAL_CAPSULE | Freq: Every evening | ORAL | Status: DC
  Administered 2015-06-06: 100 mg via ORAL
  Filled 2015-06-06: qty 1

## 2015-06-06 MED ORDER — ENOXAPARIN SODIUM 40 MG/0.4ML ~~LOC~~ SOLN
40.0000 mg | SUBCUTANEOUS | Status: DC
  Administered 2015-06-06 – 2015-06-08 (×3): 40 mg via SUBCUTANEOUS
  Filled 2015-06-06 (×3): qty 0.4

## 2015-06-06 MED ORDER — ACETAMINOPHEN 325 MG PO TABS: 650.0000 mg | ORAL_TABLET | Freq: Four times a day (QID) | ORAL | Status: DC | PRN

## 2015-06-06 MED ORDER — POLYETHYLENE GLYCOL 3350 17 G PO PACK
17.0000 g | PACK | Freq: Every day | ORAL | Status: DC
  Administered 2015-06-06 – 2015-06-08 (×3): 17 g via ORAL
  Filled 2015-06-06 (×4): qty 1

## 2015-06-06 MED ORDER — ACETAMINOPHEN 650 MG RE SUPP: 650.0000 mg | Freq: Four times a day (QID) | RECTAL | Status: DC | PRN

## 2015-06-06 MED ORDER — PANTOPRAZOLE SODIUM 40 MG PO TBEC
40.0000 mg | DELAYED_RELEASE_TABLET | Freq: Every day | ORAL | Status: DC
  Administered 2015-06-06 – 2015-06-08 (×3): 40 mg via ORAL
  Filled 2015-06-06 (×4): qty 1

## 2015-06-06 NOTE — Progress Notes (Signed)
Ashley Green consented seroquel prescription due to MD kale pending medicaid pre-authorization.

## 2015-06-06 NOTE — Progress Notes (Signed)
Patient speaks spanish, no [place].  Will need an interpreter if daughter is not with patient

## 2015-06-06 NOTE — Progress Notes (Signed)
.    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   HISTORY OF PRESENTING ILLNESS: Please see my previous clinic note for details of initial presentation.  Interval History  Ashley Green is here for follow-up with her daughter. She received her first cycle of Nivolumab on 05/26/2015 has been getting palliative radiation to her paraspinal thoracic masses and right upper lobe lung lesion. She presents with change in mental status. Her daughter notes that she has been more confused and has had difficult time sleeping at night suggestive of hyperactive delirium. She has been eating her food and has been moving her bowels but notes that people are not feeding her. She was difficult to control at home due to her hyperactivity and insomnia. Patient's daughter notes that she has been using her pain medications as prescribed. No fevers or chills. Patient notes some increased abdominal distention and upper abdominal discomfort. Also notes some new bilateral lower extremity swelling. Labs done today showed some anemia with hemoglobin of 8.7 and noted to have knee when worsened hyponatremia. Patient notes no headaches or new focal neurological deficits. Patient was unable to get her radiation therapy yesterday due to her confusion and inability to co-operate.  MEDICAL HISTORY:  Past Medical History  Diagnosis Date  . Hypertension   . Colon cancer   . Bowel obstruction   . GERD (gastroesophageal reflux disease) 10/15/2012    SURGICAL HISTORY: Past Surgical History  Procedure Laterality Date  . Colon surgery    . Colectomy  s/p colon cancer 2000    . Colostomy      SOCIAL HISTORY: Social History   Social  History  . Marital Status: Single    Spouse Name: N/A  . Number of Children: 3  . Years of Education: N/A   Occupational History  . retired    Social History Main Topics  . Smoking status: Never Smoker   . Smokeless tobacco: Never Used  . Alcohol Use: No  . Drug Use: No  . Sexual Activity: No   Other Topics Concern  . Not on file   Social History Narrative   Works at [employer].     FAMILY HISTORY: Family History  Problem Relation Age of Onset  . Hodgkin's lymphoma Son   . Heart attack Mother   . Brain cancer Sister   . Pneumonia Father   . Alcoholism Brother   . Pneumonia Brother     ALLERGIES:  has No Known Allergies.  MEDICATIONS:  Current Outpatient Prescriptions  Medication Sig Dispense Refill  . Cyanocobalamin (VITAMIN B 12 PO) Take 1 tablet by mouth 2 (two) times daily.    Mariane Baumgarten Calcium (STOOL SOFTENER PO) Take 1 tablet by mouth daily.    . magnesium citrate SOLN Take 0.5 Bottles by mouth once.    . morphine (ROXANOL) 20 MG/ML concentrated solution Take 0.25-0.5 mLs (5-10 mg total) by mouth every 2 (two) hours as needed for severe pain or breakthrough pain. 200 mL 0  . omeprazole (PRILOSEC) 40 MG capsule Take 1 capsule (40 mg total) by mouth daily. 30 capsule 3  . ondansetron (ZOFRAN) 8 MG tablet Take 0.5 tablets (4 mg total) by mouth every 6 (six) hours as needed for nausea or vomiting. 30 tablet 3  . ondansetron (  ZOFRAN-ODT) 4 MG disintegrating tablet Take 1 tablet (4 mg total) by mouth every 8 (eight) hours as needed for nausea or vomiting. (Patient taking differently: Take 4 mg by mouth daily as needed for nausea or vomiting. ) 20 tablet 0  . Ostomy Supplies (NATURA STOMAHESIVE MOLDABLE) WAFR 1 each by Does not apply route 2 (two) times daily. 60 Wafer 12  . OxyCODONE (OXYCONTIN) 10 mg T12A 12 hr tablet Take 1 tablet (10 mg total) by mouth every 12 (twelve) hours. 60 tablet 0  . polyethylene glycol powder (MIRALAX) powder Take 17 g by mouth 2  (two) times daily. (Patient taking differently: Take 17 g by mouth daily. ) 255 g 3  . senna-docusate (SENNA S) 8.6-50 MG per tablet Take 2 tablets by mouth at bedtime. 60 tablet 1   No current facility-administered medications for this visit.    REVIEW OF SYSTEMS:    New leg Swelling, upper abdominal pain and abdominal distention.   PHYSICAL EXAMINATION: ECOG PERFORMANCE STATUS: 1 - Symptomatic but completely ambulatory  Filed Vitals:   06/06/15 1203  BP: 134/48  Pulse: 107  Temp: 98.8 F (37.1 C)  Resp: 16   Filed Weights   06/06/15 1203  Weight: 101 lb (45.813 kg)    GENERAL:alert, mild distress due to left upper back pain. Appears significantly more confused than baseline increase activity level and lack of sleep. SKIN: Palpable left upper back mass is firm with some tenderness to palpation no redness. One dominant and a few small right abdominal wall nodules. EYES: normal, conjunctiva mild pallor, sclera clear anicteric  OROPHARYNX:no exudate, no erythema and lips, buccal mucosa, and tongue normal  NECK: supple, thyroid normal size, non-tender, without nodularity LYMPH:  no palpable lymphadenopathy in cervical, axillary or inguinal LUNGS: Few rt mid/upper zone rales, no rhonci HEART: regular rate & rhythm and no murmurs  ABDOMEN:abdomen soft, non-tender and normal bowel sounds Musculoskeletal b/l lower extremity 2+ pitting pedal edema PSYCH: alert & oriented x 3 with fluent speech NEURO: confused, hyperactive but directable, no focal motor/sensory deficits  LABORATORY DATA:  . CBC Latest Ref Rng 06/06/2015 05/24/2015 05/23/2015  WBC 3.9 - 10.3 10e3/uL 11.3(H) 17.2(H) 15.1(H)  Hemoglobin 11.6 - 15.9 g/dL 8.7(L) 10.1(L) 9.9(L)  Hematocrit 34.8 - 46.6 % 26.2(L) 29.8(L) 28.9(L)  Platelets 145 - 400 10e3/uL 422(H) 501(H) 549(H)    Recent Labs  04/17/15 1610  05/18/15 1058 05/24/15 0949 06/06/15 1130  NA 137  < > 130* 134* 123*  K 4.6  < > 4.6 5.1 4.7  CL 100  --    --   --   --   CO2 26  < > '23 26 23  ' GLUCOSE 93  < > 173* 163* 125  BUN 17  < > 9.7 16.3 13.6  CREATININE 0.57*  < > 0.7 0.7 0.6  CALCIUM 9.3  < > 9.6 9.6 8.7  PROT 6.6  < > 6.8 6.8 6.1*  ALBUMIN 3.7  < > 2.6* 2.5* 2.1*  AST 11  < > 15 13 33  ALT 12  < > '22 18 28  ' ALKPHOS 76  < > 96 110 104  BILITOT 0.2  < > 0.43 0.35 0.44  < > = values in this interval not displayed. . Lab Results  Component Value Date   CA199 247.6* 04/21/2015   . Lab Results  Component Value Date   CEA 3.6 04/21/2015    . Lab Results  Component Value Date   LDH 186 05/04/2015  RADIOGRAPHIC STUDIES: I have personally reviewed the radiological images as listed and agreed with the findings in the report. Mr Jeri Cos Wo Contrast  05/10/2015   CLINICAL DATA:  Patient fell 1 month ago. Unsure if she struck head. History of colon cancer. Staging.  EXAM: MRI HEAD WITHOUT CONTRAST  TECHNIQUE: Multiplanar, multiecho pulse sequences of the brain and surrounding structures were obtained without intravenous contrast.  COMPARISON:  MR head 03/22/2011.  FINDINGS: Patient was unable to tolerate standard MRI brain imaging with and without contrast. No contrast could be administered. Only a few sequences were obtained. Some of these are motion degraded.  Sagittal T1 weighted images demonstrate no midline abnormality. There is mild cervical spondylosis without osseous destruction.  On diffusion-weighted imaging, there is a small 3 mm focus of restricted diffusion, in the LEFT frontal subcortical white matter as seen on image 35 series 4, which could represent ischemia or small metastasis. No definite surrounding vasogenic edema on T2 imaging. No other definite areas of restricted diffusion are observed.  Generalized atrophy. Moderately advanced T2 and FLAIR hyperintensities representing small vessel disease. T2 and FLAIR axial images are insufficiently detailed, with too much motion, to allow detailed correlation with the LEFT  frontal diffusion abnormality.  IMPRESSION: Suboptimal exam secondary to patient motion, and inability to tolerate the routine sequences required for pre and postcontrast imaging. No post infusion imaging was obtained.  3 mm focus of restricted diffusion LEFT frontal subcortical white matter could represent a small acute infarct or mass. See discussion above.  Generalized atrophy with small vessel disease.  Consider repeat imaging with sedation or pain medicine to allow complete assessment of the brain with and without contrast.  These results will be called to the ordering clinician or representative by the Radiologist Assistant, and communication documented in the PACS or zVision Dashboard.   Electronically Signed   By: Staci Righter M.D.   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  Request made for repeat biopsy of an additional hypermetabolic subcutaneous nodule within the subcutaneous tissues of the right upper abdominal quadrant for EGFR mutation testing, alk gene rearrangement and Ros 1 testing.  EXAM: ULTRASOUND GUIDED BIOPSY OF HYPERMETABOLIC NODULE WITHIN THE SUBCUTANEOUS TISSUES WITHIN THE RIGHT UPPER ABDOMEN  COMPARISON:  PET-CT - 05/04/2015; ultrasound-guided biopsy of dominant hypermetabolic mass with the subcutaneous tissues of the back - 05/08/2015  MEDICATIONS: None  ANESTHESIA/SEDATION: Conscious sedation was achieved with intravenous Versed and Fentanyl.  Total Moderate Sedation time  15 minutes  COMPLICATIONS: None immediate  PROCEDURE: Informed written consent was obtained from the patient and the patient's daughter via the use of a medical translator after a discussion of the risks, benefits and alternatives to treatment. The patient understands and consents  the procedure. A timeout was performed prior to the initiation of the procedure.  Ultrasound scanning was performed of the right upper abdominal quadrant demonstrates a well-defined punctate approximately 1.3 x 1.2 cm hypoechoic nodule within the subcutaneous tissues of the right anterior lateral abdomen compatible with the hypermetabolic nodule seen on preceding PET-CT.  The right upper abdominal quadrant was prepped and draped in the usual sterile fashion. The overlying soft tissues were anesthetized with 1% lidocaine with epinephrine. This was followed by 6 core biopsies with an 18 gauge core device under direct ultrasound guidance. Multiple ultrasound  images were saved for procedural documentation purposes.  Hemostasis was achieved with manual compression. Post procedural scanning was negative for definitive area of hemorrhage or additional complication. A dressing was placed. The patient tolerated the procedure well without immediate post procedural complication.  IMPRESSION: Technically successful ultrasound guided core needle biopsy of hypermetabolic nodule within the subcutaneous tissues of the right anterior lateral abdomen.   Electronically Signed   By: Sandi Mariscal M.D.   On: 05/23/2015 15:04   US Biopsy  05/08/2015   CLINICAL DATA:  81 year old female with a history of colon cancer and now with multifocal metastatic disease of uncertain etiology. There is a larger lesion in the right upper lobe of the lung as well as a lesion in the pancreatic head. Differential considerations include primary lung, pancreas and colon cancer. Ultrasound-guided core biopsy is warranted to facilitate tissue diagnosis.  EXAM: ULTRASOUND BIOPSY CORE LIVER  Date: 05/08/2015  PROCEDURE: 1. Ultrasound-guided core biopsy of paraspinal soft tissue mass. Interventional Radiologist:  Criselda Peaches, MD  ANESTHESIA/SEDATION: Moderate (conscious) sedation was used. 0.5 mg Versed, 25 mcg Fentanyl were administered intravenously.  The patient's vital signs were monitored continuously by radiology nursing throughout the procedure.  Sedation Time: 8 minutes  MEDICATIONS: None additional  TECHNIQUE: Informed consent was obtained from the patient following explanation of the procedure, risks, benefits and alternatives. The patient understands, agrees and consents for the procedure. All questions were addressed. A time out was performed.  The left paraspinal soft tissues were interrogated with ultrasound. An approximately 4.2 by 2.0 complex hypoechoic soft tissue mass can be identified. A suitable skin entry site was selected and marked. The region was then sterilely prepped and draped in the standard fashion using Betadine skin prep. Local anesthesia was attained by infiltration with 1% lidocaine. A small dermatotomy was made. Under real-time sonographic guidance, multiple 18 gauge core biopsies were obtained using the bio Pince automated biopsy device. Biopsy specimens were placed in saline and delivered to the lab for further evaluation.  Post biopsy imaging demonstrates no evidence of complication. The biopsy tracts remain visible within the mass. The patient tolerated the procedure well.  COMPLICATIONS: None  Estimated blood loss: 0  IMPRESSION: Technically successful ultrasound-guided core biopsy of left paraspinal mass.  Signed,  Criselda Peaches, MD  [facility]  [facility]   Electronically Signed   By: Jacqulynn Cadet M.D.   On: 05/08/2015 11:29    ASSESSMENT & PLAN:   Ashley Green is a very pleasant 81 year old Hispanic female in good overall health with ECOG performance status of 1 and previous history of colon cancer in 2000 and 2001 now with  #1 Stage IV Metastatic poorly differentiated lung carcinoma with sarcomatoid features with  mediastinal adenopathy, metastases to bilateral adrenal glands, paraspinal muscles and pancreas as well as abdominal lymph nodes and skin.  CEA  level within normal limits. CA-19-9 level somewhat elevated likely due to metastases to the pancreas and less consistent with primary pancreatic metastatic cancer. LDH level within normal limits. Good ECOG performance status is 1 #2  Painful metastases to the spinal muscles over upper back. #3 Shortness of breath due to her lung mass causing some airway compression. #4  Symptomatic anemia due to her metastatic cancer. #5 Pain due to her cancer #6 Microcytic anemia due to anemia of chronic disease ferritin level 171.  #7 Bilateral adrenal metastases with high risk for adrenal insufficiency. #8 dysphagia -no intraluminal masses on esophagogram . This symptom is  likely from extrinsic compression from right upper lobe lung mass. #9 history of colorectal cancer diagnosed in 2000 treated with surgery followed by chemotherapy and radiation for her daughter. She had recurrence in 2001 when she was treated with further surgery and intraoperative radiation.  #10 Delirium - patient has an acute change in mental status with hyperactivity suggestive of delirium. This could be due to her electrolyte abnormalities notably the hyponatremia, pain, pain medications. We'll need to evaluate her abdominal pain and distention. Rule out infectious processes. Optimize her pain management. Plan   -Hyponatremia workup with serum osmolality, urine loss modality, urine sodium, cortisol to determine if this is related to SIADH from her lung mass versus dehydration. -Might need to consider 3% NACL for gradual correction of sodium by about 0.1mq an hour to 130 - Would recommend getting a CT scan of the brain without contrast to rule out brain metastases -X-ray chest -Lipase and amylase levels and CT of the abdomen to work or abdominal distention. If constipated will need to optimize bowel regimen. -For pain management would discontinue her OxyContin and morphine and use equally and adjusting doses of fentanyl or  Dilaudid. -Ultrasound bilateral lower extremity venous to rule out DVT. -Regular diet -Might consider low-dose Haldol for delirium [id number] prn -Nutritional consult -Her dexamethasone was recently discontinued. -If hypotensive or hypoglycemic might need to consider restarting low-dose steroids. -Continue daily palliative radiation therapy -We will adjust her next cycle of Nivolumab lab on 06/09/2015 if needed. -Followed UA and UC symptom clinic.  Patient will be admitted to the hospital today to the hospitalist service and we will continue to follow was consult. Patient's care was discussed with Dr AnHuey BienenstockD MSHockinsonCMidwest Center For Day SurgeryTProhealth Ambulatory Surgery Center IncCGainesville Surgery Centerematology/Oncology Physician CoSantel(Office):       [phone] cell):  [phone]):           [phone]

## 2015-06-06 NOTE — Telephone Encounter (Signed)
[name] with Patricia Nettle, patient can lay still for rad tx today per his statement,can be transferred via bed, thanked RN and called [place]# [phone], informed [place] therapist to treat patient per MD 4:19 PM

## 2015-06-06 NOTE — H&P (Addendum)
[facility] History and Physical  Ashley Green CVE:[id number] DOB: [birth year] DOA: 06/06/2015   PCP: [name], DO    Chief Complaint: delirium, sent for hyponatremia  HPI: Ashley Green is a 81 y.o. female  with metastatic adenocarcinoma of the lung, recent PET scan showing hypermetabolic lesions in chest abdomen pelvis (adrenal glands, pancreas, pelvic lymph nodes, anterior abdominal wall, paraspinal soft tissue in the thoracic area) with a dominant right upper lobe mass who received her first cycle of chemotherapy on 9/2 and has been receiving palliative radiation to the paraspinal thoracic metastases in the right upper lobe lesion.  She presented to the oncologists office today and was found to be confused. According to the daughter who is at bedside and translating for her, she is usually very sharp at her baseline and has no dementia. She has been confused for about 8 days now. She was not able to sleep last night and was restless and moving about the house all night.   Blood work done at the oncology office revealed a sodium of 123, and a hemoglobin that had dropped from 10.1 on 8/31-8.7. King at her past lab work she seems to run a sodium about 1:30 to 131  Her PO intake decreased about 2 wks ago. She is eating only about a quarter of her meals and has not been drinking much fluids. Very little urine output. She has had on and off abdominal pain and swelling in the abdomen for 2-3 wks- she is using MiraLAX and senna for constipation. According to the daughter she has had a large amount of stool output in her colostomy.  Legs have been swollen almost 1 wk as well.  No complaint of fever chills cough. She has chronic shortness of breath.  General: The patient denies anorexia, fever, + weight loss Cardiac: Denies chest pain, syncope, palpitations, pedal edema  Respiratory: Denies cough,  shortness of breath from underlying lung has not changed, no wheezing GI: Denies severe  indigestion/heartburn, abdominal pain, nausea, vomiting, diarrhea and constipation GU: Denies hematuria, incontinence, dysuria  Musculoskeletal: pain in mid back due to cancer lesions- can't lay flat on her back, pain in knees and legs Skin: Denies suspicious skin lesions Neurologic: Denies focal weakness or numbness, change in vision Psychiatry: + depression - has occasionally said she doesn't want to live Hematologic: no easy bruising or bleeding  All other systems reviewed and found to be negative.  Past Medical History  Diagnosis Date  . Hypertension   . Colon cancer   . Bowel obstruction   . GERD (gastroesophageal reflux disease) 10/15/2012  . Adenocarcinoma, lung     Past Surgical History  Procedure Laterality Date  . Colon surgery    . Colectomy  s/p colon cancer 2000    . Colostomy      Social History: Social History   Social History  . Marital Status: Single    Spouse Name: N/A  . Number of Children: 3  . Years of Education: N/A   Occupational History  . retired    Social History Main Topics  . Smoking status: Never Smoker   . Smokeless tobacco: Never Used  . Alcohol Use: No  . Drug Use: No  . Sexual Activity: No   Other Topics Concern  . Not on file   Social History Narrative   Works at [employer].      No Known Allergies  Family history:   Family History  Problem Relation Age of Onset  . Hodgkin's  lymphoma Son   . Heart attack Mother   . Brain cancer Sister   . Pneumonia Father   . Alcoholism Brother   . Pneumonia Brother       Prior to Admission medications   Medication Sig Start Date End Date Taking? Authorizing Provider  Cyanocobalamin (VITAMIN B 12 PO) Take 1 tablet by mouth 2 (two) times daily.    Historical Provider, MD  Docusate Calcium (STOOL SOFTENER PO) Take 1 tablet by mouth daily.    Historical Provider, MD  magnesium citrate SOLN Take 0.5 Bottles by mouth once.    Historical Provider, MD  morphine (ROXANOL) 20  MG/ML concentrated solution Take 0.25-0.5 mLs (5-10 mg total) by mouth every 2 (two) hours as needed for severe pain or breakthrough pain. 05/18/15   Carlton Adam, PA-C  omeprazole (PRILOSEC) 40 MG capsule Take 1 capsule (40 mg total) by mouth daily. 10/15/12   Dayarmys Piloto de [name], MD  ondansetron (ZOFRAN) 8 MG tablet Take 0.5 tablets (4 mg total) by mouth every 6 (six) hours as needed for nausea or vomiting. 05/18/15   Carlton Adam, PA-C  ondansetron (ZOFRAN-ODT) 4 MG disintegrating tablet Take 1 tablet (4 mg total) by mouth every 8 (eight) hours as needed for nausea or vomiting. Patient taking differently: Take 4 mg by mouth daily as needed for nausea or vomiting.  04/20/15   [name], MD  Ostomy Supplies (NATURA STOMAHESIVE MOLDABLE) [facility] 1 each by Does not apply route 2 (two) times daily. 02/24/13   Dayarmys [place] de [name], MD  OxyCODONE (OXYCONTIN) 10 mg T12A 12 hr tablet Take 1 tablet (10 mg total) by mouth every 12 (twelve) hours. 05/18/15   Carlton Adam, PA-C  polyethylene glycol powder (MIRALAX) powder Take 17 g by mouth 2 (two) times daily. Patient taking differently: Take 17 g by mouth daily.  05/02/15   Sulphur N Rumley, DO  senna-docusate (SENNA S) 8.6-50 MG per tablet Take 2 tablets by mouth at bedtime. 05/05/15   Brunetta Genera, MD     Physical Exam: Filed Vitals:   06/06/15 1328  BP: 125/49  Pulse: 98  Temp: 98.2 F (36.8 C)  TempSrc: Oral  Resp: 20  SpO2: 100%     General: Awake and alert in no acute distress-oriented to place and person but not to time which is unusual for her per her daughter HEENT: Normocephalic and Atraumatic, Mucous membranes pink- oral mucosa dry                PERRLA; EOM intact; No scleral icterus,                 Nares: Patent, Oropharynx: Clear, Fair Dentition                 Neck: FROM, no cervical lymphadenopathy, thyromegaly, carotid bruit or JVD;  Breasts: deferred CHEST WALL: No tenderness  CHEST: Normal  respiration, clear to auscultation bilaterally  HEART: Regular rate and rhythm; no murmurs rubs or gallops  BACK: No kyphosis or scoliosis; no CVA tenderness  GI: Positive Bowel Sounds, soft, tender left upper quadrant; large mass in right lower quadrant, smaller mass in right upper quadrant mass -as are firm and nontender Rectal Exam: deferred MSK: No cyanosis, clubbing 2+ pitting edema Genitalia: not examined  SKIN:  no rash or ulceration  CNS: Alert and Oriented x 4, Nonfocal exam, CN 2-12 intact  Labs on Admission:  Basic Metabolic Panel:  Recent Labs Lab  06/06/15 1130  NA 123*  K 4.7  CO2 23  GLUCOSE 125  BUN 13.6  CREATININE 0.6  CALCIUM 8.7  MG 1.7   Liver Function Tests:  Recent Labs Lab 06/06/15 1130  AST 33  ALT 28  ALKPHOS 104  BILITOT 0.44  PROT 6.1*  ALBUMIN 2.1*   No results for input(s): LIPASE, AMYLASE in the last 168 hours. No results for input(s): AMMONIA in the last 168 hours. CBC:  Recent Labs Lab 06/06/15 1129  WBC 11.3*  NEUTROABS 9.9*  HGB 8.7*  HCT 26.2*  MCV 72.2*  PLT 422*   Cardiac Enzymes: No results for input(s): CKTOTAL, CKMB, CKMBINDEX, TROPONINI in the last 168 hours.  BNP (last 3 results) No results for input(s): BNP in the last 8760 hours.  ProBNP (last 3 results) No results for input(s): PROBNP in the last 8760 hours.  CBG: No results for input(s): GLUCAP in the last 168 hours.  Radiological Exams on Admission: No results found.  EKG: Independently reviewed. ordered  Assessment/Plan Principal Problem:   Hyponatremia/    - From the history I would suspect it may be secondary to dehydration from poor by mouth intake and possibly also increased stool output -She may also have SIADH in relation to her lung cancer -Her legs are swollen oral mucosa is quite dry and therefore I'm doubtful that she is fluid overloaded -Obtain urine sodium and urine osmolality-follow-up on this prior to deciding whether to give her IV  fluids -possible Hypothyroidism ?  her TSH is quite elevated although it is better than when last checked last month-free T4 was normal-we'll recheck free T4 - has adrenal metastasis- check for adrenal insufficiency with Cortisol and orthostatic vitals  Active Problems:  Acute encephalopathy -Possibly related to above hyponatremia -No signs of infection per history but will check a UA and a chest x-ray - We'll obtain a CT of the brain stat  Abdominal pain -Per PET scan, a mass was present in the right ilio colonic mesentery measuring 5 x 4 cm)  - the mass in the right upper quadrant is likely a metastases swell-we'll obtain an abdominal x-ray to determine if she has bowel obstruction- it that doubtful that she has a total obstruction as she continues to make stool but if there is a mass present within her bowel, this may be causing cramping and the resultant abdominal pain  Pedal edema - Possibly as a result of protein calorie malnutrition/ hypoalbuminemia- alb is 2.1 - check BNP and chest x-ray for pulmonary edema- may need to get ECHO to r/o right heart failure - hold off on ordering venous duplex for today    Adenocarcinoma, lung - With numerous metastasis- adrenal glands, pancreas, pelvic lymph nodes, anterior abdominal wall, paraspinal soft tissue in the thoracic area, a dominant right upper lobe mass -receiving chemotherapy per Dr. Belia Heman received 1 cycle -also receives radiation to her back daily    Anemia of chronic disease - Appears to be getting worse- follow    Back pain - Takes OxyContin and Roxanol for breakthrough pain   Consulted: none  Code Status: DO NOT RESUSCITATE-discussed with patient in presence of daughter  Family Communication: Daughter Beryle Flock- number in chart  DVT Prophylaxis: Lovenox  Time spent: 82 min  Whitney, MD [facility]  If 7PM-7AM, please contact night-coverage [email or web address] 06/06/2015, 2:39 PM

## 2015-06-06 NOTE — Telephone Encounter (Signed)
Per Dr. Grier Mitts desk nurse moved lab/fu to 11am due to possible admission. Spoke with dtr [name] and patient will be here for 11 am appointment.

## 2015-06-07 ENCOUNTER — Inpatient Hospital Stay (HOSPITAL_COMMUNITY): Payer: Medicaid Other

## 2015-06-07 ENCOUNTER — Other Ambulatory Visit: Payer: Medicaid Other

## 2015-06-07 ENCOUNTER — Ambulatory Visit
Admission: RE | Admit: 2015-06-07 | Discharge: 2015-06-07 | Disposition: A | Discharge status: Home / Self Care | Payer: Medicaid Other | Source: Ambulatory Visit | Attending: Radiation Oncology | Admitting: Radiation Oncology

## 2015-06-07 ENCOUNTER — Ambulatory Visit: Payer: Medicaid Other

## 2015-06-07 DIAGNOSIS — R410 Disorientation, unspecified: Secondary | ICD-10-CM

## 2015-06-07 DIAGNOSIS — C7971 Secondary malignant neoplasm of right adrenal gland: Secondary | ICD-10-CM

## 2015-06-07 DIAGNOSIS — R770 Abnormality of albumin: Secondary | ICD-10-CM

## 2015-06-07 DIAGNOSIS — M7989 Other specified soft tissue disorders: Secondary | ICD-10-CM

## 2015-06-07 DIAGNOSIS — D509 Iron deficiency anemia, unspecified: Secondary | ICD-10-CM

## 2015-06-07 DIAGNOSIS — R600 Localized edema: Secondary | ICD-10-CM

## 2015-06-07 DIAGNOSIS — R0602 Shortness of breath: Secondary | ICD-10-CM

## 2015-06-07 DIAGNOSIS — C3490 Malignant neoplasm of unspecified part of unspecified bronchus or lung: Secondary | ICD-10-CM

## 2015-06-07 DIAGNOSIS — E871 Hypo-osmolality and hyponatremia: Principal | ICD-10-CM

## 2015-06-07 DIAGNOSIS — C7972 Secondary malignant neoplasm of left adrenal gland: Secondary | ICD-10-CM

## 2015-06-07 DIAGNOSIS — D630 Anemia in neoplastic disease: Secondary | ICD-10-CM

## 2015-06-07 DIAGNOSIS — Z510 Encounter for antineoplastic radiation therapy: Secondary | ICD-10-CM | POA: Diagnosis not present

## 2015-06-07 DIAGNOSIS — G9340 Encephalopathy, unspecified: Secondary | ICD-10-CM

## 2015-06-07 DIAGNOSIS — C772 Secondary and unspecified malignant neoplasm of intra-abdominal lymph nodes: Secondary | ICD-10-CM

## 2015-06-07 LAB — BASIC METABOLIC PANEL
ANION GAP: 8 (ref 5–15)
Anion gap: 10 (ref 5–15)
Anion gap: 10 (ref 5–15)
Anion gap: 9 (ref 5–15)
Anion gap: 10 (ref 5–15)
BUN: 12 mg/dL (ref 6–20)
BUN: 12 mg/dL (ref 6–20)
BUN: 12 mg/dL (ref 6–20)
BUN: 11 mg/dL (ref 6–20)
BUN: 11 mg/dL (ref 6–20)
CHLORIDE: 91 mmol/L — AB (ref 101–111)
CHLORIDE: 92 mmol/L — AB (ref 101–111)
CHLORIDE: 95 mmol/L — AB (ref 101–111)
CHLORIDE: 96 mmol/L — AB (ref 101–111)
CHLORIDE: 96 mmol/L — AB (ref 101–111)
CO2: 22 mmol/L (ref 22–32)
CO2: 22 mmol/L (ref 22–32)
CO2: 22 mmol/L (ref 22–32)
CO2: 22 mmol/L (ref 22–32)
CO2: 25 mmol/L (ref 22–32)
Calcium: 8 mg/dL — ABNORMAL LOW (ref 8.9–10.3)
Calcium: 7.8 mg/dL — ABNORMAL LOW (ref 8.9–10.3)
Calcium: 7.8 mg/dL — ABNORMAL LOW (ref 8.9–10.3)
Calcium: 8 mg/dL — ABNORMAL LOW (ref 8.9–10.3)
Calcium: 7.8 mg/dL — ABNORMAL LOW (ref 8.9–10.3)
Creatinine, Ser: 0.47 mg/dL (ref 0.44–1.00)
Creatinine, Ser: 0.41 mg/dL — ABNORMAL LOW (ref 0.44–1.00)
Creatinine, Ser: 0.41 mg/dL — ABNORMAL LOW (ref 0.44–1.00)
Creatinine, Ser: 0.5 mg/dL (ref 0.44–1.00)
Creatinine, Ser: 0.44 mg/dL (ref 0.44–1.00)
GFR calc Af Amer: >60 mL/min (ref >60)
GFR calc Af Amer: >60 mL/min (ref >60)
GFR calc Af Amer: >60 mL/min (ref >60)
GFR calc Af Amer: >60 mL/min (ref >60)
GFR calc Af Amer: >60 mL/min (ref >60)
GFR calc non Af Amer: >60 mL/min (ref >60)
GFR calc non Af Amer: >60 mL/min (ref >60)
GFR calc non Af Amer: >60 mL/min (ref >60)
GFR calc non Af Amer: >60 mL/min (ref >60)
GLUCOSE: 96 mg/dL (ref 65–99)
GLUCOSE: 99 mg/dL (ref 65–99)
GLUCOSE: 88 mg/dL (ref 65–99)
GLUCOSE: 105 mg/dL — AB (ref 65–99)
GLUCOSE: 92 mg/dL (ref 65–99)
POTASSIUM: 4.4 mmol/L (ref 3.5–5.1)
POTASSIUM: 4.2 mmol/L (ref 3.5–5.1)
POTASSIUM: 4.3 mmol/L (ref 3.5–5.1)
POTASSIUM: 4.1 mmol/L (ref 3.5–5.1)
POTASSIUM: 3.9 mmol/L (ref 3.5–5.1)
Sodium: 123 mmol/L — ABNORMAL LOW (ref 135–145)
Sodium: 124 mmol/L — ABNORMAL LOW (ref 135–145)
Sodium: 126 mmol/L — ABNORMAL LOW (ref 135–145)
Sodium: 128 mmol/L — ABNORMAL LOW (ref 135–145)
Sodium: 129 mmol/L — ABNORMAL LOW (ref 135–145)

## 2015-06-07 LAB — CBC
HCT: 23.2 % — ABNORMAL LOW (ref 36.0–46.0)
Hemoglobin: 8 g/dL — ABNORMAL LOW (ref 12.0–15.0)
MCH: 24.5 pg — AB (ref 26.0–34.0)
MCHC: 34.5 g/dL (ref 30.0–36.0)
MCV: 71.2 fL — AB (ref 78.0–100.0)
PLATELETS: 288 10*3/uL (ref 150–400)
RBC: 3.26 MIL/uL — AB (ref 3.87–5.11)
RDW: 18.7 % — ABNORMAL HIGH (ref 11.5–15.5)
WBC: 10.9 10*3/uL — ABNORMAL HIGH (ref 4.0–10.5)

## 2015-06-07 LAB — LIPASE, BLOOD: Lipase: 27 U/L (ref 22–51)

## 2015-06-07 LAB — OSMOLALITY, URINE: OSMOLALITY UR: 338 mosm/kg — AB (ref 390–1090)

## 2015-06-07 LAB — AMYLASE: AMYLASE: 37 U/L (ref 28–100)

## 2015-06-07 MED ORDER — HALOPERIDOL 0.5 MG PO TABS
0.5000 mg | ORAL_TABLET | Freq: Two times a day (BID) | ORAL | Status: DC | PRN
  Filled 2015-06-07: qty 2

## 2015-06-07 MED ORDER — FUROSEMIDE 10 MG/ML IJ SOLN
20.0000 mg | Freq: Once | INTRAMUSCULAR | Status: AC
  Administered 2015-06-07: 20 mg via INTRAVENOUS
  Filled 2015-06-07: qty 2

## 2015-06-07 MED ORDER — SENNOSIDES-DOCUSATE SODIUM 8.6-50 MG PO TABS
2.0000 | ORAL_TABLET | Freq: Two times a day (BID) | ORAL | Status: DC
  Administered 2015-06-07 – 2015-06-08 (×4): 2 via ORAL
  Filled 2015-06-07 (×5): qty 2

## 2015-06-07 MED ORDER — MAGNESIUM CITRATE PO SOLN
1.0000 | Freq: Once | ORAL | Status: AC
  Administered 2015-06-07: 1 via ORAL
  Filled 2015-06-07: qty 296

## 2015-06-07 MED ORDER — ALBUMIN HUMAN 25 % IV SOLN
12.5000 g | Freq: Once | INTRAVENOUS | Status: AC
  Administered 2015-06-07: 12.5 g via INTRAVENOUS
  Filled 2015-06-07: qty 50

## 2015-06-07 MED ORDER — SODIUM CHLORIDE 1 G PO TABS
1.0000 g | ORAL_TABLET | Freq: Three times a day (TID) | ORAL | Status: DC
  Administered 2015-06-07 – 2015-06-08 (×7): 1 g via ORAL
  Filled 2015-06-07 (×11): qty 1

## 2015-06-07 NOTE — Progress Notes (Signed)
[facility] PROGRESS NOTE  Ashley Green KGY:[id number] DOB: Jul 25, 1928 DOA: 06/06/2015 PCP: [name], DO  Assessment/Plan: 1. Acute encephalopathy/delirium -Patient presenting with acute onset, mental status changes, confusion, inattention retrospect may be related to hyponatremia. Family reporting that she has had minimal by mouth intake over the past several weeks making hypovolemia possibility. -Providing IV fluid resuscitation -Will avoid anti-psychotics and benzodiazepine therapy which could worsen symptoms -Encourage mobilization  2.  Adenocarcinoma of lung -Patient with history of metastatic adenocarcinoma with metastasis to adrenal glands, pancreas, pelvic lymph nodes, anterior abdominal wall, paraspinal soft tissue -She is currently receiving her care at the cancer center undergoing chemotherapy  3.  Hypoalbuminemia. -Has history of metastatic lung cancer which is likely contributing -Provide protein boost as tolerated  4.  Hyponatremia.  -Sodium levels improved with IV fluid resuscitation -Sodium levels improvement 128 from 123 with IV fluid resuscitation  Code Status: DNR Family Communication:  Disposition Plan: Continue supportive care,    Consultants:  PT    HPI/Subjective: Patient is an 81 year old female with history of metastatic adenocarcinoma of lung status post chemotherapy on 05/26/2015 and palliative radiation therapy to paraspinal thoracic metastasis, admitted to the medicine service on 06/06/2015. Patient presented with acute encephalopathy. Lab work revealed a sodium of 123. Family had reported decreased by mouth intake over the last several weeks. Hyponatremia felt to be secondary to SIADH versus hypovolemia given history of decreased by mouth intake. She was started on normal saline running at 75 mL's per hour.  Objective: Filed Vitals:   06/07/15 1420  BP: 102/86  Pulse: 98  Temp: 98.3 F (36.8 C)  Resp: 20    Intake/Output Summary  (Last 24 hours) at 06/07/15 1449 Last data filed at 06/07/15 1100  Gross per 24 hour  Intake    580 ml  Output    425 ml  Net    155 ml   Filed Weights   06/07/15 1420  Weight: 45.813 kg (101 lb)    Exam:   General:  Patient appears better, more awake and alert, following commands remains little confused still  Cardiovascular: Regular rate and rhythm normal S1-S2 no murmurs or gallops  Respiratory: Normal respiratory effort, lungs are clear  Abdomen: Soft nontender nondistended  Musculoskeletal: Sarcopenic, cachectic  Data Reviewed: Basic Metabolic Panel:  Recent Labs Lab 06/06/15 1130 06/06/15 1425 06/06/15 2328 06/07/15 0220 06/07/15 0649 06/07/15 1012  NA 123* 123* 123* 124* 126* 128*  K 4.7 4.4 4.4 4.2 4.3 4.1  CL  --  89* 91* 92* 95* 96*  CO2 '23 23 22 22 22 22  '$ GLUCOSE 125 133* 96 99 88 105*  BUN 13.'6 14 12 12 12 11  '$ CREATININE 0.6 0.51 0.47 0.41* 0.41* 0.50  CALCIUM 8.7 8.0* 8.0* 7.8* 7.8* 8.0*  MG 1.7  --   --   --   --   --    Liver Function Tests:  Recent Labs Lab 06/06/15 1130  AST 33  ALT 28  ALKPHOS 104  BILITOT 0.44  PROT 6.1*  ALBUMIN 2.1*    Recent Labs Lab 06/07/15 0220  LIPASE 27  AMYLASE 37   No results for input(s): AMMONIA in the last 168 hours. CBC:  Recent Labs Lab 06/06/15 1129 06/06/15 1425 06/07/15 0220  WBC 11.3* 12.0* 10.9*  NEUTROABS 9.9*  --   --   HGB 8.7* 8.5* 8.0*  HCT 26.2* 24.7* 23.2*  MCV 72.2* 71.0* 71.2*  PLT 422* 334 288   Cardiac Enzymes: No results  for input(s): CKTOTAL, CKMB, CKMBINDEX, TROPONINI in the last 168 hours. BNP (last 3 results)  Recent Labs  06/06/15 1425  BNP 81.6    ProBNP (last 3 results) No results for input(s): PROBNP in the last 8760 hours.  CBG: No results for input(s): GLUCAP in the last 168 hours.  No results found for this or any previous visit (from the past 240 hour(s)).   Studies: Ct Head Wo Contrast  06/06/2015   CLINICAL DATA:  Confusion starting  yesterday. Metastatic adenocarcinoma of the lung.  EXAM: CT HEAD WITHOUT CONTRAST  TECHNIQUE: Contiguous axial images were obtained from the base of the skull through the vertex without intravenous contrast.  COMPARISON:  Brain MR off 03/22/2011  FINDINGS: Sinuses/Soft tissues: Hypoplastic right frontal sinus. Other paranasal sinuses and mastoid air cells are clear.  Intracranial: Expected cerebral and cerebellar volume loss for age. Relatively mild for age low density in the periventricular white matter likely related to small vessel disease. No mass lesion, hemorrhage, hydrocephalus, acute infarct, intra-axial, or extra-axial fluid collection.  IMPRESSION: 1.  No acute intracranial abnormality. 2. Mild cerebral atrophy and small vessel ischemic change.   Electronically Signed   By: Abigail Miyamoto M.D.   On: 06/06/2015 19:28   Dg Abd Acute W/chest  06/06/2015   CLINICAL DATA:  Nausea, vomiting, shortness of breath and weakness. History of colon cancer and lung cancer. 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 Initial encounter.  EXAM: DG ABDOMEN ACUTE W/ 1V CHEST  COMPARISON:  PET CT 05/04/2015. Acute abdominal series done 10/23/2009.  FINDINGS: Large right upper lobe mass is grossly stable from recent PET-CT. The heart size and mediastinal contours are stable. The lungs are otherwise clear.  The bowel gas pattern is nonobstructive. There is no free intraperitoneal air. A moderate amount of stool is present throughout the colon. Multiple pelvic surgical clips are present. There are degenerative changes throughout the thoracolumbar spine.  IMPRESSION: 1. Prominent stool throughout the colon suggesting constipation. 2. Stable right upper lobe lung mass. 3. No acute findings identified.   Electronically Signed   By: Richardean Sale M.D.   On: 06/06/2015 16:17    Scheduled Meds: . enoxaparin (LOVENOX) injection  40 mg Subcutaneous Q24H  .  feeding supplement  1 Container Oral TID BM  . OxyCODONE  10 mg Oral Q12H  . pantoprazole  40 mg Oral Daily  . polyethylene glycol  17 g Oral Daily  . senna-docusate  2 tablet Oral BID  . sodium chloride  1 g Oral TID WC   Continuous Infusions: . sodium chloride 75 mL/hr at 06/06/15 1830    Principal Problem:   Hyponatremia Active Problems:   Back pain   Adenocarcinoma, lung   Acute delirium   Anemia of chronic disease   Metastasis to adrenal gland   Acute encephalopathy    Time spent: 30 min    Kelvin Cellar  [facility] Pager [phone]. If 7PM-7AM, please contact night-coverage at [email or web address], password [facility] 06/07/2015, 2:49 PM  LOS: 1 day

## 2015-06-07 NOTE — Progress Notes (Signed)
Marland Kitchen   HEMATOLOGY/ONCOLOGY INPATIENT PROGRESS NOTE  Date of Service: 06/07/2015  Inpatient Attending: .Kelvin Cellar, MD   SUBJECTIVE  Mrs. Ashley was seen this afternoon. She appeared a little more oriented and energetic. Recognized me readily and was happy to see me and give me a very nice hug. Less uncomfortable.  Her sodium levels have improved with IV fluids and oral sodium tablets.  The relative with physical therapy and dietary therapy.  Noted no acute new symptoms.  Stool noted in her colostomy bag.  Leg swelling down.  Receiving IV albumin with some diuresis to help mobilize third space fluid. No fevers or chills or other signs of infection.  OBJECTIVE:  PHYSICAL EXAMINATION: . Filed Vitals:   06/06/15 2030 06/07/15 0625 06/07/15 1420 06/07/15 1755  BP: 124/51 112/48 102/86 109/41  Pulse: 103 103 98   Temp: 98.9 F (37.2 C) 98.1 F (36.7 C) 98.3 F (36.8 C) 98.9 F (37.2 C)  TempSrc: Oral Oral Oral Oral  Resp: '20 20 20   ' Height:   '4\' 11"'  (1.499 m)   Weight:   101 lb (45.813 kg)   SpO2: 99% 95% 98% 97%   Filed Weights   06/07/15 1420  Weight: 101 lb (45.813 kg)   .Body mass index is 20.39 kg/(m^2).  GENERAL:alert, appears a little more relaxed no overt pain. SKIN: Palpable left upper back mass is firm with some tenderness to palpation no redness. One dominant and a few small right abdominal wall nodules. EYES: normal, conjunctiva mild pallor, sclera clear anicteric  OROPHARYNX:no exudate, no erythema and lips, buccal mucosa, and tongue normal  NECK: supple, thyroid normal size, non-tender, without nodularity LYMPH: no palpable lymphadenopathy in cervical, axillary or inguinal LUNGS: Few rt mid/upper zone rales, no rhonci HEART: regular rate & rhythm and no murmurs  ABDOMEN:abdomen Mildly distended, stools present in her colostomy bag. Musculoskeletal b/l lower extremity 2+ pitting pedal edema PSYCH: alert & oriented x 3 with fluent speech NEURO: confused,  hyperactive but directable, no focal motor/sensory deficits  MEDICAL HISTORY:  Past Medical History  Diagnosis Date  . Hypertension   . Colon cancer   . Bowel obstruction   . GERD (gastroesophageal reflux disease) 10/15/2012  . Adenocarcinoma, lung     SURGICAL HISTORY: Past Surgical History  Procedure Laterality Date  . Colon surgery    . Colectomy  s/p colon cancer 2000    . Colostomy      SOCIAL HISTORY: Social History   Social History  . Marital Status: Single    Spouse Name: N/A  . Number of Children: 3  . Years of Education: N/A   Occupational History  . retired    Social History Main Topics  . Smoking status: Never Smoker   . Smokeless tobacco: Never Used  . Alcohol Use: No  . Drug Use: No  . Sexual Activity: No   Other Topics Concern  . Not on file   Social History Narrative   Works at [employer].     FAMILY HISTORY: Family History  Problem Relation Age of Onset  . Hodgkin's lymphoma Son   . Heart attack Mother   . Brain cancer Sister   . Pneumonia Father   . Alcoholism Brother   . Pneumonia Brother     ALLERGIES:  has No Known Allergies.  MEDICATIONS:  Scheduled Meds: . enoxaparin (LOVENOX) injection  40 mg Subcutaneous Q24H  . feeding supplement  1 Container Oral TID BM  . OxyCODONE  10 mg Oral  Q12H  . pantoprazole  40 mg Oral Daily  . polyethylene glycol  17 g Oral Daily  . senna-docusate  2 tablet Oral BID  . sodium chloride  1 g Oral TID WC   Continuous Infusions: . sodium chloride 75 mL/hr at 06/06/15 1830   PRN Meds:.acetaminophen **OR** acetaminophen, alum & mag hydroxide-simeth, morphine, ondansetron **OR** ondansetron (ZOFRAN) IV  REVIEW OF SYSTEMS:    10 Point review of Systems was done is negative except as noted above.   LABORATORY DATA:  I have reviewed the data as listed  . CBC Latest Ref Rng 06/07/2015 06/06/2015 06/06/2015  WBC 4.0 - 10.5 K/uL 10.9(H) 12.0(H) 11.3(H)  Hemoglobin 12.0 - 15.0 g/dL  8.0(L) 8.5(L) 8.7(L)  Hematocrit 36.0 - 46.0 % 23.2(L) 24.7(L) 26.2(L)  Platelets 150 - 400 K/uL 288 334 422(H)    . CMP Latest Ref Rng 06/07/2015 06/07/2015 06/07/2015  Glucose 65 - 99 mg/dL 92 105(H) 88  BUN 6 - 20 mg/dL '11 11 12  ' Creatinine 0.44 - 1.00 mg/dL 0.44 0.50 0.41(L)  Sodium 135 - 145 mmol/L 129(L) 128(L) 126(L)  Potassium 3.5 - 5.1 mmol/L 3.9 4.1 4.3  Chloride 101 - 111 mmol/L 96(L) 96(L) 95(L)  CO2 22 - 32 mmol/L '25 22 22  ' Calcium 8.9 - 10.3 mg/dL 7.8(L) 8.0(L) 7.8(L)  Total Protein 6.4 - 8.3 g/dL - - -  Total Bilirubin 0.20 - 1.20 mg/dL - - -  Alkaline Phos 40 - 150 U/L - - -  AST 5 - 34 U/L - - -  ALT 0 - 55 U/L - - -     RADIOGRAPHIC STUDIES: I have personally reviewed the radiological images as listed and agreed with the findings in the report. Ct Head Wo Contrast  06/06/2015   CLINICAL DATA:  Confusion starting yesterday. Metastatic adenocarcinoma of the lung.  EXAM: CT HEAD WITHOUT CONTRAST  TECHNIQUE: Contiguous axial images were obtained from the base of the skull through the vertex without intravenous contrast.  COMPARISON:  Brain MR off 03/22/2011  FINDINGS: Sinuses/Soft tissues: Hypoplastic right frontal sinus. Other paranasal sinuses and mastoid air cells are clear.  Intracranial: Expected cerebral and cerebellar volume loss for age. Relatively mild for age low density in the periventricular white matter likely related to small vessel disease. No mass lesion, hemorrhage, hydrocephalus, acute infarct, intra-axial, or extra-axial fluid collection.  IMPRESSION: 1.  No acute intracranial abnormality. 2. Mild cerebral atrophy and small vessel ischemic change.   Electronically Signed   By: Abigail Miyamoto M.D.   On: 06/06/2015 19:28   Mr Jeri Cos PF Contrast  05/10/2015   CLINICAL DATA:  Patient fell 1 month ago. Unsure if she struck head. History of colon cancer. Staging.  EXAM: MRI HEAD WITHOUT CONTRAST  TECHNIQUE: Multiplanar, multiecho pulse sequences of the brain and  surrounding structures were obtained without intravenous contrast.  COMPARISON:  MR head 03/22/2011.  FINDINGS: Patient was unable to tolerate standard MRI brain imaging with and without contrast. No contrast could be administered. Only a few sequences were obtained. Some of these are motion degraded.  Sagittal T1 weighted images demonstrate no midline abnormality. There is mild cervical spondylosis without osseous destruction.  On diffusion-weighted imaging, there is a small 3 mm focus of restricted diffusion, in the LEFT frontal subcortical white matter as seen on image 35 series 4, which could represent ischemia or small metastasis. No definite surrounding vasogenic edema on T2 imaging. No other definite areas of restricted diffusion are observed.  Generalized atrophy. Moderately  advanced T2 and FLAIR hyperintensities representing small vessel disease. T2 and FLAIR axial images are insufficiently detailed, with too much motion, to allow detailed correlation with the LEFT frontal diffusion abnormality.  IMPRESSION: Suboptimal exam secondary to patient motion, and inability to tolerate the routine sequences required for pre and postcontrast imaging. No post infusion imaging was obtained.  3 mm focus of restricted diffusion LEFT frontal subcortical white matter could represent a small acute infarct or mass. See discussion above.  Generalized atrophy with small vessel disease.  Consider repeat imaging with sedation or pain medicine to allow complete assessment of the brain with and without contrast.  These results will be called to the ordering clinician or representative by the Radiologist Assistant, and communication documented in the PACS or zVision Dashboard.   Electronically Signed   By: Staci Righter M.D.   On: 05/10/2015 17:15   US Biopsy  05/23/2015   INDICATION: History of metastatic poorly differentiated lung carcinoma with sarcomatoid features, post ultrasound-guided biopsy of dominant hypermetabolic  nodule within the subcutaneous tissues of the back performed on 05/08/2015.  Request made for repeat biopsy of an additional hypermetabolic subcutaneous nodule within the subcutaneous tissues of the right upper abdominal quadrant for EGFR mutation testing, alk gene rearrangement and Ros 1 testing.  EXAM: ULTRASOUND GUIDED BIOPSY OF HYPERMETABOLIC NODULE WITHIN THE SUBCUTANEOUS TISSUES WITHIN THE RIGHT UPPER ABDOMEN  COMPARISON:  PET-CT - 05/04/2015; ultrasound-guided biopsy of dominant hypermetabolic mass with the subcutaneous tissues of the back - 05/08/2015  MEDICATIONS: None  ANESTHESIA/SEDATION: Conscious sedation was achieved with intravenous Versed and Fentanyl.  Total Moderate Sedation time  15 minutes  COMPLICATIONS: None immediate  PROCEDURE: Informed written consent was obtained from the patient and the patient's daughter via the use of a medical translator after a discussion of the risks, benefits and alternatives to treatment. The patient understands and consents the procedure. A timeout was performed prior to the initiation of the procedure.  Ultrasound scanning was performed of the right upper abdominal quadrant demonstrates a well-defined punctate approximately 1.3 x 1.2 cm hypoechoic nodule within the subcutaneous tissues of the right anterior lateral abdomen compatible with the hypermetabolic nodule seen on preceding PET-CT.  The right upper abdominal quadrant was prepped and draped in the usual sterile fashion. The overlying soft tissues were anesthetized with 1% lidocaine with epinephrine. This was followed by 6 core biopsies with an 18 gauge core device under direct ultrasound guidance. Multiple ultrasound images were saved for procedural documentation purposes.  Hemostasis was achieved with manual compression. Post procedural scanning was negative for definitive area of hemorrhage or additional complication. A dressing was placed. The patient tolerated the procedure well without immediate post  procedural complication.  IMPRESSION: Technically successful ultrasound guided core needle biopsy of hypermetabolic nodule within the subcutaneous tissues of the right anterior lateral abdomen.   Electronically Signed   By: Sandi Mariscal M.D.   On: 05/23/2015 15:04   Dg Abd Acute W/chest  06/06/2015   CLINICAL DATA:  Nausea, vomiting, shortness of breath and weakness. History of colon cancer and lung cancer. 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 Initial encounter.  EXAM: DG ABDOMEN ACUTE W/ 1V CHEST  COMPARISON:  PET CT 05/04/2015. Acute abdominal series done 10/23/2009.  FINDINGS: Large right upper lobe mass is grossly stable from recent PET-CT. The heart size and mediastinal contours are stable. The lungs are otherwise clear.  The bowel gas pattern is nonobstructive. There is no free intraperitoneal air. A  moderate amount of stool is present throughout the colon. Multiple pelvic surgical clips are present. There are degenerative changes throughout the thoracolumbar spine.  IMPRESSION: 1. Prominent stool throughout the colon suggesting constipation. 2. Stable right upper lobe lung mass. 3. No acute findings identified.   Electronically Signed   By: Richardean Sale M.D.   On: 06/06/2015 16:17    ASSESSMENT & PLAN:   Mrs. 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  CEA level within normal limits. CA-19-9 level somewhat elevated likely due to metastases to the pancreas and less consistent with primary pancreatic metastatic cancer. LDH level within normal limits. Good ECOG  performance status is 1 #2 Painful metastases to the spinal muscles over upper back. #3 Shortness of breath due to her lung mass causing some airway compression. #4 Symptomatic anemia due to her metastatic cancer. #5 Pain due to her cancer #6 Microcytic anemia due to anemia of chronic disease ferritin level 171.  #7 Bilateral adrenal metastases with high risk for adrenal insufficiency. #8 dysphagia -no intraluminal masses on esophagogram . This symptom is likely from extrinsic compression from right upper lobe lung mass. #9 history of colorectal cancer diagnosed in 2000 treated with surgery followed by chemotherapy and radiation for her daughter. She had recurrence in 2001 when she was treated with further surgery and intraoperative radiation.  #10 Delirium - Likely related to hyponatremia as a function of dehydration/solute intake and some element of SIADH. No evidence of infection.  Patient was noted to be constipated based on the abdominal x-ray and is receiving laxatives to address this. Cannot rule out neuro-excitatory toxicity from opiates (morphine is typically the culprit due to greater chance of accumulation of metabolities) CT head with no evidence of brain mets #11 bilateral lower extremity swelling likely related to hypoalbuminemia from her cancer.  Plan -continue slow IV fluids and oral sodium at current dose. -Optimize oral fluid intake as per dietary input. -We'll give IV albumin with one dose of diuretics to mobilize excess free water. -Physical therapy and ambulation as much as possible. -re-orienting activities. -would avoid benzodiazepines. -might continue oral OxyContin. -We'll switch her liquid morphine to equal analgesic doses of liquid Dilaudid. --Might consider low-dose Haldol for delirium 1-64m q6h prn for hyperactive delirium. Would not exceed a dose of 2 mg. -Nutritional consult -Her dexamethasone was recently discontinued. -If hypotensive or hypoglycemic  might need to consider restarting low-dose steroids. -Continue daily palliative radiation therapy if possible to avoid break in treatment -We will adjust her next cycle of Nivolumab cycle due on 06/09/2015 if needed.  I spent 25 minutes counseling the patient face to face. The total time spent in the appointment was 35 minutes and more than 50% was on counseling and direct patient cares.    GSullivan LoneMD MCross RoadsAAHIVMS SWatsonville Community HospitalCToledo Clinic D[facility] Outpatient Surgery CenterHematology/Oncology Physician CFilutowski Eye Institute [facility] (Office):       [phone](Work cell):  [phone](Fax):           [phone] 06/07/2015 11:49 PM

## 2015-06-07 NOTE — Evaluation (Signed)
Physical Therapy Evaluation Patient Details Name: Ashley Green MRN: [id number] DOB: 10-23-1927 Today's Date: 06/07/2015   History of Present Illness  Ashley Green is a 81 y.o. female with metastatic adenocarcinoma of the lung, recent PET scan showing hypermetabolic lesions in chest abdomen pelvis (adrenal glands, pancreas, pelvic lymph nodes, anterior abdominal wall, paraspinal soft tissue in the thoracic area) with a dominant right upper lobe mass who received her first cycle of chemotherapy on 9/2 and has been receiving palliative radiation to the paraspinal thoracic metastases in the right upper lobe lesion.  Clinical Impression  Pt presents with above.  Eval limited due to language barrier, despite use of interpretor line and no family present during eval to confirm.  Pt willing to ambulate to/from restroom, however did not want to use RW therefore requires up to mod A for safety due to poor balance and stability.  Per pt she is at home alone during the day due to daughter working.  Pt would benefit from continued acute services to address deficits and PT recommend SNF for follow up unless family able to provide 24/7 S and assist.  CSW made aware.     Follow Up Recommendations SNF;Supervision/Assistance - 24 hour    Equipment Recommendations  Rolling walker with 5" wheels    Recommendations for Other Services OT consult     Precautions / Restrictions Precautions Precautions: Fall Precaution Comments: Spanish speaking only, colostomy Restrictions Weight Bearing Restrictions: No      Mobility  Bed Mobility Overal bed mobility: Needs Assistance Bed Mobility: Supine to Sit     Supine to sit: Min assist;Mod assist     General bed mobility comments: Pt requires up to mod A for getting to EOB with HOB elevated and with use of rails.  Max verbal cues and encouragement to remain at EOB as she continued to lie down several times during session.   Transfers Overall transfer level:  Needs assistance Equipment used: 1 person hand held assist Transfers: Sit to/from Stand Sit to Stand: Mod assist         General transfer comment: Mod A to increase forward weight shift and elevate buttocks into standing.  Again, cues for encouragement to participate via interpretor line.   Ambulation/Gait Ambulation/Gait assistance: Min assist;Mod assist Ambulation Distance (Feet): 15 Feet (x 2 reps) Assistive device: 1 person hand held assist Gait Pattern/deviations: Step-through pattern;Staggering right;Drifts right/left;Trunk flexed;Decreased stride length     General Gait Details: Pt very unsteady without use of RW and feel she would likely benefit from use of one, however she declined during session.  She would tend to stagger both left and right with very little stepping strategy noted to recover balance without assist from PT.   Stairs            Wheelchair Mobility    Modified Rankin (Stroke Patients Only)       Balance Overall balance assessment: Needs assistance Sitting-balance support: Feet supported Sitting balance-Leahy Scale: Fair Sitting balance - Comments: Able to maintain without support, however would continuously lie back down.    Standing balance support: During functional activity;Single extremity supported Standing balance-Leahy Scale: Poor Standing balance comment: Requires up to mod A to maintain balance during upright postures.                              Pertinent Vitals/Pain Pain Assessment: Faces Faces Pain Scale: Hurts even more Pain Location: chest and LLE  Pain  Descriptors / Indicators:  (unable to describe) Pain Intervention(s): Limited activity within patient's tolerance;Monitored during session;Repositioned;RN gave pain meds during session (RN made aware)    Home Living Family/patient expects to be discharged to:: Private residence Living Arrangements: Children Available Help at Discharge: Family;Available  PRN/intermittently (per pt via interpretor daughter works) Type of Home: House Home Access: Stairs to enter Entrance Stairs-Rails: Horticulturist, commercial of Steps: 4-5 Home Layout: One level Home Equipment: None      Prior Function Level of Independence: Needs assistance      ADL's / Homemaking Assistance Needed: Unsure at what level she was PTA, however per interpretor upon asking did she get help for bathing and dressing, pt responded "yes" but unable to elaborate.         Hand Dominance        Extremity/Trunk Assessment               Lower Extremity Assessment: Generalized weakness (difficult to formally test due to language barrier)      Cervical / Trunk Assessment: Kyphotic  Communication      Cognition Arousal/Alertness: Lethargic Behavior During Therapy: Restless Overall Cognitive Status: No family/caregiver present to determine baseline cognitive functioning       Memory: Decreased recall of precautions              General Comments General comments (skin integrity, edema, etc.): note pt with colostomy    Exercises        Assessment/Plan    PT Assessment Patient needs continued PT services  PT Diagnosis Difficulty walking;Generalized weakness;Acute pain   PT Problem List Decreased strength;Decreased activity tolerance;Decreased balance;Decreased mobility;Decreased knowledge of use of DME;Decreased safety awareness;Decreased knowledge of precautions;Pain  PT Treatment Interventions DME instruction;Gait training;Stair training;Functional mobility training;Therapeutic activities;Therapeutic exercise;Balance training;Patient/family education   PT Goals (Current goals can be found in the Care Plan section) Acute Rehab PT Goals Patient Stated Goal: n/a PT Goal Formulation: Patient unable to participate in goal setting Time For Goal Achievement: 06/14/15 Potential to Achieve Goals: Good    Frequency Min 3X/week   Barriers to discharge  Decreased caregiver support      Co-evaluation               End of Session Equipment Utilized During Treatment: Gait belt Activity Tolerance: Patient limited by pain Patient left: in chair;with chair alarm set;with nursing/sitter in room Nurse Communication: Mobility status         Time: 3159-4585 PT Time Calculation (min) (ACUTE ONLY): 28 min   Charges:   PT Evaluation $Initial PT Evaluation Tier I: 1 Procedure PT Treatments $Therapeutic Activity: 8-22 mins   PT G Codes:        Denice Bors 06/07/2015, 11:03 AM

## 2015-06-07 NOTE — Progress Notes (Signed)
*  PRELIMINARY RESULTS* Vascular Ultrasound Lower extremity venous duplex has been completed.  Preliminary findings: negative for DVT.   Landry Mellow, RDMS, RVT  06/07/2015, 9:18 AM

## 2015-06-08 ENCOUNTER — Other Ambulatory Visit: Payer: Medicaid Other

## 2015-06-08 ENCOUNTER — Ambulatory Visit: Payer: Medicaid Other

## 2015-06-08 ENCOUNTER — Ambulatory Visit
Admission: RE | Admit: 2015-06-08 | Discharge: 2015-06-08 | Disposition: A | Discharge status: Home / Self Care | Payer: Medicaid Other | Source: Ambulatory Visit | Attending: Radiation Oncology | Admitting: Radiation Oncology

## 2015-06-08 DIAGNOSIS — Z510 Encounter for antineoplastic radiation therapy: Secondary | ICD-10-CM | POA: Diagnosis not present

## 2015-06-08 DIAGNOSIS — F05 Delirium due to known physiological condition: Secondary | ICD-10-CM

## 2015-06-08 LAB — COMPREHENSIVE METABOLIC PANEL
ALK PHOS: 62 U/L (ref 38–126)
ALT: 17 U/L (ref 14–54)
ANION GAP: 7 (ref 5–15)
AST: 21 U/L (ref 15–41)
Albumin: 2.1 g/dL — ABNORMAL LOW (ref 3.5–5.0)
BUN: 11 mg/dL (ref 6–20)
CALCIUM: 7.8 mg/dL — AB (ref 8.9–10.3)
CO2: 26 mmol/L (ref 22–32)
Chloride: 98 mmol/L — ABNORMAL LOW (ref 101–111)
Creatinine, Ser: 0.45 mg/dL (ref 0.44–1.00)
Glucose, Bld: 84 mg/dL (ref 65–99)
Potassium: 3.8 mmol/L (ref 3.5–5.1)
SODIUM: 131 mmol/L — AB (ref 135–145)
TOTAL PROTEIN: 5 g/dL — AB (ref 6.5–8.1)
Total Bilirubin: 0.7 mg/dL (ref 0.3–1.2)

## 2015-06-08 LAB — CBC
HCT: 21.6 % — ABNORMAL LOW (ref 36.0–46.0)
HEMOGLOBIN: 7.3 g/dL — AB (ref 12.0–15.0)
MCH: 24 pg — AB (ref 26.0–34.0)
MCHC: 33.8 g/dL (ref 30.0–36.0)
MCV: 71.1 fL — ABNORMAL LOW (ref 78.0–100.0)
PLATELETS: 267 10*3/uL (ref 150–400)
RBC: 3.04 MIL/uL — ABNORMAL LOW (ref 3.87–5.11)
RDW: 18.7 % — ABNORMAL HIGH (ref 11.5–15.5)
WBC: 8.7 10*3/uL (ref 4.0–10.5)

## 2015-06-08 LAB — GLUCOSE, CAPILLARY: GLUCOSE-CAPILLARY: 100 mg/dL — AB (ref 65–99)

## 2015-06-08 LAB — MAGNESIUM: MAGNESIUM: 2 mg/dL (ref 1.7–2.4)

## 2015-06-08 LAB — URINE CULTURE

## 2015-06-08 LAB — PREPARE RBC (CROSSMATCH)

## 2015-06-08 MED ORDER — HYDROMORPHONE HCL 1 MG/ML PO LIQD
1.0000 mg | ORAL | Status: DC | PRN
  Administered 2015-06-08: 1 mg via ORAL
  Filled 2015-06-08: qty 2
  Filled 2015-06-08: qty 1

## 2015-06-08 MED ORDER — LIP MEDEX EX OINT
TOPICAL_OINTMENT | CUTANEOUS | Status: AC
  Administered 2015-06-08: 15:00:00
  Filled 2015-06-08: qty 7

## 2015-06-08 MED ORDER — SODIUM CHLORIDE 0.9 % IV SOLN
Freq: Once | INTRAVENOUS | Status: AC
  Administered 2015-06-08: 11:00:00 via INTRAVENOUS

## 2015-06-08 NOTE — Progress Notes (Signed)
CSW received referral for New SNF.   CSW spoke with pt daughter via telephone this morning. Pt daughter reports that pt does not have 24 hour care at home. Pt daughter is concerned about pt care needs. CSW discussed recommendation for SNF. Pt daughter reports that she feel that this is necessary and agreeable to [facility] search.  CSW completed FL2 and initiated SNF search to [facility]. Pt has Medicaid only therefore options will be limited.  CSW received bed offer from [facility] as long as hospital is willing to offer Letter of Guarantee (LOG). CSW spoke with CSW Surveyor, quantity, Nathaniel Man who confirmed that 30 day LOG approved.  CSW spoke with MD and anticipate discharge tomorrow.   Full psychosocial assessment to follow.  Alison Murray, MSW, [place] Work [phone]

## 2015-06-08 NOTE — Progress Notes (Signed)
PT Cancellation Note  Patient Details Name: Ashley Green MRN: [id number] DOB: Mar 11, 1928   Cancelled Treatment:     out of room at Radiation.  Will check back later as schedule permits.   Nathanial Rancher 06/08/2015, 3:47 PM

## 2015-06-08 NOTE — Progress Notes (Signed)
Initial Nutrition Assessment  INTERVENTION:   -Continue Boost Breeze po TID, each supplement provides 250 kcal and 9 grams of protein -When diet advanced past clears, recommend Ensure Enlive po BID, each supplement provides 350 kcal and 20 grams of protein -RD to continue to monitor for additional needs  NUTRITION DIAGNOSIS:   Inadequate oral intake related to poor appetite as evidenced by per patient/family report.  GOAL:   Patient will meet greater than or equal to 90% of their needs  MONITOR:   PO intake, Supplement acceptance, Diet advancement, Labs, Weight trends, Skin, I & O's  REASON FOR ASSESSMENT:   Consult Poor PO  ASSESSMENT:   81 y.o. female with metastatic adenocarcinoma of the lung, recent PET scan showing hypermetabolic lesions in chest abdomen pelvis (adrenal glands, pancreas, pelvic lymph nodes, anterior abdominal wall, paraspinal soft tissue in the thoracic area) with a dominant right upper lobe mass who received her first cycle of chemotherapy on 9/2 and has been receiving palliative radiation to the paraspinal thoracic metastases in the right upper lobe lesion.  Pt in room with no family at bedside. Pt speaks only spanish. RD called and left a voicemail for pt's daughter to provide further diet history. Pt's daughter spoke with another RD at the [facility] and reported that the pt tries to drink 1-2 Ensures a day and her daughter would add peanut butter or almond milk to her supplements. Pt's UBW is around 110 lb (which she last weighed in 2013 per records).  Pt receiving Boost Breeze, will add Ensure once diet is advanced past clears. Per MD, expected discharge within 24 hours.  Labs reviewed: Low Na Mg WNL  Diet Order:  Diet clear liquid Room service appropriate?: Yes; Fluid consistency:: Thin  Skin:  Reviewed, no issues  Last BM:  9/14  Height:   Ht Readings from Last 1 Encounters:  06/07/15 '4\' 11"'$  (1.499 m)    Weight:   Wt Readings from  Last 1 Encounters:  06/07/15 101 lb (45.813 kg)    Ideal Body Weight:  44.7 kg  BMI:  Body mass index is 20.39 kg/(m^2).  Estimated Nutritional Needs:   Kcal:  1200-1400  Protein:  55-65g  Fluid:  1.4L/day  EDUCATION NEEDS:   No education needs identified at this time  Clayton Bibles, MS, RD, LDN Pager: [phone] After Hours Pager: [phone]

## 2015-06-08 NOTE — Progress Notes (Signed)
[facility]   HEMATOLOGY/ONCOLOGY INPATIENT PROGRESS NOTE  Date of Service: 06/08/2015  Inpatient Attending: .Kelvin Cellar, MD   SUBJECTIVE  Ashley Green was seen this afternoon. She is awake, alert and talkative. Was praying a lot today. Got her radiation therapy as planned today. Got 1 unit of PRBC and shall be getting 1 more. Sodium levels much improved. No other acute new symptoms.  OBJECTIVE:  PHYSICAL EXAMINATION: . Filed Vitals:   06/08/15 0706 06/08/15 1120 06/08/15 1152 06/08/15 1412  BP: 118/52 101/41 111/47 127/55  Pulse: 93 89 89 93  Temp: 98.2 F (36.8 C) 98.5 F (36.9 C) 98.2 F (36.8 C) 98.4 F (36.9 C)  TempSrc: Oral Oral Oral Oral  Resp: '20 20 20 20  ' Height:      Weight:      SpO2: 96% 96% 96% 98%   Filed Weights   06/07/15 1420  Weight: 101 lb (45.813 kg)   .Body mass index is 20.39 kg/(m^2).  GENERAL:alert, appears a little more relaxed no overt pain. SKIN: Palpable left upper back mass is firm with some tenderness to palpation no redness. One dominant and a few small right abdominal wall nodules. EYES: normal, conjunctiva mild pallor, sclera clear anicteric  OROPHARYNX:no exudate, no erythema and lips, buccal mucosa, and tongue normal  NECK: supple, thyroid normal size, non-tender, without nodularity LYMPH: no palpable lymphadenopathy in cervical, axillary or inguinal LUNGS: Few rt mid/upper zone rales, no rhonci HEART: regular rate & rhythm and no murmurs  ABDOMEN:abdomen less distended, stools present in her colostomy bag. Musculoskeletal b/l lower extremity 2+ pitting pedal edema PSYCH: alert  with fluent speech and directable. NEURO: awake and alert, answering questions appropriately, no focal motor/sensory deficits  MEDICAL HISTORY:  Past Medical History  Diagnosis Date  . Hypertension   . Colon cancer   . Bowel obstruction   . GERD (gastroesophageal reflux disease) 10/15/2012  . Adenocarcinoma, lung     SURGICAL HISTORY: Past Surgical  History  Procedure Laterality Date  . Colon surgery    . Colectomy  s/p colon cancer 2000    . Colostomy      SOCIAL HISTORY: Social History   Social History  . Marital Status: Single    Spouse Name: N/A  . Number of Children: 3  . Years of Education: N/A   Occupational History  . retired    Social History Main Topics  . Smoking status: Never Smoker   . Smokeless tobacco: Never Used  . Alcohol Use: No  . Drug Use: No  . Sexual Activity: No   Other Topics Concern  . Not on file   Social History Narrative   Works at [employer].     FAMILY HISTORY: Family History  Problem Relation Age of Onset  . Hodgkin's lymphoma Son   . Heart attack Mother   . Brain cancer Sister   . Pneumonia Father   . Alcoholism Brother   . Pneumonia Brother     ALLERGIES:  has No Known Allergies.  MEDICATIONS:  Scheduled Meds: . enoxaparin (LOVENOX) injection  40 mg Subcutaneous Q24H  . feeding supplement  1 Container Oral TID BM  . lip balm      . OxyCODONE  10 mg Oral Q12H  . pantoprazole  40 mg Oral Daily  . polyethylene glycol  17 g Oral Daily  . senna-docusate  2 tablet Oral BID  . sodium chloride  1 g Oral TID WC   Continuous Infusions:   PRN Meds:.acetaminophen **OR** acetaminophen,  alum & mag hydroxide-simeth, ondansetron **OR** ondansetron (ZOFRAN) IV  REVIEW OF SYSTEMS:    10 Point review of Systems was done is negative except as noted above.   LABORATORY DATA:  I have reviewed the data as listed  . CBC Latest Ref Rng 06/08/2015 06/07/2015 06/06/2015  WBC 4.0 - 10.5 K/uL 8.7 10.9(H) 12.0(H)  Hemoglobin 12.0 - 15.0 g/dL 7.3(L) 8.0(L) 8.5(L)  Hematocrit 36.0 - 46.0 % 21.6(L) 23.2(L) 24.7(L)  Platelets 150 - 400 K/uL 267 288 334    . CMP Latest Ref Rng 06/08/2015 06/07/2015 06/07/2015  Glucose 65 - 99 mg/dL 84 92 105(H)  BUN 6 - 20 mg/dL '11 11 11  ' Creatinine 0.44 - 1.00 mg/dL 0.45 0.44 0.50  Sodium 135 - 145 mmol/L 131(L) 129(L) 128(L)  Potassium 3.5 -  5.1 mmol/L 3.8 3.9 4.1  Chloride 101 - 111 mmol/L 98(L) 96(L) 96(L)  CO2 22 - 32 mmol/L '26 25 22  ' Calcium 8.9 - 10.3 mg/dL 7.8(L) 7.8(L) 8.0(L)  Total Protein 6.5 - 8.1 g/dL 5.0(L) - -  Total Bilirubin 0.3 - 1.2 mg/dL 0.7 - -  Alkaline Phos 38 - 126 U/L 62 - -  AST 15 - 41 U/L 21 - -  ALT 14 - 54 U/L 17 - -     RADIOGRAPHIC STUDIES: I have personally reviewed the radiological images as listed and agreed with the findings in the report. Ct Head Wo Contrast  06/06/2015   CLINICAL DATA:  Confusion starting yesterday. Metastatic adenocarcinoma of the lung.  EXAM: CT HEAD WITHOUT CONTRAST  TECHNIQUE: Contiguous axial images were obtained from the base of the skull through the vertex without intravenous contrast.  COMPARISON:  Brain MR off 03/22/2011  FINDINGS: Sinuses/Soft tissues: Hypoplastic right frontal sinus. Other paranasal sinuses and mastoid air cells are clear.  Intracranial: Expected cerebral and cerebellar volume loss for age. Relatively mild for age low density in the periventricular white matter likely related to small vessel disease. No mass lesion, hemorrhage, hydrocephalus, acute infarct, intra-axial, or extra-axial fluid collection.  IMPRESSION: 1.  No acute intracranial abnormality. 2. Mild cerebral atrophy and small vessel ischemic change.   Electronically Signed   By: Abigail Miyamoto M.D.   On: 06/06/2015 19:28   Mr Jeri Cos QP Contrast  05/10/2015   CLINICAL DATA:  Patient fell 1 month ago. Unsure if she struck head. History of colon cancer. Staging.  EXAM: MRI HEAD WITHOUT CONTRAST  TECHNIQUE: Multiplanar, multiecho pulse sequences of the brain and surrounding structures were obtained without intravenous contrast.  COMPARISON:  MR head 03/22/2011.  FINDINGS: Patient was unable to tolerate standard MRI brain imaging with and without contrast. No contrast could be administered. Only a few sequences were obtained. Some of these are motion degraded.  Sagittal T1 weighted images demonstrate  no midline abnormality. There is mild cervical spondylosis without osseous destruction.  On diffusion-weighted imaging, there is a small 3 mm focus of restricted diffusion, in the LEFT frontal subcortical white matter as seen on image 35 series 4, which could represent ischemia or small metastasis. No definite surrounding vasogenic edema on T2 imaging. No other definite areas of restricted diffusion are observed.  Generalized atrophy. Moderately advanced T2 and FLAIR hyperintensities representing small vessel disease. T2 and FLAIR axial images are insufficiently detailed, with too much motion, to allow detailed correlation with the LEFT frontal diffusion abnormality.  IMPRESSION: Suboptimal exam secondary to patient motion, and inability to tolerate the routine sequences required for pre and postcontrast imaging. No post infusion  imaging was obtained.  3 mm focus of restricted diffusion LEFT frontal subcortical white matter could represent a small acute infarct or mass. See discussion above.  Generalized atrophy with small vessel disease.  Consider repeat imaging with sedation or pain medicine to allow complete assessment of the brain with and without contrast.  These results will be called to the ordering clinician or representative by the Radiologist Assistant, and communication documented in the PACS or zVision Dashboard.   Electronically Signed   By: Staci Righter M.D.   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  Request made for repeat biopsy of an additional hypermetabolic subcutaneous nodule within the subcutaneous tissues of the right upper abdominal quadrant for EGFR mutation testing, alk gene rearrangement and Ros 1 testing.  EXAM: ULTRASOUND GUIDED BIOPSY OF  HYPERMETABOLIC NODULE WITHIN THE SUBCUTANEOUS TISSUES WITHIN THE RIGHT UPPER ABDOMEN  COMPARISON:  PET-CT - 05/04/2015; ultrasound-guided biopsy of dominant hypermetabolic mass with the subcutaneous tissues of the back - 05/08/2015  MEDICATIONS: None  ANESTHESIA/SEDATION: Conscious sedation was achieved with intravenous Versed and Fentanyl.  Total Moderate Sedation time  15 minutes  COMPLICATIONS: None immediate  PROCEDURE: Informed written consent was obtained from the patient and the patient's daughter via the use of a medical translator after a discussion of the risks, benefits and alternatives to treatment. The patient understands and consents the procedure. A timeout was performed prior to the initiation of the procedure.  Ultrasound scanning was performed of the right upper abdominal quadrant demonstrates a well-defined punctate approximately 1.3 x 1.2 cm hypoechoic nodule within the subcutaneous tissues of the right anterior lateral abdomen compatible with the hypermetabolic nodule seen on preceding PET-CT.  The right upper abdominal quadrant was prepped and draped in the usual sterile fashion. The overlying soft tissues were anesthetized with 1% lidocaine with epinephrine. This was followed by 6 core biopsies with an 18 gauge core device under direct ultrasound guidance. Multiple ultrasound images were saved for procedural documentation purposes.  Hemostasis was achieved with manual compression. Post procedural scanning was negative for definitive area of hemorrhage or additional complication. A dressing was placed. The patient tolerated the procedure well without immediate post procedural complication.  IMPRESSION: Technically successful ultrasound guided core needle biopsy of hypermetabolic nodule within the subcutaneous tissues of the right anterior lateral abdomen.   Electronically Signed   By: Sandi Mariscal M.D.   On: 05/23/2015 15:04   Dg Abd Acute W/chest  06/06/2015   CLINICAL DATA:  Nausea,  vomiting, shortness of breath and weakness. History of colon cancer and lung cancer. 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 Initial encounter.  EXAM: DG ABDOMEN ACUTE W/ 1V CHEST  COMPARISON:  PET CT 05/04/2015. Acute abdominal series done 10/23/2009.  FINDINGS: Large right upper lobe mass is grossly stable from recent PET-CT. The heart size and mediastinal contours are stable. The lungs are otherwise clear.  The bowel gas pattern is nonobstructive. There is no free intraperitoneal air. A moderate amount of stool is present throughout the colon. Multiple pelvic surgical clips are present. There are degenerative changes throughout the thoracolumbar spine.  IMPRESSION: 1. Prominent stool throughout the colon suggesting constipation. 2. Stable right upper lobe lung mass. 3. No acute findings identified.   Electronically Signed   By: Richardean Sale  M.D.   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  CEA level within normal limits. CA-19-9 level somewhat elevated likely due to metastases to the pancreas and less consistent with primary pancreatic metastatic cancer. LDH level within normal limits. Good ECOG performance status is 1 #2 Painful metastases to the spinal muscles over upper back. #3 Shortness of breath due to her lung mass causing some airway compression. #4 Symptomatic anemia due to her metastatic cancer. #5 Pain due to her cancer #6 Microcytic anemia due to anemia of chronic disease ferritin level 171.  #7 Bilateral  adrenal metastases with high risk for adrenal insufficiency. #8 dysphagia -no intraluminal masses on esophagogram . This symptom is likely from extrinsic compression from right upper lobe lung mass. #9 history of colorectal cancer diagnosed in 2000 treated with surgery followed by chemotherapy and radiation for her daughter. She had recurrence in 2001 when she was treated with further surgery and intraoperative radiation.  #10 Delirium - Likely related to hyponatremia as a function of dehydration/solute intake and some element of SIADH. No evidence of infection.  Patient was noted to be constipated based on the abdominal x-ray and is receiving laxatives to address this. Cannot rule out neuro-excitatory toxicity from opiates (morphine is typically the culprit due to greater chance of accumulation of metabolities) CT head with no evidence of brain mets #11 bilateral lower extremity swelling likely related to hypoalbuminemia from her cancer.  Plan -encourage po intake. -may discontinue oral sodium choloride tabs. -Optimize oral fluid intake as per dietary input. -Physical therapy and ambulation as much as possible. -re-orienting activities. -would avoid benzodiazepines. - continue oral OxyContin and prn dilaudid for pain management -continue senna-s for bowel prophylaxis and constipation. --Might consider low-dose Haldol for delirium 1-75m q6h prn for hyperactive delirium. Would not exceed a dose of 2 mg. -Her dexamethasone was recently discontinued. -If hypotensive or hypoglycemic might need to consider restarting low-dose steroids. -Continue daily palliative radiation therapy if possible to avoid break in treatment -Discussed with hospitalist plan for discharge tomorrow AM. Will get outpatient Nivolumab and RT and then will go to TCU for rehabilitation.  I spent 25 minutes counseling the patient face to face. The total time spent in the appointment was 35 minutes and more than 50% was on  counseling and direct patient cares.    GSullivan LoneMD MSan CarlosAAHIVMS SSumma Health System Barberton HospitalCMethodist Physicians ClinicHematology/Oncology Physician CTampa Bay Surgery Center Ltd (Office):       [phone](Work cell):  [phone](Fax):           [phone] 06/08/2015 3:49 PM

## 2015-06-08 NOTE — Progress Notes (Signed)
[facility] PROGRESS NOTE  Ashley Green WNU:[id number] DOB: 11/14/1927 DOA: 06/06/2015 PCP: [name], DO  Assessment/Plan: 1. Acute encephalopathy/delirium -Patient presenting with acute onset, mental status changes, confusion, inattention retrospect may be related to hyponatremia. Family reporting that she has had minimal by mouth intake over the past several weeks making hypovolemia possibility. -She continues to show clinical improvement, she ambulated down the hallway today with nursing staff, has been tolerating by mouth intake. He spoke to her daughter over telephone who confirms this can improvement. She states that at baseline her mother has some cognitive impairment -Will continue to provide supportive care, encourage mobilization and oral intake.  2.  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  Hypoalbuminemia. -Has history of metastatic lung cancer which is likely contributing -Provide protein boost as tolerated  4.  Hyponatremia.  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 -Her sodium levels have improved from 123 on admission to 131 on today's lab work. -Will stop IV fluids, encourage oral intake.  5.  Acute anemia. -Her hemoglobin has dropped from 8.0-7.3 on this mornings lab work. Clinically she does not appear to have evidence of GI bleed. I wonder if this could be dilutional from IV fluids she received -Will type and cross and transfuse with packed red blood cells today. He has undergone previous blood transfusions.  Code Status: DNR Family Communication:  Disposition Plan: Continue  supportive care,    Consultants:  PT    HPI/Subjective: Patient is an 81 year old female with history of metastatic adenocarcinoma of lung status post chemotherapy on 05/26/2015 and palliative radiation therapy to paraspinal thoracic metastasis, admitted to the medicine service on 06/06/2015. Patient presented with acute encephalopathy. Lab work revealed a sodium of 123. Family had reported decreased by mouth intake over the last several weeks. Hyponatremia felt to be secondary to SIADH versus hypovolemia given history of decreased by mouth intake. She was started on normal saline running at 75 mL's per hour.  Objective: Filed Vitals:   06/08/15 1152  BP: 111/47  Pulse: 89  Temp: 98.2 F (36.8 C)  Resp: 20    Intake/Output Summary (Last 24 hours) at 06/08/15 1410 Last data filed at 06/07/15 1930  Gross per 24 hour  Intake    600 ml  Output    500 ml  Net    100 ml   Filed Weights   06/07/15 1420  Weight: 45.813 kg (101 lb)    Exam:   General:  Patient appears better, more awake and alert, following commands remains little confused still, ambulating  Cardiovascular: Regular rate and rhythm normal S1-S2 no murmurs or gallops  Respiratory: Normal respiratory effort, lungs are clear  Abdomen: Soft nontender nondistended  Musculoskeletal: Sarcopenic, cachectic  Data Reviewed: Basic Metabolic Panel:  Recent Labs Lab 06/06/15 1130  06/07/15 0220 06/07/15 0649 06/07/15 1012 06/07/15 1423 06/08/15 0350  NA 123*  < > 124* 126* 128* 129* 131*  K 4.7  < > 4.2 4.3 4.1 3.9 3.8  CL  --   < > 92* 95* 96* 96* 98*  CO2 23  < > '22 22 22 25 26  '$ GLUCOSE 125  < > 99 88 105* 92 84  BUN  13.6  < > '12 12 11 11 11  '$ CREATININE 0.6  < > 0.41* 0.41* 0.50 0.44 0.45  CALCIUM 8.7  < > 7.8* 7.8* 8.0* 7.8* 7.8*  MG 1.7  --   --   --   --   --  2.0  < > = values in this interval not displayed. Liver Function Tests:  Recent Labs Lab 06/06/15 1130 06/08/15 0350  AST 33 21  ALT 28  17  ALKPHOS 104 62  BILITOT 0.44 0.7  PROT 6.1* 5.0*  ALBUMIN 2.1* 2.1*    Recent Labs Lab 06/07/15 0220  LIPASE 27  AMYLASE 37   No results for input(s): AMMONIA in the last 168 hours. CBC:  Recent Labs Lab 06/06/15 1129 06/06/15 1425 06/07/15 0220 06/08/15 0350  WBC 11.3* 12.0* 10.9* 8.7  NEUTROABS 9.9*  --   --   --   HGB 8.7* 8.5* 8.0* 7.3*  HCT 26.2* 24.7* 23.2* 21.6*  MCV 72.2* 71.0* 71.2* 71.1*  PLT 422* 334 288 267   Cardiac Enzymes: No results for input(s): CKTOTAL, CKMB, CKMBINDEX, TROPONINI in the last 168 hours. BNP (last 3 results)  Recent Labs  06/06/15 1425  BNP 81.6    ProBNP (last 3 results) No results for input(s): PROBNP in the last 8760 hours.  CBG: No results for input(s): GLUCAP in the last 168 hours.  Recent Results (from the past 240 hour(s))  Urine Culture     Status: None   Collection Time: 06/06/15 11:30 AM  Result Value Ref Range Status   Urine Culture, Routine Culture, Urine  Final    Comment: Final - ===== COLONY COUNT: ===== 75,000 COLONIES/ML ENTEROCOCCUS SPECIES  ------------------------------------------------------------------------  ENTEROCOCCUS SPECIES     AMPICILLIN                       MIC      Sensitive        <=2 ug/ml    LEVOFLOXACIN                     MIC      Sensitive          1 ug/ml    NITROFURANTOIN                   MIC      Sensitive       <=16 ug/ml    VANCOMYCIN                       MIC      Sensitive          1 ug/ml    TETRACYCLINE                     MIC      Resistant       >=16 ug/ml END OF REPORT      Studies: Ct Head Wo Contrast  06/06/2015   CLINICAL DATA:  Confusion starting yesterday. Metastatic adenocarcinoma of the lung.  EXAM: CT HEAD WITHOUT CONTRAST  TECHNIQUE: Contiguous axial images were obtained from the base of the skull through the vertex without intravenous contrast.  COMPARISON:  Brain MR off 03/22/2011  FINDINGS: Sinuses/Soft tissues: Hypoplastic right frontal sinus. Other  paranasal sinuses and mastoid air cells are clear.  Intracranial: Expected cerebral and cerebellar volume loss for age. Relatively mild for age low density in the periventricular white matter likely related  to small vessel disease. No mass lesion, hemorrhage, hydrocephalus, acute infarct, intra-axial, or extra-axial fluid collection.  IMPRESSION: 1.  No acute intracranial abnormality. 2. Mild cerebral atrophy and small vessel ischemic change.   Electronically Signed   By: Abigail Miyamoto M.D.   On: 06/06/2015 19:28   Dg Abd Acute W/chest  06/06/2015   CLINICAL DATA:  Nausea, vomiting, shortness of breath and weakness. History of colon cancer and lung cancer. 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 Initial encounter.  EXAM: DG ABDOMEN ACUTE W/ 1V CHEST  COMPARISON:  PET CT 05/04/2015. Acute abdominal series done 10/23/2009.  FINDINGS: Large right upper lobe mass is grossly stable from recent PET-CT. The heart size and mediastinal contours are stable. The lungs are otherwise clear.  The bowel gas pattern is nonobstructive. There is no free intraperitoneal air. A moderate amount of stool is present throughout the colon. Multiple pelvic surgical clips are present. There are degenerative changes throughout the thoracolumbar spine.  IMPRESSION: 1. Prominent stool throughout the colon suggesting constipation. 2. Stable right upper lobe lung mass. 3. No acute findings identified.   Electronically Signed   By: Richardean Sale M.D.   On: 06/06/2015 16:17    Scheduled Meds: . enoxaparin (LOVENOX) injection  40 mg Subcutaneous Q24H  . feeding supplement  1 Container Oral TID BM  . OxyCODONE  10 mg Oral Q12H  . pantoprazole  40 mg Oral Daily  . polyethylene glycol  17 g Oral Daily  . senna-docusate  2 tablet Oral BID  . sodium chloride  1 g Oral TID WC   Continuous Infusions:    Principal Problem:   Hyponatremia Active Problems:   Back  pain   Adenocarcinoma, lung   Acute delirium   Anemia of chronic disease   Metastasis to adrenal gland   Acute encephalopathy   Leg swelling    Time spent: 35 min    Kelvin Cellar  [facility] Pager [phone]. If 7PM-7AM, please contact night-coverage at [email or web address], password [facility] 06/08/2015, 2:10 PM  LOS: 2 days

## 2015-06-09 ENCOUNTER — Other Ambulatory Visit: Payer: Medicaid Other

## 2015-06-09 ENCOUNTER — Ambulatory Visit
Admission: RE | Admit: 2015-06-09 | Discharge: 2015-06-09 | Disposition: A | Discharge status: Home / Self Care | Payer: Medicaid Other | Source: Ambulatory Visit | Attending: Radiation Oncology | Admitting: Radiation Oncology

## 2015-06-09 ENCOUNTER — Ambulatory Visit: Payer: Medicaid Other

## 2015-06-09 DIAGNOSIS — C7970 Secondary malignant neoplasm of unspecified adrenal gland: Secondary | ICD-10-CM

## 2015-06-09 DIAGNOSIS — Z510 Encounter for antineoplastic radiation therapy: Secondary | ICD-10-CM | POA: Diagnosis not present

## 2015-06-09 LAB — CBC
HEMATOCRIT: 28.9 % — AB (ref 36.0–46.0)
HEMOGLOBIN: 9.8 g/dL — AB (ref 12.0–15.0)
MCH: 24.6 pg — AB (ref 26.0–34.0)
MCHC: 33.9 g/dL (ref 30.0–36.0)
MCV: 72.6 fL — ABNORMAL LOW (ref 78.0–100.0)
Platelets: 278 10*3/uL (ref 150–400)
RBC: 3.98 MIL/uL (ref 3.87–5.11)
RDW: 19.1 % — ABNORMAL HIGH (ref 11.5–15.5)
WBC: 9.1 10*3/uL (ref 4.0–10.5)

## 2015-06-09 LAB — TYPE AND SCREEN
ABO/RH(D): O POS
ANTIBODY SCREEN: NEGATIVE
Unit division: 0

## 2015-06-09 LAB — BASIC METABOLIC PANEL
Anion gap: 8 (ref 5–15)
BUN: 15 mg/dL (ref 6–20)
CHLORIDE: 100 mmol/L — AB (ref 101–111)
CO2: 24 mmol/L (ref 22–32)
CREATININE: 0.46 mg/dL (ref 0.44–1.00)
Calcium: 8 mg/dL — ABNORMAL LOW (ref 8.9–10.3)
GFR calc Af Amer: >60 mL/min (ref >60)
GFR calc non Af Amer: >60 mL/min (ref >60)
GLUCOSE: 108 mg/dL — AB (ref 65–99)
Potassium: 4.1 mmol/L (ref 3.5–5.1)
Sodium: 132 mmol/L — ABNORMAL LOW (ref 135–145)

## 2015-06-09 MED ORDER — POLYETHYLENE GLYCOL 3350 17 G PO PACK: 17.0000 g | PACK | Freq: Every day | ORAL | Status: AC

## 2015-06-09 MED ORDER — ONDANSETRON 4 MG PO TBDP
8.0000 mg | ORAL_TABLET | Freq: Once | ORAL | Status: AC
  Administered 2015-06-09: 8 mg via ORAL
  Filled 2015-06-09: qty 2

## 2015-06-09 MED ORDER — OXYCODONE HCL ER 10 MG PO T12A: 10.0000 mg | EXTENDED_RELEASE_TABLET | Freq: Two times a day (BID) | ORAL | Status: DC

## 2015-06-09 MED ORDER — BOOST / RESOURCE BREEZE PO LIQD: 1.0000 | Freq: Three times a day (TID) | ORAL | Status: AC

## 2015-06-09 MED ORDER — MORPHINE SULFATE (CONCENTRATE) 20 MG/ML PO SOLN: 5.0000 mg | ORAL | Status: DC | PRN

## 2015-06-09 MED ORDER — SENNOSIDES-DOCUSATE SODIUM 8.6-50 MG PO TABS: 2.0000 | ORAL_TABLET | Freq: Two times a day (BID) | ORAL | Status: AC

## 2015-06-09 NOTE — Clinical Social Work Placement (Signed)
   CLINICAL SOCIAL WORK PLACEMENT  NOTE  Date:  06/09/2015  Patient Details  Name: Ashley Green MRN: [id number] Date of Birth: 12-05-1927  Clinical Social Work is seeking post-discharge placement for this patient at the [place] level of care (*CSW will initial, date and re-position this form in  chart as items are completed):  Yes   Patient/family provided with [place] Work Department's list of facilities offering this level of care within the geographic area requested by the patient (or if unable, by the patient's family).  Yes   Patient/family informed of their freedom to choose among providers that offer the needed level of care, that participate in Medicare, Medicaid or managed care program needed by the patient, have an available bed and are willing to accept the patient.  Yes   Patient/family informed of [facility]'s ownership interest in [facility] and [facility], as well as of the fact that they are under no obligation to receive care at these facilities.  PASRR submitted to EDS on 06/08/15     PASRR number received on 06/08/15     Existing PASRR number confirmed on       FL2 transmitted to all facilities in geographic area requested by pt/family on 06/08/15     FL2 transmitted to all facilities within larger geographic area on       Patient informed that his/her managed care company has contracts with or will negotiate with certain facilities, including the following:        Yes   Patient/family informed of bed offers received.  Patient chooses bed at [facility] [facility]     Physician recommends and patient chooses bed at      Patient to be transferred to [facility] on 06/09/15.  Patient to be transferred to facility by ambulance [facility]y Harold)     Patient family notified on 06/09/15 of transfer.  Name of family member notified:  pt daughter, [name] notified via telephone      PHYSICIAN Please sign FL2, Please sign DNR     Additional Comment:    _______________________________________________ Ladell Pier, LCSW 06/09/2015, 5:17 PM

## 2015-06-09 NOTE — Clinical Social Work Note (Signed)
Clinical Social Work Assessment  Patient Details  Name: Ashley Green MRN: [id number] Date of Birth: 11-07-1927  Date of referral:  06/09/15               Reason for consult:  Discharge Planning                Permission sought to share information with:  Family Supports Permission granted to share information::  Yes, Verbal Permission Granted  Name::     Ashley Green  Agency::     Relationship::  daughter  Contact Information:  [phone]  Housing/Transportation Living arrangements for the past 2 months:  [place] of Information:  Patient, Adult Children Patient Interpreter Needed:  Spanish Criminal Activity/Legal Involvement Pertinent to Current Situation/Hospitalization:  No - Comment as needed Significant Relationships:  Adult Children Lives with:  Adult Children Do you feel safe going back to the place where you live?  No Need for family participation in patient care:  Yes (Comment)  Care giving concerns:  Pt admitted from home with pt daughter. Pt daughter feels that pt would benefit from short term rehab at SNF as pt does not have 24 hour care at home.    Social Worker assessment / plan:  CSW continuing to follow.  Pt daughter requesting SNF, but wanted CSW to speak with pt about option for SNF. Pt has bed offer from [facility] as Letter of Guarantee (LOG).   CSW met with pt along with MD who interpreted at bedside. CSW introduced self and explained role. CSW discussed with pt that PT recommending rehab at [facility] and pt daughter had chosen [facility]. Pt states that she is agreeable to go to SNF upon discharge.  Per MD, anticipate discharge today and pt will have to go straight over to cancer center for chemotherapy and radiation treatment and then go to [facility] from [facility]. Pt agreeable to plan.  CSW attempted to contact pt daughter via telephone and left voice message.  CSW to  continue to follow to assist with pt discharge plan.   Employment status:  Retired Forensic scientist:  Medicaid In [place] PT Recommendations:  [place] / Referral to community resources:  [place]  Patient/Family's Response to care:  Pt alert and oriented x 4. Pt expressed understanding about need for short term rehab before returning home with pt daughter.  Patient/Family's Understanding of and Emotional Response to Diagnosis, Current Treatment, and Prognosis:  Pt daughter recognizes that pt needs some assistance and that cannot be provided to pt given pt daughter works.   Emotional Assessment Appearance:  Appears stated age Attitude/Demeanor/Rapport:  Other (pt appropriate) Affect (typically observed):  Appropriate Orientation:  Oriented to Self, Oriented to Place, Oriented to  Time, Oriented to Situation Alcohol / Substance use:  Not Applicable Psych involvement (Current and /or in the community):  No (Comment)  Discharge Needs  Concerns to be addressed:  Discharge Planning Concerns Readmission within the last 30 days:  No Current discharge risk:  Other (pt does not have 24 hour supervision at home as recommended by PT) Barriers to Discharge:  No Barriers Identified   KIDD, [place], LCSW 06/09/2015, 11:22 AM  [phone]

## 2015-06-09 NOTE — Progress Notes (Signed)
Report called to [place], Therapist, sports at [employer].  Dtr aware of discharge.  PTAR to transport. Pt stable, nausea better after Zofran.

## 2015-06-09 NOTE — Discharge Summary (Signed)
Physician Discharge Summary  Ashley Green TKP:[id number] DOB: 03/25/1928 DOA: 06/06/2015  PCP: Ashley Panning, DO  Admit date: 06/06/2015 Discharge date: 06/09/2015  Time spent: 35 minutes  Recommendations for Outpatient Follow-up:  1. Please follow-up on BMP in 5-7 days, she presented with delirium in setting of hyponatremia. On the day of discharge she had a sodium of 132. 2. She was discharged to skilled nursing facility for acute rehabilitation  Discharge Diagnoses:  Principal Problem:   Hyponatremia Active Problems:   Back pain   Adenocarcinoma, lung   Acute delirium   Anemia of chronic disease   Metastasis to adrenal gland   Acute encephalopathy   Leg swelling   Discharge Condition: Stable  Diet recommendation: Regular diet  Filed Weights   06/07/15 1420  Weight: 45.813 kg (101 lb)    History of present illness:   Hospital Course:  Patient is an 81 year old female with history of metastatic adenocarcinoma of lung status post chemotherapy on 05/26/2015 and palliative radiation therapy to paraspinal thoracic metastasis, admitted to the medicine service on 06/06/2015. Patient presented with acute encephalopathy. Lab work revealed a sodium of 123. Family had reported decreased by mouth intake over the last several weeks. Hyponatremia felt to be secondary to SIADH versus hypovolemia given history of decreased by mouth intake. She was started on normal saline running at 75 mL's per hour. Ashley Green showed gradual clinical improvement during this hospitalization. By 06/09/2015 her sodium levels improved to 131.   Acute encephalopathy/delirium -Patient presenting with acute onset, mental status changes, confusion, inattention retrospect may be related to hyponatremia. Family reporting that she has had minimal by mouth intake over the past several weeks making hypovolemia possibility. -She showed clinical improvement during this hospitalization, she ambulated down the hallway today  with nursing staff, has been tolerating by mouth intake. He spoke to her daughter over telephone who confirms this can improvement. She states that at baseline her mother has some cognitive impairment  2. Adenocarcinoma of lung -Patient with history of metastatic adenocarcinoma with metastasis to adrenal glands, pancreas, pelvic lymph nodes, anterior abdominal wall, paraspinal soft tissue -She is currently receiving her care at the cancer center undergoing chemotherapy and radiation therapy -For pain management she is on oxycodone 10 mg every 12 hours and Roxanol for breakthrough pain  3. Hypoalbuminemia. -Has history of metastatic lung cancer which is likely contributing -Provide protein boost as tolerated  4. Hyponatremia.  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 -Her sodium levels have improved from 123 on admission to 132  5. Acute anemia. -Her hemoglobin has dropped from 8.0-7.3 on this mornings lab work. Clinically she does not appear to have evidence of GI bleed. I wonder if this could be dilutional from IV fluids she received -Her hemoglobin improved to 9.8 by the day of discharge  Procedures:  Status post transfusion with 2 units of packed red blood cells  Consultations:  Medical oncology  Discharge Exam: Filed Vitals:   06/09/15 1306  BP: 115/43  Pulse: 87  Temp: 97.4 F (36.3 C)  Resp: 18     General: Patient appears better, more awake and alert, following commands remains little confused still, ambulating  Cardiovascular: Regular rate and rhythm normal S1-S2 no murmurs or gallops  Respiratory: Normal respiratory effort, lungs are clear  Abdomen: Soft nontender nondistended  Musculoskeletal: Sarcopenic, cachectic  Discharge Instructions    Current Discharge Medication List    CONTINUE these medications which have NOT CHANGED   Details  cyanocobalamin 2000 MCG tablet Take  2,000 mcg by mouth 2 (two) times daily.    Docusate Calcium (STOOL SOFTENER PO) Take 1 tablet by mouth every evening.     magnesium citrate SOLN Take 0.5 Bottles by mouth daily as needed (constipation).     morphine (ROXANOL) 20 MG/ML concentrated solution Take 0.25-0.5 mLs (5-10 mg total) by mouth every 2 (two) hours as needed for severe pain or breakthrough pain. Qty: 200 mL, Refills: 0    omeprazole (PRILOSEC) 40 MG capsule Take 1 capsule (40 mg total) by mouth daily. Qty: 30 capsule, Refills: 3   Associated Diagnoses: GERD (gastroesophageal reflux disease)    ondansetron (ZOFRAN-ODT) 4 MG disintegrating tablet Take 1 tablet (4 mg total) by mouth every 8 (eight) hours as needed for nausea or vomiting. Qty: 20 tablet, Refills: 0    OxyCODONE (OXYCONTIN) 10 mg T12A 12 hr tablet Take 1 tablet (10 mg total) by mouth every 12 (twelve) hours. Qty: 60 tablet, Refills: 0    polyethylene glycol powder (MIRALAX) powder Take 17 g by mouth 2 (two) times daily. Qty: 255 g, Refills: 3   Associated Diagnoses: Other constipation    Ostomy Supplies (NATURA STOMAHESIVE MOLDABLE) WAFR 1 each by Does not apply route 2 (two) times daily. Qty: 60 Wafer, Refills: 12       No Known Allergies Follow-up Information    Follow up with [facility], DO In 2 weeks.   Specialty:  Family Medicine   Contact information:   [address] [place] [address] [phone]        The results of significant diagnostics from this hospitalization (including imaging, microbiology, ancillary and laboratory) are listed below for reference.    Significant Diagnostic Studies: Ct Head Wo Contrast  06/06/2015   CLINICAL DATA:  Confusion starting yesterday. Metastatic adenocarcinoma of the lung.  EXAM: CT HEAD WITHOUT CONTRAST  TECHNIQUE: Contiguous axial images were obtained from the base of the skull through the vertex without intravenous contrast.  COMPARISON:  Brain MR off 03/22/2011  FINDINGS:  Sinuses/Soft tissues: Hypoplastic right frontal sinus. Other paranasal sinuses and mastoid air cells are clear.  Intracranial: Expected cerebral and cerebellar volume loss for age. Relatively mild for age low density in the periventricular white matter likely related to small vessel disease. No mass lesion, hemorrhage, hydrocephalus, acute infarct, intra-axial, or extra-axial fluid collection.  IMPRESSION: 1.  No acute intracranial abnormality. 2. Mild cerebral atrophy and small vessel ischemic change.   Electronically Signed   By: Abigail Miyamoto M.D.   On: 06/06/2015 19:28   Mr Jeri Cos TM Contrast  05/10/2015   CLINICAL DATA:  Patient fell 1 month ago. Unsure if she struck head. History of colon cancer. Staging.  EXAM: MRI HEAD WITHOUT CONTRAST  TECHNIQUE: Multiplanar, multiecho pulse sequences of the brain and surrounding structures were obtained without intravenous contrast.  COMPARISON:  MR head 03/22/2011.  FINDINGS: Patient was unable to tolerate standard MRI brain imaging with and without contrast. No contrast could be administered. Only a few sequences were obtained. Some of these are motion degraded.  Sagittal T1 weighted images demonstrate no midline abnormality. There is mild cervical spondylosis without osseous destruction.  On diffusion-weighted imaging, there is a small 3 mm focus of restricted diffusion, in the LEFT frontal subcortical white matter as seen on image 35 series 4, which could represent ischemia or small metastasis. No definite surrounding vasogenic edema on T2 imaging. No other definite areas of restricted diffusion are observed.  Generalized  atrophy. Moderately advanced T2 and FLAIR hyperintensities representing small vessel disease. T2 and FLAIR axial images are insufficiently detailed, with too much motion, to allow detailed correlation with the LEFT frontal diffusion abnormality.  IMPRESSION: Suboptimal exam secondary to patient motion, and inability to tolerate the routine  sequences required for pre and postcontrast imaging. No post infusion imaging was obtained.  3 mm focus of restricted diffusion LEFT frontal subcortical white matter could represent a small acute infarct or mass. See discussion above.  Generalized atrophy with small vessel disease.  Consider repeat imaging with sedation or pain medicine to allow complete assessment of the brain with and without contrast.  These results will be called to the ordering clinician or representative by the Radiologist Assistant, and communication documented in the PACS or zVision Dashboard.   Electronically Signed   By: Staci Righter M.D.   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  Request made for repeat biopsy of an additional hypermetabolic subcutaneous nodule within the subcutaneous tissues of the right upper abdominal quadrant for EGFR mutation testing, alk gene rearrangement and Ros 1 testing.  EXAM: ULTRASOUND GUIDED BIOPSY OF HYPERMETABOLIC NODULE WITHIN THE SUBCUTANEOUS TISSUES WITHIN THE RIGHT UPPER ABDOMEN  COMPARISON:  PET-CT - 05/04/2015; ultrasound-guided biopsy of dominant hypermetabolic mass with the subcutaneous tissues of the back - 05/08/2015  MEDICATIONS: None  ANESTHESIA/SEDATION: Conscious sedation was achieved with intravenous Versed and Fentanyl.  Total Moderate Sedation time  15 minutes  COMPLICATIONS: None immediate  PROCEDURE: Informed written consent was obtained from the patient and the patient's daughter via the use of a medical translator after a discussion of the risks, benefits and alternatives to treatment. The patient understands and consents the procedure. A timeout was performed prior to the initiation of the procedure.  Ultrasound scanning was performed of the right  upper abdominal quadrant demonstrates a well-defined punctate approximately 1.3 x 1.2 cm hypoechoic nodule within the subcutaneous tissues of the right anterior lateral abdomen compatible with the hypermetabolic nodule seen on preceding PET-CT.  The right upper abdominal quadrant was prepped and draped in the usual sterile fashion. The overlying soft tissues were anesthetized with 1% lidocaine with epinephrine. This was followed by 6 core biopsies with an 18 gauge core device under direct ultrasound guidance. Multiple ultrasound images were saved for procedural documentation purposes.  Hemostasis was achieved with manual compression. Post procedural scanning was negative for definitive area of hemorrhage or additional complication. A dressing was placed. The patient tolerated the procedure well without immediate post procedural complication.  IMPRESSION: Technically successful ultrasound guided core needle biopsy of hypermetabolic nodule within the subcutaneous tissues of the right anterior lateral abdomen.   Electronically Signed   By: Sandi Mariscal M.D.   On: 05/23/2015 15:04   Dg Abd Acute W/chest  06/06/2015   CLINICAL DATA:  Nausea, vomiting, shortness of breath and weakness. History of colon cancer and lung cancer. 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 Initial encounter.  EXAM: DG ABDOMEN ACUTE W/ 1V CHEST  COMPARISON:  PET CT 05/04/2015. Acute abdominal series done 10/23/2009.  FINDINGS: Large right upper lobe mass is grossly stable from recent PET-CT. The heart size and mediastinal contours are stable. The lungs are otherwise clear.  The bowel gas pattern is nonobstructive. There is no free intraperitoneal  air. A moderate amount of stool is present throughout the colon. Multiple pelvic surgical clips are present. There are degenerative changes throughout the thoracolumbar spine.  IMPRESSION: 1. Prominent stool throughout the colon  suggesting constipation. 2. Stable right upper lobe lung mass. 3. No acute findings identified.   Electronically Signed   By: Richardean Sale M.D.   On: 06/06/2015 16:17    Microbiology: Recent Results (from the past 240 hour(s))  Urine Culture     Status: None   Collection Time: 06/06/15 11:30 AM  Result Value Ref Range Status   Urine Culture, Routine Culture, Urine  Final    Comment: Final - ===== COLONY COUNT: ===== 75,000 COLONIES/ML ENTEROCOCCUS SPECIES  ------------------------------------------------------------------------  ENTEROCOCCUS SPECIES     AMPICILLIN                       MIC      Sensitive        <=2 ug/ml    LEVOFLOXACIN                     MIC      Sensitive          1 ug/ml    NITROFURANTOIN                   MIC      Sensitive       <=16 ug/ml    VANCOMYCIN                       MIC      Sensitive          1 ug/ml    TETRACYCLINE                     MIC      Resistant       >=16 ug/ml END OF REPORT      Labs: Basic Metabolic Panel:  Recent Labs Lab 06/06/15 1130  06/07/15 0649 06/07/15 1012 06/07/15 1423 06/08/15 0350 06/09/15 0405  NA 123*  < > 126* 128* 129* 131* 132*  K 4.7  < > 4.3 4.1 3.9 3.8 4.1  CL  --   < > 95* 96* 96* 98* 100*  CO2 23  < > _0 GLUCOSE 125  < > 88 105* 92 84 108*  BUN 13.6  < > _1 CREATININE 0.6  < > 0.41* 0.50 0.44 0.45 0.46  CALCIUM 8.7  < > 7.8* 8.0* 7.8* 7.8* 8.0*  MG 1.7  --   --   --   --  2.0  --   < > = values in this interval not displayed. Liver Function Tests:  Recent Labs Lab 06/06/15 1130 06/08/15 0350  AST 33 21  ALT 28 17  ALKPHOS 104 62  BILITOT 0.44 0.7  PROT 6.1* 5.0*  ALBUMIN 2.1* 2.1*    Recent Labs Lab 06/07/15 0220  LIPASE 27  AMYLASE 37   No results for input(s): AMMONIA in the last 168 hours. CBC:  Recent Labs Lab 06/06/15 1129 06/06/15 1425 06/07/15 0220 06/08/15 0350 06/09/15 0405  WBC 11.3* 12.0* 10.9* 8.7 9.1  NEUTROABS 9.9*  --   --   --    --   HGB 8.7* 8.5* 8.0* 7.3* 9.8*  HCT 26.2* 24.7* 23.2* 21.6* 28.9*  MCV 72.2* 71.0* 71.2* 71.1* 72.6*  PLT  422* 334 288 267 278   Cardiac Enzymes: No results for input(s): CKTOTAL, CKMB, CKMBINDEX, TROPONINI in the last 168 hours. BNP: BNP (last 3 results)  Recent Labs  06/06/15 1425  BNP 81.6    ProBNP (last 3 results) No results for input(s): PROBNP in the last 8760 hours.  CBG:  Recent Labs Lab 06/08/15 2209  GLUCAP 100*       Signed:  Kelvin Cellar  [facility] 06/09/2015, 2:39 PM

## 2015-06-09 NOTE — Clinical Social Work Placement (Signed)
   CLINICAL SOCIAL WORK PLACEMENT  NOTE  Date:  06/09/2015  Patient Details  Name: Ashley Green MRN: [id number] Date of Birth: 1927/11/12  Clinical Social Work is seeking post-discharge placement for this patient at the [place] level of care (*CSW will initial, date and re-position this form in  chart as items are completed):  Yes   Patient/family provided with [facility] Work Department's list of facilities offering this level of care within the geographic area requested by the patient (or if unable, by the patient's family).  Yes   Patient/family informed of their freedom to choose among providers that offer the needed level of care, that participate in Medicare, Medicaid or managed care program needed by the patient, have an available bed and are willing to accept the patient.  Yes   Patient/family informed of [facility]'s ownership interest in [facility] and [facility], as well as of the fact that they are under no obligation to receive care at these facilities.  PASRR submitted to EDS on 06/08/15     PASRR number received on 06/08/15     Existing PASRR number confirmed on       FL2 transmitted to all facilities in geographic area requested by pt/family on 06/08/15     FL2 transmitted to all facilities within larger geographic area on       Patient informed that his/her managed care company has contracts with or will negotiate with certain facilities, including the following:        Yes   Patient/family informed of bed offers received.  Patient chooses bed at [facility]     Physician recommends and patient chooses bed at      Patient to be transferred to   on  .  Patient to be transferred to facility by       Patient family notified on   of transfer.  Name of family member notified:        PHYSICIAN Please sign FL2, Please sign DNR     Additional Comment:     _______________________________________________ Ladell Pier, LCSW 06/09/2015, 11:27 AM

## 2015-06-09 NOTE — Progress Notes (Signed)
Pt for discharge to [facility].   CSW facilitated pt discharge needs including contacting facility, faxing pt discharge information via [facility], discussing with pt at bedside and pt daughter via telephone, providing RN phone number to call report, and arranging ambulance transport via [facility].   Letter of Guarantee (LOG) request sent and confirmed that LOG being provided to SNF.  No further social work needs identified at this time.  CSW signing off.   Alison Murray, MSW, [facility] [phone]

## 2015-06-09 NOTE — Progress Notes (Signed)
[facility]   HEMATOLOGY/ONCOLOGY INPATIENT PROGRESS NOTE  Date of Service: 06/09/2015  Inpatient Attending: .No att. providers found   SUBJECTIVE  Mrs. Ashley Green was seen this afternoon. She just returned from her RT session. Had significant nausea this AM but resolved with zofran. Gradually improving po intake. Being discharged to [place]. Appears to be back to baseline. Good bowel movements.  OBJECTIVE:  PHYSICAL EXAMINATION: . Filed Vitals:   06/08/15 2148 06/08/15 2302 06/09/15 0659 06/09/15 1306  BP:  105/50 103/97 115/43  Pulse: 96 95 83 87  Temp:  97.3 F (36.3 C) 98.2 F (36.8 C) 97.4 F (36.3 C)  TempSrc:  Oral Oral Oral  Resp:  _0 Height:      Weight:      SpO2:  96% 97% 100%   Filed Weights   06/07/15 1420  Weight: 101 lb (45.813 kg)   .Body mass index is 20.39 kg/(m^2).  GENERAL:alert, appears a little more relaxed no overt pain. SKIN: Palpable left upper back mass is firm with some tenderness to palpation no redness. One dominant and a few small right abdominal wall nodules. EYES: normal, conjunctiva mild pallor, sclera clear anicteric  OROPHARYNX:no exudate, no erythema and lips, buccal mucosa, and tongue normal  NECK: supple, thyroid normal size, non-tender, without nodularity LYMPH: no palpable lymphadenopathy in cervical, axillary or inguinal LUNGS: Few rt mid/upper zone rales, no rhonci HEART: regular rate & rhythm and no murmurs  ABDOMEN:abdomen less distended, stools present in her colostomy bag. Musculoskeletal b/l lower extremity 1+ pitting pedal edema (much improved from admission) PSYCH: alert  with fluent speech and directable. NEURO: awake and alert, answering questions appropriately, no focal motor/sensory deficits  MEDICAL HISTORY:  Past Medical History  Diagnosis Date  . Hypertension   . Colon cancer   . Bowel obstruction   . GERD (gastroesophageal reflux disease) 10/15/2012  . Adenocarcinoma, lung     SURGICAL HISTORY: Past Surgical  History  Procedure Laterality Date  . Colon surgery    . Colectomy  s/p colon cancer 2000    . Colostomy      SOCIAL HISTORY: Social History   Social History  . Marital Status: Single    Spouse Name: N/A  . Number of Children: 3  . Years of Education: N/A   Occupational History  . retired    Social History Main Topics  . Smoking status: Never Smoker   . Smokeless tobacco: Never Used  . Alcohol Use: No  . Drug Use: No  . Sexual Activity: No   Other Topics Concern  . Not on file   Social History Narrative   Works at [employer].     FAMILY HISTORY: Family History  Problem Relation Age of Onset  . Hodgkin's lymphoma Son   . Heart attack Mother   . Brain cancer Sister   . Pneumonia Father   . Alcoholism Brother   . Pneumonia Brother     ALLERGIES:  has No Known Allergies.  MEDICATIONS:  .No current facility-administered medications for this encounter.  Current outpatient prescriptions:  .  cyanocobalamin 2000 MCG tablet, Take 2,000 mcg by mouth 2 (two) times daily., Disp: , Rfl:  .  Docusate Calcium (STOOL SOFTENER PO), Take 1 tablet by mouth every evening. , Disp: , Rfl:  .  magnesium citrate SOLN, Take 0.5 Bottles by mouth daily as needed (constipation). , Disp: , Rfl:  .  omeprazole (PRILOSEC) 40 MG capsule, Take 1 capsule (40 mg total) by mouth daily.,  Disp: 30 capsule, Rfl: 3 .  ondansetron (ZOFRAN-ODT) 4 MG disintegrating tablet, Take 1 tablet (4 mg total) by mouth every 8 (eight) hours as needed for nausea or vomiting., Disp: 20 tablet, Rfl: 0 .  feeding supplement (BOOST / RESOURCE BREEZE) LIQD, Take 1 Container by mouth 3 (three) times daily between meals., Disp: 97 Container, Rfl: 0 .  morphine (ROXANOL) 20 MG/ML concentrated solution, Take 0.25 mLs (5 mg total) by mouth every 4 (four) hours as needed for severe pain or breakthrough pain., Disp: 200 mL, Rfl: 0 .  Ostomy Supplies (NATURA STOMAHESIVE MOLDABLE) WAFR, 1 each by Does not apply route  2 (two) times daily., Disp: 60 Wafer, Rfl: 12 .  OxyCODONE (OXYCONTIN) 10 mg T12A 12 hr tablet, Take 1 tablet (10 mg total) by mouth every 12 (twelve) hours., Disp: 60 tablet, Rfl: 0 .  polyethylene glycol (MIRALAX / GLYCOLAX) packet, Take 17 g by mouth daily., Disp: 14 each, Rfl: 0 .  senna-docusate (SENOKOT-S) 8.6-50 MG per tablet, Take 2 tablets by mouth 2 (two) times daily., Disp: 30 tablet, Rfl: 0   REVIEW OF SYSTEMS:    10 Point review of Systems was done is negative except as noted above.   LABORATORY DATA:  I have reviewed the data as listed  . CBC Latest Ref Rng 06/09/2015 06/08/2015 06/07/2015  WBC 4.0 - 10.5 K/uL 9.1 8.7 10.9(H)  Hemoglobin 12.0 - 15.0 g/dL 9.8(L) 7.3(L) 8.0(L)  Hematocrit 36.0 - 46.0 % 28.9(L) 21.6(L) 23.2(L)  Platelets 150 - 400 K/uL 278 267 288    . CMP Latest Ref Rng 06/09/2015 06/08/2015 06/07/2015  Glucose 65 - 99 mg/dL 108(H) 84 92  BUN 6 - 20 mg/dL _0 Creatinine 0.44 - 1.00 mg/dL 0.46 0.45 0.44  Sodium 135 - 145 mmol/L 132(L) 131(L) 129(L)  Potassium 3.5 - 5.1 mmol/L 4.1 3.8 3.9  Chloride 101 - 111 mmol/L 100(L) 98(L) 96(L)  CO2 22 - 32 mmol/L _1 Calcium 8.9 - 10.3 mg/dL 8.0(L) 7.8(L) 7.8(L)  Total Protein 6.5 - 8.1 g/dL - 5.0(L) -  Total Bilirubin 0.3 - 1.2 mg/dL - 0.7 -  Alkaline Phos 38 - 126 U/L - 62 -  AST 15 - 41 U/L - 21 -  ALT 14 - 54 U/L - 17 -     RADIOGRAPHIC STUDIES: I have personally reviewed the radiological images as listed and agreed with the findings in the report. Ct Head Wo Contrast  06/06/2015   CLINICAL DATA:  Confusion starting yesterday. Metastatic adenocarcinoma of the lung.  EXAM: CT HEAD WITHOUT CONTRAST  TECHNIQUE: Contiguous axial images were obtained from the base of the skull through the vertex without intravenous contrast.  COMPARISON:  Brain MR off 03/22/2011  FINDINGS: Sinuses/Soft tissues: Hypoplastic right frontal sinus. Other paranasal sinuses and mastoid air cells are clear.  Intracranial:  Expected cerebral and cerebellar volume loss for age. Relatively mild for age low density in the periventricular white matter likely related to small vessel disease. No mass lesion, hemorrhage, hydrocephalus, acute infarct, intra-axial, or extra-axial fluid collection.  IMPRESSION: 1.  No acute intracranial abnormality. 2. Mild cerebral atrophy and small vessel ischemic change.   Electronically Signed   By: Abigail Miyamoto M.D.   On: 06/06/2015 19:28   US Biopsy  05/23/2015   INDICATION: History of metastatic poorly differentiated lung carcinoma with sarcomatoid features, post ultrasound-guided biopsy of dominant hypermetabolic nodule within the subcutaneous tissues of the back performed on 05/08/2015.  Request made for  repeat biopsy of an additional hypermetabolic subcutaneous nodule within the subcutaneous tissues of the right upper abdominal quadrant for EGFR mutation testing, alk gene rearrangement and Ros 1 testing.  EXAM: ULTRASOUND GUIDED BIOPSY OF HYPERMETABOLIC NODULE WITHIN THE SUBCUTANEOUS TISSUES WITHIN THE RIGHT UPPER ABDOMEN  COMPARISON:  PET-CT - 05/04/2015; ultrasound-guided biopsy of dominant hypermetabolic mass with the subcutaneous tissues of the back - 05/08/2015  MEDICATIONS: None  ANESTHESIA/SEDATION: Conscious sedation was achieved with intravenous Versed and Fentanyl.  Total Moderate Sedation time  15 minutes  COMPLICATIONS: None immediate  PROCEDURE: Informed written consent was obtained from the patient and the patient's daughter via the use of a medical translator after a discussion of the risks, benefits and alternatives to treatment. The patient understands and consents the procedure. A timeout was performed prior to the initiation of the procedure.  Ultrasound scanning was performed of the right upper abdominal quadrant demonstrates a well-defined punctate approximately 1.3 x 1.2 cm hypoechoic nodule within the subcutaneous tissues of the right anterior lateral abdomen compatible with  the hypermetabolic nodule seen on preceding PET-CT.  The right upper abdominal quadrant was prepped and draped in the usual sterile fashion. The overlying soft tissues were anesthetized with 1% lidocaine with epinephrine. This was followed by 6 core biopsies with an 18 gauge core device under direct ultrasound guidance. Multiple ultrasound images were saved for procedural documentation purposes.  Hemostasis was achieved with manual compression. Post procedural scanning was negative for definitive area of hemorrhage or additional complication. A dressing was placed. The patient tolerated the procedure well without immediate post procedural complication.  IMPRESSION: Technically successful ultrasound guided core needle biopsy of hypermetabolic nodule within the subcutaneous tissues of the right anterior lateral abdomen.   Electronically Signed   By: Sandi Mariscal M.D.   On: 05/23/2015 15:04   Dg Abd Acute W/chest  06/06/2015   CLINICAL DATA:  Nausea, vomiting, shortness of breath and weakness. History of colon cancer and lung cancer. 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 Initial encounter.  EXAM: DG ABDOMEN ACUTE W/ 1V CHEST  COMPARISON:  PET CT 05/04/2015. Acute abdominal series done 10/23/2009.  FINDINGS: Large right upper lobe mass is grossly stable from recent PET-CT. The heart size and mediastinal contours are stable. The lungs are otherwise clear.  The bowel gas pattern is nonobstructive. There is no free intraperitoneal air. A moderate amount of stool is present throughout the colon. Multiple pelvic surgical clips are present. There are degenerative changes throughout the thoracolumbar spine.  IMPRESSION: 1. Prominent stool throughout the colon suggesting constipation. 2. Stable right upper lobe lung mass. 3. No acute findings identified.   Electronically Signed   By: Richardean Sale M.D.   On: 06/06/2015 16:17    ASSESSMENT & PLAN:    Mrs. 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  CEA level within normal limits. CA-19-9 level somewhat elevated likely due to metastases to the pancreas and less consistent with primary pancreatic metastatic cancer. LDH level within normal limits. Good ECOG performance status is 1 #2 Painful metastases to the spinal muscles over upper back. #3 Shortness of breath due to her lung mass causing some  airway compression. #4 Symptomatic anemia due to her metastatic cancer. #5 Pain due to her cancer #6 Microcytic anemia due to anemia of chronic disease ferritin level 171.  #7 Bilateral adrenal metastases with high risk for adrenal insufficiency. #8 dysphagia -no intraluminal masses on esophagogram . This symptom is likely from extrinsic compression from right upper lobe lung mass. #9 history of colorectal cancer diagnosed in 2000 treated with surgery followed by chemotherapy and radiation for her daughter. She had recurrence in 2001 when she was treated with further surgery and intraoperative radiation.  #10 Delirium - Likely related to hyponatremia as a function of dehydration/solute intake and some element of SIADH. No evidence of infection.  Patient was noted to be constipated based on the abdominal x-ray and is receiving laxatives to address this. Cannot rule out neuro-excitatory toxicity from opiates (morphine is typically the culprit due to greater chance of accumulation of metabolities) CT head with no evidence of brain mets Now resolved #11 bilateral lower extremity swelling likely related to hypoalbuminemia from her  cancer.  Plan -patient ok to discharge from oncology standpoint to TCU for continued PT and nutritional optimization. -Nivolumab due today rescheduled for Monday 06/12/2015 -will rpt PET/CT after 2-3 months of Nivolumab. -Continue daily palliative radiation therapy if possible to avoid break in treatment. -will continue to followup in clinic -feels better after PRBC transfusion and hydration.     Sullivan Lone MD [place] AAHIVMS [facility] Hematology/Oncology Physician [facility]  (Office):       [phone] (Work cell):  [phone] (Fax):           [phone]  06/09/2015 10:19 PM

## 2015-06-11 ENCOUNTER — Encounter: Payer: Self-pay | Admitting: Hematology

## 2015-06-12 ENCOUNTER — Ambulatory Visit (HOSPITAL_BASED_OUTPATIENT_CLINIC_OR_DEPARTMENT_OTHER): Payer: Medicaid Other

## 2015-06-12 ENCOUNTER — Other Ambulatory Visit: Payer: Medicaid Other

## 2015-06-12 ENCOUNTER — Ambulatory Visit: Payer: Medicaid Other

## 2015-06-12 ENCOUNTER — Encounter: Payer: Self-pay | Admitting: Radiation Oncology

## 2015-06-12 ENCOUNTER — Ambulatory Visit
Admission: RE | Admit: 2015-06-12 | Discharge: 2015-06-12 | Disposition: A | Discharge status: Home / Self Care | Payer: Medicaid Other | Source: Ambulatory Visit | Attending: Radiation Oncology | Admitting: Radiation Oncology

## 2015-06-12 VITALS — BP 114/54 | HR 92 | Temp 98.8°F | Resp 16 | Ht 59.0 in | Wt 98.0 lb

## 2015-06-12 DIAGNOSIS — K219 Gastro-esophageal reflux disease without esophagitis: Secondary | ICD-10-CM | POA: Diagnosis not present

## 2015-06-12 DIAGNOSIS — Z85038 Personal history of other malignant neoplasm of large intestine: Secondary | ICD-10-CM | POA: Diagnosis not present

## 2015-06-12 DIAGNOSIS — Z5112 Encounter for antineoplastic immunotherapy: Secondary | ICD-10-CM | POA: Diagnosis present

## 2015-06-12 DIAGNOSIS — C3490 Malignant neoplasm of unspecified part of unspecified bronchus or lung: Secondary | ICD-10-CM | POA: Diagnosis not present

## 2015-06-12 DIAGNOSIS — I10 Essential (primary) hypertension: Secondary | ICD-10-CM | POA: Diagnosis not present

## 2015-06-12 DIAGNOSIS — C3491 Malignant neoplasm of unspecified part of right bronchus or lung: Secondary | ICD-10-CM

## 2015-06-12 DIAGNOSIS — F32A Depression, unspecified: Secondary | ICD-10-CM

## 2015-06-12 DIAGNOSIS — C7971 Secondary malignant neoplasm of right adrenal gland: Secondary | ICD-10-CM | POA: Diagnosis not present

## 2015-06-12 DIAGNOSIS — Z510 Encounter for antineoplastic radiation therapy: Secondary | ICD-10-CM | POA: Diagnosis present

## 2015-06-12 DIAGNOSIS — C7972 Secondary malignant neoplasm of left adrenal gland: Secondary | ICD-10-CM | POA: Diagnosis not present

## 2015-06-12 DIAGNOSIS — M549 Dorsalgia, unspecified: Secondary | ICD-10-CM | POA: Diagnosis not present

## 2015-06-12 DIAGNOSIS — Z933 Colostomy status: Secondary | ICD-10-CM | POA: Diagnosis not present

## 2015-06-12 DIAGNOSIS — Z87891 Personal history of nicotine dependence: Secondary | ICD-10-CM | POA: Diagnosis not present

## 2015-06-12 DIAGNOSIS — R0600 Dyspnea, unspecified: Secondary | ICD-10-CM | POA: Diagnosis not present

## 2015-06-12 DIAGNOSIS — Z79899 Other long term (current) drug therapy: Secondary | ICD-10-CM | POA: Diagnosis not present

## 2015-06-12 DIAGNOSIS — F329 Major depressive disorder, single episode, unspecified: Secondary | ICD-10-CM

## 2015-06-12 MED ORDER — RADIAPLEXRX EX GEL
Freq: Once | CUTANEOUS | Status: AC
  Administered 2015-06-12: 17:00:00 via TOPICAL

## 2015-06-12 MED ORDER — NIVOLUMAB CHEMO INJECTION 100 MG/10ML
2.8000 mg/kg | Freq: Once | INTRAVENOUS | Status: AC
  Administered 2015-06-12: 120 mg via INTRAVENOUS
  Filled 2015-06-12: qty 12

## 2015-06-12 MED ORDER — SODIUM CHLORIDE 0.9 % IV SOLN
Freq: Once | INTRAVENOUS | Status: AC
  Administered 2015-06-12: 17:00:00 via INTRAVENOUS

## 2015-06-12 MED ORDER — MIRTAZAPINE 15 MG PO TABS: 15.0000 mg | ORAL_TABLET | Freq: Every day | ORAL | Status: DC

## 2015-06-12 NOTE — Progress Notes (Signed)
Weekly Management Note:  Site: right lung/lower thoracic spine Current Dose:   1800  cGy Projected Dose:  2000  cGy  Narrative: The patient is seen today for routine under treatment assessment. CBCT/MVCT images/port films were reviewed. The chart was reviewed.    Her daughter it tells me that her breathing and pain are improved.  She receives her second cycle of chemotherapy today. Her first cycle was approximately 2 weeks ago.  She is lost 2 more pounds over the past 5 days.  Her appetite is poor. We discussed her diet today.  Physical Examination:  Filed Vitals:   06/12/15 1549  BP: 114/54  Pulse: 92  Temp: 98.8 F (37.1 C)  Resp: 16  .  Weight: 98 lb (44.453 kg).  No change.  Lungs are clear.  Lab Results  Component Value Date   WBC 9.1 06/09/2015   HGB 9.8* 06/09/2015   HCT 28.9* 06/09/2015   MCV 72.6* 06/09/2015   PLT 278 06/09/2015     Impression: Tolerating radiation therapy well.  She will finish her radiation therapy tomorrow.  Plan: Continue radiation therapy as planned.

## 2015-06-12 NOTE — Progress Notes (Signed)
Lissa Rowles has completed 9 fractions to her chest.  She denies pain.  She is taking oxycodone and morphine.  Her daughter reports this makes her sleepy.  Her daughter reports that it is hard to get her to eat or drink anything.  She has lost 2 lbs since the 14th.  She denies shortness of breath and coughing .  She has swelling in both lower legs that has gotten worse over the past 2 days.  Her daughter reports she was on a diuretic in the hospital and this was not continued at home.  She is going to have chemotherapy today.  BP 114/54 mmHg  Pulse 92  Temp(Src) 98.8 F (37.1 C) (Oral)  Resp 16  Ht '4\' 11"'$  (1.499 m)  Wt 98 lb (44.453 kg)  BMI 19.78 kg/m2  SpO2 100%

## 2015-06-12 NOTE — Patient Instructions (Signed)
[facility] Discharge Instructions for Patients Receiving Chemotherapy  Today you received the following chemotherapy agents nivolumab  To help prevent nausea and vomiting after your treatment, we encourage you to take your nausea medication    If you develop nausea and vomiting that is not controlled by your nausea medication, call the clinic.   BELOW ARE SYMPTOMS THAT SHOULD BE REPORTED IMMEDIATELY:  *FEVER GREATER THAN 100.5 F  *CHILLS WITH OR WITHOUT FEVER  NAUSEA AND VOMITING THAT IS NOT CONTROLLED WITH YOUR NAUSEA MEDICATION  *UNUSUAL SHORTNESS OF BREATH  *UNUSUAL BRUISING OR BLEEDING  TENDERNESS IN MOUTH AND THROAT WITH OR WITHOUT PRESENCE OF ULCERS  *URINARY PROBLEMS  *BOWEL PROBLEMS  UNUSUAL RASH Items with * indicate a potential emergency and should be followed up as soon as possible.  Feel free to call the clinic you have any questions or concerns. The clinic phone number is [phone].  Please show the [place] at check-in to the Emergency Department and triage nurse.

## 2015-06-12 NOTE — Addendum Note (Signed)
Encounter addended by: Jacqulyn Liner, RN on: 06/12/2015  5:01 PM<BR>     Documentation filed: Orders, Dx Association

## 2015-06-12 NOTE — Addendum Note (Signed)
Encounter addended by: Jacqulyn Liner, RN on: 06/12/2015  5:06 PM<BR>     Documentation filed: Inpatient MAR

## 2015-06-13 ENCOUNTER — Telehealth: Payer: Self-pay | Admitting: Hematology

## 2015-06-13 ENCOUNTER — Ambulatory Visit
Admission: RE | Admit: 2015-06-13 | Discharge: 2015-06-13 | Disposition: A | Discharge status: Home / Self Care | Payer: Medicaid Other | Source: Ambulatory Visit | Attending: Radiation Oncology | Admitting: Radiation Oncology

## 2015-06-13 ENCOUNTER — Other Ambulatory Visit: Payer: Medicaid Other

## 2015-06-13 ENCOUNTER — Encounter: Payer: Self-pay | Admitting: Radiation Oncology

## 2015-06-13 DIAGNOSIS — Z510 Encounter for antineoplastic radiation therapy: Secondary | ICD-10-CM | POA: Diagnosis not present

## 2015-06-13 NOTE — Telephone Encounter (Signed)
per pof to sch pt appt-gave pt copy of avs °

## 2015-06-13 NOTE — Progress Notes (Addendum)
[facility] End of Treatment Note  Name:Ashley Green  Date: 06/13/2015 GIT:[id number] DOB:08/26/1928   Status:outpatient    CC: [name], DO , Dr. Sullivan Lone  REFERRING PHYSICIAN: Dr. 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  She remained frail during her course of therapy, and I do not plan on a second course of palliative radiotherapy at this time.  We will see how she is doing in one month.  She continues with systemic therapy through Dr. Irene Limbo.  Her blood counts remained satisfactory.                          PLAN: Routine followup in one month. Patient instructed to call if questions or worsening complaints in interim.

## 2015-06-14 ENCOUNTER — Ambulatory Visit: Payer: Medicaid Other

## 2015-06-15 ENCOUNTER — Ambulatory Visit: Payer: Medicaid Other

## 2015-06-15 ENCOUNTER — Non-Acute Institutional Stay (SKILLED_NURSING_FACILITY): Payer: Medicaid Other | Admitting: Internal Medicine

## 2015-06-15 DIAGNOSIS — C3490 Malignant neoplasm of unspecified part of unspecified bronchus or lung: Secondary | ICD-10-CM

## 2015-06-15 DIAGNOSIS — K219 Gastro-esophageal reflux disease without esophagitis: Secondary | ICD-10-CM

## 2015-06-15 DIAGNOSIS — E8809 Other disorders of plasma-protein metabolism, not elsewhere classified: Secondary | ICD-10-CM

## 2015-06-15 DIAGNOSIS — C799 Secondary malignant neoplasm of unspecified site: Secondary | ICD-10-CM

## 2015-06-15 DIAGNOSIS — D638 Anemia in other chronic diseases classified elsewhere: Secondary | ICD-10-CM

## 2015-06-15 DIAGNOSIS — E871 Hypo-osmolality and hyponatremia: Secondary | ICD-10-CM | POA: Diagnosis not present

## 2015-06-16 ENCOUNTER — Other Ambulatory Visit: Payer: Self-pay | Admitting: *Deleted

## 2015-06-21 ENCOUNTER — Encounter: Payer: Self-pay | Admitting: Hematology

## 2015-06-21 ENCOUNTER — Other Ambulatory Visit (HOSPITAL_BASED_OUTPATIENT_CLINIC_OR_DEPARTMENT_OTHER): Payer: Medicaid Other

## 2015-06-21 ENCOUNTER — Ambulatory Visit (HOSPITAL_BASED_OUTPATIENT_CLINIC_OR_DEPARTMENT_OTHER): Payer: Medicaid Other | Admitting: Hematology

## 2015-06-21 DIAGNOSIS — C7971 Secondary malignant neoplasm of right adrenal gland: Secondary | ICD-10-CM | POA: Diagnosis not present

## 2015-06-21 DIAGNOSIS — Z79899 Other long term (current) drug therapy: Secondary | ICD-10-CM | POA: Diagnosis not present

## 2015-06-21 DIAGNOSIS — C3490 Malignant neoplasm of unspecified part of unspecified bronchus or lung: Secondary | ICD-10-CM

## 2015-06-21 DIAGNOSIS — K1232 Oral mucositis (ulcerative) due to other drugs: Secondary | ICD-10-CM

## 2015-06-21 DIAGNOSIS — C772 Secondary and unspecified malignant neoplasm of intra-abdominal lymph nodes: Secondary | ICD-10-CM

## 2015-06-21 DIAGNOSIS — C3491 Malignant neoplasm of unspecified part of right bronchus or lung: Secondary | ICD-10-CM

## 2015-06-21 DIAGNOSIS — J189 Pneumonia, unspecified organism: Secondary | ICD-10-CM

## 2015-06-21 DIAGNOSIS — C7972 Secondary malignant neoplasm of left adrenal gland: Secondary | ICD-10-CM | POA: Diagnosis not present

## 2015-06-21 DIAGNOSIS — C7989 Secondary malignant neoplasm of other specified sites: Secondary | ICD-10-CM

## 2015-06-21 DIAGNOSIS — R1084 Generalized abdominal pain: Secondary | ICD-10-CM

## 2015-06-21 LAB — MAGNESIUM (CC13): Magnesium: 2 mg/dl (ref 1.5–2.5)

## 2015-06-21 LAB — CBC & DIFF AND RETIC
BASO%: 0.1 % (ref 0.0–2.0)
Basophils Absolute: 0 10*3/uL (ref 0.0–0.1)
EOS%: 0.3 % (ref 0.0–7.0)
Eosinophils Absolute: 0 10*3/uL (ref 0.0–0.5)
HCT: 31.3 % — ABNORMAL LOW (ref 34.8–46.6)
HGB: 10.4 g/dL — ABNORMAL LOW (ref 11.6–15.9)
Immature Retic Fract: 17.5 % — ABNORMAL HIGH (ref 1.60–10.00)
LYMPH%: 2.9 % — AB (ref 14.0–49.7)
MCH: 24.4 pg — AB (ref 25.1–34.0)
MCHC: 33.2 g/dL (ref 31.5–36.0)
MCV: 73.3 fL — AB (ref 79.5–101.0)
MONO#: 0.9 10*3/uL (ref 0.1–0.9)
MONO%: 6.3 % (ref 0.0–14.0)
NEUT%: 90.4 % — ABNORMAL HIGH (ref 38.4–76.8)
NEUTROS ABS: 12.1 10*3/uL — AB (ref 1.5–6.5)
PLATELETS: 370 10*3/uL (ref 145–400)
RBC: 4.27 10*6/uL (ref 3.70–5.45)
RDW: 21.7 % — ABNORMAL HIGH (ref 11.2–14.5)
Retic %: 0.69 % — ABNORMAL LOW (ref 0.70–2.10)
Retic Ct Abs: 29.46 10*3/uL — ABNORMAL LOW (ref 33.70–90.70)
WBC: 13.4 10*3/uL — AB (ref 3.9–10.3)
lymph#: 0.4 10*3/uL — ABNORMAL LOW (ref 0.9–3.3)

## 2015-06-21 LAB — COMPREHENSIVE METABOLIC PANEL (CC13)
ALBUMIN: 1.9 g/dL — AB (ref 3.5–5.0)
ALK PHOS: 119 U/L (ref 40–150)
ALT: 18 U/L (ref 0–55)
ANION GAP: 15 meq/L — AB (ref 3–11)
AST: 26 U/L (ref 5–34)
BUN: 17.1 mg/dL (ref 7.0–26.0)
CO2: 24 meq/L (ref 22–29)
Calcium: 8.9 mg/dL (ref 8.4–10.4)
Chloride: 98 mEq/L (ref 98–109)
Creatinine: 0.6 mg/dL (ref 0.6–1.1)
EGFR: 81 mL/min/{1.73_m2} — ABNORMAL LOW (ref >90)
GLUCOSE: 108 mg/dL (ref 70–140)
POTASSIUM: 4.5 meq/L (ref 3.5–5.1)
SODIUM: 137 meq/L (ref 136–145)
Total Bilirubin: 0.36 mg/dL (ref 0.20–1.20)
Total Protein: 6.7 g/dL (ref 6.4–8.3)

## 2015-06-21 LAB — LIPASE: LIPASE: 30 U/L (ref 7–60)

## 2015-06-21 LAB — AMYLASE: AMYLASE: 25 U/L (ref 0–105)

## 2015-06-21 MED ORDER — DEXAMETHASONE 0.5 MG/5ML PO SOLN: 0.5000 mg | Freq: Two times a day (BID) | ORAL | Status: AC

## 2015-06-21 MED ORDER — NYSTATIN 100000 UNIT/ML MT SUSP: 5.0000 mL | Freq: Four times a day (QID) | OROMUCOSAL | Status: DC

## 2015-06-21 MED ORDER — DEXAMETHASONE 0.5 MG/5ML PO SOLN: 0.5000 mg | Freq: Two times a day (BID) | ORAL | Status: DC

## 2015-06-21 MED ORDER — NYSTATIN 100000 UNIT/ML MT SUSP: 5.0000 mL | Freq: Four times a day (QID) | OROMUCOSAL | Status: AC

## 2015-06-22 ENCOUNTER — Telehealth: Payer: Self-pay | Admitting: Hematology

## 2015-06-22 ENCOUNTER — Other Ambulatory Visit: Payer: Self-pay | Admitting: *Deleted

## 2015-06-22 LAB — TSH CHCC: TSH: 6.453 m[IU]/L — AB (ref 0.308–3.960)

## 2015-06-22 NOTE — Telephone Encounter (Signed)
Aware of 10/3 appointment

## 2015-06-22 NOTE — Progress Notes (Signed)
.    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  Current Treatment:  Nivolumab 3mg/kg q2weeks  HISTORY OF PRESENTING ILLNESS: Please see my previous clinic note for details of initial presentation.  Interval History  Ashley Green is here for follow-up with her daughter. She is still in her skilled nursing facility. She completed her palliative radiation to the right upper lobe lung mass and paraspinal thoracic masses on 06/13/2015. Appears to have improvement in her back pain and to some extent her dyspnea. Continues to have intermittent confusion. Was apparently noted to have a fever and possible right-sided pneumonia on the chest x-ray at her skilled nursing facility and has been started on moxifloxacin with resolution of fevers. Still confused on and off as per her daughter. Still prefers to drink liquids only. Marginal oral intake. She was counseled to try to eat as much as she can. Notes some grade 1 mucositis and was started on dexamethasone mouthwashes. Some concern for thrush was also given nystatin mouthwashes. Mood her bowels well. No complaints of pain at this time. No headaches. She appears to be sleeping much better after being started on the Remeron. Leg swelling improved. She is smiling some and trying to make Jokes. Her daughter mentions that she was visited by family from [place] and [place] which gave her some joy.  MEDICAL HISTORY:  Past Medical History  Diagnosis Date  . Hypertension   . Colon cancer   . Bowel obstruction   . GERD (gastroesophageal reflux disease) 10/15/2012  . Adenocarcinoma, lung     SURGICAL HISTORY: Past Surgical History  Procedure Laterality Date  .  Colon surgery    . Colectomy  s/p colon cancer 2000    . Colostomy      SOCIAL HISTORY: Social History   Social History  . Marital Status: Single    Spouse Name: N/A  . Number of Children: 3  . Years of Education: N/A   Occupational History  . retired    Social History Main Topics  . Smoking status: Never Smoker   . Smokeless tobacco: Never Used  . Alcohol Use: No  . Drug Use: No  . Sexual Activity: No   Other Topics Concern  . 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     FAMILY HISTORY: Family History  Problem Relation Age of Onset  . Hodgkin's lymphoma Son   . Heart attack Mother   . Brain cancer Sister   . Pneumonia Father   . Alcoholism Brother   . Pneumonia Brother     ALLERGIES:  has No Known Allergies.  MEDICATIONS:  Current Outpatient Prescriptions  Medication Sig Dispense Refill  . cyanocobalamin 2000 MCG tablet Take 2,000 mcg by mouth 2 (two) times daily.    . dexamethasone (DECADRON) 0.5 MG/5ML solution Take 5 mLs (0.5 mg total) by mouth 2 (two) times daily. Hold in the mouth and swish in the mouth for as long as possible 100 mL 0  . Docusate Calcium (STOOL SOFTENER PO) Take 1 tablet by mouth every evening.     . feeding supplement (BOOST / RESOURCE BREEZE) LIQD Take 1 Container by mouth 3 (three) times daily between meals. 60 Container 0  . magnesium citrate SOLN Take   0.5 Bottles by mouth daily as needed (constipation).     . mirtazapine (REMERON) 15 MG tablet Take 1 tablet (15 mg total) by mouth at bedtime. 30 tablet 0  . morphine (ROXANOL) 20 MG/ML concentrated solution Take 0.25 mLs (5 mg total) by mouth every 4 (four) hours as needed for severe pain or breakthrough pain. 200 mL 0  . nystatin (MYCOSTATIN) 100000 UNIT/ML suspension Take 5 mLs (500,000 Units total) by mouth 4 (four) times daily. 200 mL 0  . omeprazole (PRILOSEC) 40 MG capsule Take 1 capsule (40 mg total) by mouth daily. 30 capsule 3  . ondansetron  (ZOFRAN-ODT) 4 MG disintegrating tablet Take 1 tablet (4 mg total) by mouth every 8 (eight) hours as needed for nausea or vomiting. 20 tablet 0  . Ostomy Supplies (NATURA STOMAHESIVE MOLDABLE) WAFR 1 each by Does not apply route 2 (two) times daily. 60 Wafer 12  . OxyCODONE (OXYCONTIN) 10 mg T12A 12 hr tablet Take 1 tablet (10 mg total) by mouth every 12 (twelve) hours. 60 tablet 0  . polyethylene glycol (MIRALAX / GLYCOLAX) packet Take 17 g by mouth daily. 14 each 0  . senna-docusate (SENOKOT-S) 8.6-50 MG per tablet Take 2 tablets by mouth 2 (two) times daily. 30 tablet 0   No current facility-administered medications for this visit.    REVIEW OF SYSTEMS:    New leg Swelling, upper abdominal pain and abdominal distention.   PHYSICAL EXAMINATION: ECOG PERFORMANCE STATUS: 1 - Symptomatic but completely ambulatory Vital signs as per Epic afebrile GENERAL: alert, appears more comfortable today. With no pain. No respiratory distress  SKIN:Slightly reduced back mass. dominant and a few small right abdominal wall nodules. EYES: normal, conjunctiva mild pallor, sclera clear anicteric  OROPHARYNX Some pharyngeal erythema and angular cheilitis , mild mucositis NECK: supple, thyroid normal size, non-tender, without nodularity LYMPH:  no palpable lymphadenopathy in cervical, axillary or inguinal LUNGS: Slightly decreased right-sided breath sounds with few rales, left side lung clear.  HEART: regular rate & rhythm and no murmurs  ABDOMEN:abdomen soft, non-tender and normal bowel sounds, colostomy in situ  Musculoskeletal b/l lower extremity 1+ pitting pedal edema PSYCH: alert & oriented x 2, intermittently confused. NEURO:  forgetfulness , intermittently confused, no focal motor/sensory deficits  LABORATORY DATA:  . CBC Latest Ref Rng 06/21/2015 06/09/2015 06/08/2015  WBC 3.9 - 10.3 10e3/uL 13.4(H) 9.1 8.7  Hemoglobin 11.6 - 15.9 g/dL 10.4(L) 9.8(L) 7.3(L)  Hematocrit 34.8 - 46.6 % 31.3(L)  28.9(L) 21.6(L)  Platelets 145 - 400 10e3/uL 370 278 267    Recent Labs  06/06/15 1130  06/07/15 1423 06/08/15 0350 06/09/15 0405 06/21/15 1524  NA 123*  < > 129* 131* 132* 137  K 4.7  < > 3.9 3.8 4.1 4.5  CL  --   < > 96* 98* 100*  --   CO2 23  < > _0 GLUCOSE 125  < > 92 84 108* 108  BUN 13.6  < > _1 17.1  CREATININE 0.6  < > 0.44 0.45 0.46 0.6  CALCIUM 8.7  < > 7.8* 7.8* 8.0* 8.9  GFRNONAA  --   < > >60 >60 >60  --   GFRAA  --   < > >60 >60 >60  --   PROT 6.1*  --   --  5.0*  --  6.7  ALBUMIN 2.1*  --   --  2.1*  --  1.9*  AST 33  --   --  21  --  26  ALT 28  --   --  17  --  18  ALKPHOS 104  --   --  62  --  119  BILITOT 0.44  --   --  0.7  --  0.36  < > = values in this interval not displayed.    RADIOGRAPHIC STUDIES: I have personally reviewed the radiological images as listed and agreed with the findings in the report. Ct Head Wo Contrast  06/06/2015   CLINICAL DATA:  Confusion starting yesterday. Metastatic adenocarcinoma of the lung.  EXAM: CT HEAD WITHOUT CONTRAST  TECHNIQUE: Contiguous axial images were obtained from the base of the skull through the vertex without intravenous contrast.  COMPARISON:  Brain MR off 03/22/2011  FINDINGS: Sinuses/Soft tissues: Hypoplastic right frontal sinus. Other paranasal sinuses and mastoid air cells are clear.  Intracranial: Expected cerebral and cerebellar volume loss for age. Relatively mild for age low density in the periventricular white matter likely related to small vessel disease. No mass lesion, hemorrhage, hydrocephalus, acute infarct, intra-axial, or extra-axial fluid collection.  IMPRESSION: 1.  No acute intracranial abnormality. 2. Mild cerebral atrophy and small vessel ischemic change.   Electronically Signed   By: Kyle  Talbot M.D.   On: 06/06/2015 19:28   Us Biopsy  05/23/2015   INDICATION: History of metastatic poorly differentiated lung carcinoma with sarcomatoid features, post ultrasound-guided biopsy  of dominant hypermetabolic nodule within the subcutaneous tissues of the back performed on 05/08/2015.  Request made for repeat biopsy of an additional hypermetabolic subcutaneous nodule within the subcutaneous tissues of the right upper abdominal quadrant for EGFR mutation testing, alk gene rearrangement and Ros 1 testing.  EXAM: ULTRASOUND GUIDED BIOPSY OF HYPERMETABOLIC NODULE WITHIN THE SUBCUTANEOUS TISSUES WITHIN THE RIGHT UPPER ABDOMEN  COMPARISON:  PET-CT - 05/04/2015; ultrasound-guided biopsy of dominant hypermetabolic mass with the subcutaneous tissues of the back - 05/08/2015  MEDICATIONS: None  ANESTHESIA/SEDATION: Conscious sedation was achieved with intravenous Versed and Fentanyl.  Total Moderate Sedation time  15 minutes  COMPLICATIONS: None immediate  PROCEDURE: Informed written consent was obtained from the patient and the patient's daughter via the use of a medical translator after a discussion of the risks, benefits and alternatives to treatment. The patient understands and consents the procedure. A timeout was performed prior to the initiation of the procedure.  Ultrasound scanning was performed of the right upper abdominal quadrant demonstrates a well-defined punctate approximately 1.3 x 1.2 cm hypoechoic nodule within the subcutaneous tissues of the right anterior lateral abdomen compatible with the hypermetabolic nodule seen on preceding PET-CT.  The right upper abdominal quadrant was prepped and draped in the usual sterile fashion. The overlying soft tissues were anesthetized with 1% lidocaine with epinephrine. This was followed by 6 core biopsies with an 18 gauge core device under direct ultrasound guidance. Multiple ultrasound images were saved for procedural documentation purposes.  Hemostasis was achieved with manual compression. Post procedural scanning was negative for definitive area of hemorrhage or additional complication. A dressing was placed. The patient tolerated the procedure  well without immediate post procedural complication.  IMPRESSION: Technically successful ultrasound guided core needle biopsy of hypermetabolic nodule within the subcutaneous tissues of the right anterior lateral abdomen.   Electronically Signed   By: John  Watts M.D.   On: 05/23/2015 15:04   Dg Abd Acute W/chest  06/06/2015   CLINICAL DATA:  Nausea, vomiting, shortness of breath and weakness. History of colon cancer and lung cancer. Recent soft tissue   biopsies demonstrated metastatic poorly differentiated carcinoma with sarcomatoid features. Initial encounter.  EXAM: DG ABDOMEN ACUTE W/ 1V CHEST  COMPARISON:  PET CT 05/04/2015. Acute abdominal series done 10/23/2009.  FINDINGS: Large right upper lobe mass is grossly stable from recent PET-CT. The heart size and mediastinal contours are stable. The lungs are otherwise clear.  The bowel gas pattern is nonobstructive. There is no free intraperitoneal air. A moderate amount of stool is present throughout the colon. Multiple pelvic surgical clips are present. There are degenerative changes throughout the thoracolumbar spine.  IMPRESSION: 1. Prominent stool throughout the colon suggesting constipation. 2. Stable right upper lobe lung mass. 3. No acute findings identified.   Electronically Signed   By: Richardean Sale M.D.   On: 06/06/2015 16:17    ASSESSMENT & PLAN:   Ashley Green is a very pleasant 81 year old Hispanic female in good overall health with ECOG performance status of 1 and previous history of colon cancer in 2000 and 2001 now with  #1 Stage IV Metastatic poorly differentiated lung carcinoma with sarcomatoid features with  mediastinal adenopathy, metastases to bilateral adrenal glands, paraspinal muscles and pancreas as well as abdominal lymph nodes and skin.  CEA level within normal limits. CA-19-9 level somewhat elevated likely due to metastases to the pancreas and less consistent with primary pancreatic metastatic cancer. LDH level within normal  limits. ECOG performance status is 2 #2  Painful metastases to the spinal muscles over upper back. #3 Shortness of breath due to her lung mass causing some airway compression. #4  Symptomatic anemia due to her metastatic cancer. #5 Pain due to her cancer #6 Microcytic anemia due to anemia of chronic disease ferritin level 171.  #7 Bilateral adrenal metastases with high risk for adrenal insufficiency. #8 dysphagia -no intraluminal masses on esophagogram . This symptom is likely from extrinsic compression from right upper lobe lung mass. #9 history of colorectal cancer diagnosed in 2000 treated with surgery followed by chemotherapy and radiation for her daughter. She had recurrence in 2001 when she was treated with further surgery and intraoperative radiation.  #10 Delirium - improved compared to previous situation where she was admitted for hyponatremia and anemia . Still with some intermittent confusion and forgetfulness . Noted to have a new pneumonia 06/18/2015 and is currently on moxifloxacin. Poor oral intake  #11 healthcare acquired pneumonia diagnosed in the nursing home on chest x-ray on 06/17/2015 that showed a right-sided infiltrate and some effusion. Was started on Avelox and has been afebrile with breathing. On clinical examination today has some decreased breath sounds at the base suggesting some pleural effusion. Does not complain of overt worsening of breathing. Could potentially have an element of radiation pneumonitis as well. #12 grade 1 oral mucositis due to Nivolumab  Plan Complete course of antibiotics for her pneumonia.- -Continue optimal pain management. Might consider switching sublingual morphine to Dilaudid to potentially reduce her confusion. Alternatively we'll try to transition her to a fentanyl patch. -A chest x-ray on 06/26/2015 for interval evaluation of her pneumonia and pleural effusion. -Swallow evaluation on or before 06/26/2015 to evaluate for any persistent  dysphagia from compressive symptoms of the lung mass and to assess aspiration risk. -Given dexamethasone mouthwashes for oral mucositis and nystatin swish and swallow for possible early thrush. -Encouraged patient to improve oral intake. -If persistent low-level delirium might consider switching Remeron to low-dose Seroquel. -Next dose of Nivolumab on 06/26/2015. -will see her in the Infusion room. -We will plan to get a restaging PET/CT scan  before her fourth cycle of Nivolumab to assess response to RT and her 3 cycles of Nivolumab. -Continue therapies at the nursing home.  I will see the patient in the infusion room on 10/3 and then in 3 weeks with PET/CT  Patient was seen in the presence of her daughter who helped with translation.  Appreciate the help from the thoracic nurse navigator Dana Herndon.   Gautam Kale MD MS AAHIVMS [facility] Hematology/Oncology Physician [facility]  (Office):       [phone] (Work cell):  [phone] (Fax):           [phone] 

## 2015-06-23 ENCOUNTER — Other Ambulatory Visit: Payer: Self-pay | Admitting: *Deleted

## 2015-06-23 DIAGNOSIS — C3490 Malignant neoplasm of unspecified part of unspecified bronchus or lung: Secondary | ICD-10-CM

## 2015-06-26 ENCOUNTER — Inpatient Hospital Stay (HOSPITAL_COMMUNITY)
Admission: AD | Admit: 2015-06-26 | Discharge: 2015-07-01 | DRG: 166 | Disposition: A | Discharge status: Hospice | Payer: Medicaid Other | Source: Ambulatory Visit | Attending: Internal Medicine | Admitting: Internal Medicine

## 2015-06-26 ENCOUNTER — Inpatient Hospital Stay (HOSPITAL_COMMUNITY): Payer: Medicaid Other

## 2015-06-26 ENCOUNTER — Other Ambulatory Visit (HOSPITAL_BASED_OUTPATIENT_CLINIC_OR_DEPARTMENT_OTHER): Payer: Medicaid Other

## 2015-06-26 ENCOUNTER — Ambulatory Visit (HOSPITAL_BASED_OUTPATIENT_CLINIC_OR_DEPARTMENT_OTHER): Payer: Medicaid Other

## 2015-06-26 ENCOUNTER — Encounter (HOSPITAL_COMMUNITY): Payer: Self-pay | Admitting: *Deleted

## 2015-06-26 VITALS — BP 117/48 | HR 120 | Temp 97.8°F | Resp 30

## 2015-06-26 DIAGNOSIS — C7989 Secondary malignant neoplasm of other specified sites: Secondary | ICD-10-CM | POA: Diagnosis present

## 2015-06-26 DIAGNOSIS — C3491 Malignant neoplasm of unspecified part of right bronchus or lung: Secondary | ICD-10-CM

## 2015-06-26 DIAGNOSIS — G893 Neoplasm related pain (acute) (chronic): Secondary | ICD-10-CM | POA: Diagnosis present

## 2015-06-26 DIAGNOSIS — R0602 Shortness of breath: Secondary | ICD-10-CM | POA: Diagnosis present

## 2015-06-26 DIAGNOSIS — Z85118 Personal history of other malignant neoplasm of bronchus and lung: Secondary | ICD-10-CM | POA: Diagnosis not present

## 2015-06-26 DIAGNOSIS — J9601 Acute respiratory failure with hypoxia: Secondary | ICD-10-CM | POA: Diagnosis not present

## 2015-06-26 DIAGNOSIS — J90 Pleural effusion, not elsewhere classified: Secondary | ICD-10-CM | POA: Diagnosis not present

## 2015-06-26 DIAGNOSIS — R531 Weakness: Secondary | ICD-10-CM | POA: Diagnosis not present

## 2015-06-26 DIAGNOSIS — R590 Localized enlarged lymph nodes: Secondary | ICD-10-CM | POA: Diagnosis present

## 2015-06-26 DIAGNOSIS — K219 Gastro-esophageal reflux disease without esophagitis: Secondary | ICD-10-CM | POA: Diagnosis present

## 2015-06-26 DIAGNOSIS — C3490 Malignant neoplasm of unspecified part of unspecified bronchus or lung: Principal | ICD-10-CM | POA: Diagnosis present

## 2015-06-26 DIAGNOSIS — Z807 Family history of other malignant neoplasms of lymphoid, hematopoietic and related tissues: Secondary | ICD-10-CM | POA: Diagnosis not present

## 2015-06-26 DIAGNOSIS — Z7952 Long term (current) use of systemic steroids: Secondary | ICD-10-CM

## 2015-06-26 DIAGNOSIS — Z79899 Other long term (current) drug therapy: Secondary | ICD-10-CM | POA: Diagnosis not present

## 2015-06-26 DIAGNOSIS — Z808 Family history of malignant neoplasm of other organs or systems: Secondary | ICD-10-CM | POA: Diagnosis not present

## 2015-06-26 DIAGNOSIS — G9340 Encephalopathy, unspecified: Secondary | ICD-10-CM | POA: Diagnosis present

## 2015-06-26 DIAGNOSIS — R1310 Dysphagia, unspecified: Secondary | ICD-10-CM | POA: Diagnosis present

## 2015-06-26 DIAGNOSIS — D509 Iron deficiency anemia, unspecified: Secondary | ICD-10-CM | POA: Diagnosis present

## 2015-06-26 DIAGNOSIS — C7972 Secondary malignant neoplasm of left adrenal gland: Secondary | ICD-10-CM | POA: Diagnosis not present

## 2015-06-26 DIAGNOSIS — J9621 Acute and chronic respiratory failure with hypoxia: Secondary | ICD-10-CM | POA: Diagnosis present

## 2015-06-26 DIAGNOSIS — R0600 Dyspnea, unspecified: Secondary | ICD-10-CM

## 2015-06-26 DIAGNOSIS — Z933 Colostomy status: Secondary | ICD-10-CM

## 2015-06-26 DIAGNOSIS — Z79891 Long term (current) use of opiate analgesic: Secondary | ICD-10-CM | POA: Diagnosis not present

## 2015-06-26 DIAGNOSIS — R627 Adult failure to thrive: Secondary | ICD-10-CM | POA: Diagnosis present

## 2015-06-26 DIAGNOSIS — Z7189 Other specified counseling: Secondary | ICD-10-CM | POA: Diagnosis not present

## 2015-06-26 DIAGNOSIS — E43 Unspecified severe protein-calorie malnutrition: Secondary | ICD-10-CM | POA: Diagnosis present

## 2015-06-26 DIAGNOSIS — F05 Delirium due to known physiological condition: Secondary | ICD-10-CM | POA: Insufficient documentation

## 2015-06-26 DIAGNOSIS — Z66 Do not resuscitate: Secondary | ICD-10-CM | POA: Diagnosis present

## 2015-06-26 DIAGNOSIS — I10 Essential (primary) hypertension: Secondary | ICD-10-CM | POA: Diagnosis present

## 2015-06-26 DIAGNOSIS — L8990 Pressure ulcer of unspecified site, unspecified stage: Secondary | ICD-10-CM | POA: Diagnosis present

## 2015-06-26 DIAGNOSIS — D630 Anemia in neoplastic disease: Secondary | ICD-10-CM | POA: Diagnosis not present

## 2015-06-26 DIAGNOSIS — E785 Hyperlipidemia, unspecified: Secondary | ICD-10-CM | POA: Diagnosis present

## 2015-06-26 DIAGNOSIS — C7971 Secondary malignant neoplasm of right adrenal gland: Secondary | ICD-10-CM | POA: Diagnosis present

## 2015-06-26 DIAGNOSIS — Z85038 Personal history of other malignant neoplasm of large intestine: Secondary | ICD-10-CM | POA: Diagnosis not present

## 2015-06-26 DIAGNOSIS — Z515 Encounter for palliative care: Secondary | ICD-10-CM | POA: Diagnosis not present

## 2015-06-26 DIAGNOSIS — L988 Other specified disorders of the skin and subcutaneous tissue: Secondary | ICD-10-CM | POA: Diagnosis present

## 2015-06-26 DIAGNOSIS — R410 Disorientation, unspecified: Secondary | ICD-10-CM | POA: Diagnosis present

## 2015-06-26 DIAGNOSIS — J910 Malignant pleural effusion: Secondary | ICD-10-CM | POA: Diagnosis present

## 2015-06-26 DIAGNOSIS — Z8249 Family history of ischemic heart disease and other diseases of the circulatory system: Secondary | ICD-10-CM

## 2015-06-26 DIAGNOSIS — J9620 Acute and chronic respiratory failure, unspecified whether with hypoxia or hypercapnia: Secondary | ICD-10-CM | POA: Insufficient documentation

## 2015-06-26 DIAGNOSIS — Z9889 Other specified postprocedural states: Secondary | ICD-10-CM

## 2015-06-26 DIAGNOSIS — K1232 Oral mucositis (ulcerative) due to other drugs: Secondary | ICD-10-CM

## 2015-06-26 DIAGNOSIS — K1230 Oral mucositis (ulcerative), unspecified: Secondary | ICD-10-CM | POA: Diagnosis present

## 2015-06-26 DIAGNOSIS — R0689 Other abnormalities of breathing: Secondary | ICD-10-CM

## 2015-06-26 LAB — COMPREHENSIVE METABOLIC PANEL (CC13)
ALT: 15 U/L (ref 0–55)
ANION GAP: 13 meq/L — AB (ref 3–11)
AST: 20 U/L (ref 5–34)
Albumin: 1.7 g/dL — ABNORMAL LOW (ref 3.5–5.0)
Alkaline Phosphatase: 97 U/L (ref 40–150)
BUN: 16.4 mg/dL (ref 7.0–26.0)
CALCIUM: 8.6 mg/dL (ref 8.4–10.4)
CO2: 23 meq/L (ref 22–29)
CREATININE: 0.6 mg/dL (ref 0.6–1.1)
Chloride: 103 mEq/L (ref 98–109)
EGFR: 83 mL/min/{1.73_m2} — ABNORMAL LOW (ref >90)
Glucose: 132 mg/dl (ref 70–140)
Potassium: 4.3 mEq/L (ref 3.5–5.1)
Sodium: 139 mEq/L (ref 136–145)
TOTAL PROTEIN: 5.9 g/dL — AB (ref 6.4–8.3)

## 2015-06-26 LAB — CBC WITH DIFFERENTIAL/PLATELET
BASO%: 0.2 % (ref 0.0–2.0)
Basophils Absolute: 0 10*3/uL (ref 0.0–0.1)
EOS ABS: 0 10*3/uL (ref 0.0–0.5)
EOS%: 0.3 % (ref 0.0–7.0)
HEMATOCRIT: 29.8 % — AB (ref 34.8–46.6)
HGB: 9.8 g/dL — ABNORMAL LOW (ref 11.6–15.9)
LYMPH%: 3.8 % — ABNORMAL LOW (ref 14.0–49.7)
MCH: 23.9 pg — ABNORMAL LOW (ref 25.1–34.0)
MCHC: 32.9 g/dL (ref 31.5–36.0)
MCV: 72.7 fL — ABNORMAL LOW (ref 79.5–101.0)
MONO#: 1 10*3/uL — AB (ref 0.1–0.9)
MONO%: 8.1 % (ref 0.0–14.0)
NEUT%: 87.6 % — ABNORMAL HIGH (ref 38.4–76.8)
NEUTROS ABS: 10.5 10*3/uL — AB (ref 1.5–6.5)
NRBC: 0 % (ref 0–0)
PLATELETS: 342 10*3/uL (ref 145–400)
RBC: 4.1 10*6/uL (ref 3.70–5.45)
RDW: 21.9 % — AB (ref 11.2–14.5)
WBC: 11.9 10*3/uL — AB (ref 3.9–10.3)
lymph#: 0.5 10*3/uL — ABNORMAL LOW (ref 0.9–3.3)

## 2015-06-26 MED ORDER — OXYCODONE HCL ER 10 MG PO T12A
10.0000 mg | EXTENDED_RELEASE_TABLET | Freq: Two times a day (BID) | ORAL | Status: DC
  Administered 2015-06-26 – 2015-07-01 (×9): 10 mg via ORAL
  Filled 2015-06-26 (×10): qty 1

## 2015-06-26 MED ORDER — FAMOTIDINE 20 MG PO TABS
20.0000 mg | ORAL_TABLET | Freq: Once | ORAL | Status: AC
  Administered 2015-06-26: 20 mg via ORAL

## 2015-06-26 MED ORDER — PROCHLORPERAZINE EDISYLATE 5 MG/ML IJ SOLN
INTRAMUSCULAR | Status: AC
  Filled 2015-06-26: qty 2

## 2015-06-26 MED ORDER — POLYETHYLENE GLYCOL 3350 17 G PO PACK
17.0000 g | PACK | Freq: Every day | ORAL | Status: DC
  Administered 2015-06-26 – 2015-06-30 (×4): 17 g via ORAL
  Filled 2015-06-26 (×4): qty 1

## 2015-06-26 MED ORDER — QUETIAPINE 12.5 MG HALF TABLET
12.5000 mg | ORAL_TABLET | Freq: Every day | ORAL | Status: DC
  Administered 2015-06-26 – 2015-06-29 (×4): 12.5 mg via ORAL
  Filled 2015-06-26 (×7): qty 1

## 2015-06-26 MED ORDER — SODIUM CHLORIDE 0.9 % IJ SOLN: 3.0000 mL | INTRAMUSCULAR | Status: DC | PRN

## 2015-06-26 MED ORDER — HYDROCORTISONE 2.5 % RE CREA: 1.0000 "application " | TOPICAL_CREAM | Freq: Two times a day (BID) | RECTAL | Status: DC | PRN

## 2015-06-26 MED ORDER — FAMOTIDINE 20 MG PO TABS
ORAL_TABLET | ORAL | Status: AC
  Filled 2015-06-26: qty 1

## 2015-06-26 MED ORDER — NATURA STOMAHESIVE MOLDABLE WAFR: 1.0000 | WAFER | Freq: Two times a day (BID) | Status: DC

## 2015-06-26 MED ORDER — ONDANSETRON 4 MG PO TBDP
4.0000 mg | ORAL_TABLET | Freq: Three times a day (TID) | ORAL | Status: DC | PRN
  Filled 2015-06-26: qty 1

## 2015-06-26 MED ORDER — NYSTATIN 100000 UNIT/ML MT SUSP
5.0000 mL | Freq: Four times a day (QID) | OROMUCOSAL | Status: DC
  Administered 2015-06-26 – 2015-06-30 (×12): 500000 [IU] via ORAL
  Filled 2015-06-26 (×12): qty 5

## 2015-06-26 MED ORDER — SODIUM CHLORIDE 0.9 % IV SOLN: 250.0000 mL | INTRAVENOUS | Status: DC | PRN

## 2015-06-26 MED ORDER — BOOST / RESOURCE BREEZE PO LIQD
1.0000 | Freq: Three times a day (TID) | ORAL | Status: DC
  Administered 2015-06-26 – 2015-06-27 (×3): 1 via ORAL

## 2015-06-26 MED ORDER — SODIUM CHLORIDE 0.9 % IV SOLN
Freq: Once | INTRAVENOUS | Status: AC
  Administered 2015-06-26: 16:00:00 via INTRAVENOUS

## 2015-06-26 MED ORDER — ALUM & MAG HYDROXIDE-SIMETH 200-200-20 MG/5ML PO SUSP: 60.0000 mL | ORAL | Status: DC | PRN

## 2015-06-26 MED ORDER — PANTOPRAZOLE SODIUM 40 MG PO TBEC
40.0000 mg | DELAYED_RELEASE_TABLET | Freq: Every day | ORAL | Status: DC
  Administered 2015-06-26 – 2015-06-30 (×5): 40 mg via ORAL
  Filled 2015-06-26 (×5): qty 1

## 2015-06-26 MED ORDER — NIVOLUMAB CHEMO INJECTION 100 MG/10ML
2.8000 mg/kg | Freq: Once | INTRAVENOUS | Status: DC
  Filled 2015-06-26: qty 12

## 2015-06-26 MED ORDER — SENNOSIDES-DOCUSATE SODIUM 8.6-50 MG PO TABS: 1.0000 | ORAL_TABLET | Freq: Every evening | ORAL | Status: DC | PRN

## 2015-06-26 MED ORDER — VITAMIN B-12 1000 MCG PO TABS
2000.0000 ug | ORAL_TABLET | Freq: Two times a day (BID) | ORAL | Status: DC
  Administered 2015-06-26 – 2015-06-30 (×7): 2000 ug via ORAL
  Filled 2015-06-26 (×13): qty 2

## 2015-06-26 MED ORDER — DEXAMETHASONE 1 MG/ML PO CONC
0.5000 mg | Freq: Two times a day (BID) | ORAL | Status: DC
  Administered 2015-06-26 – 2015-06-30 (×9): 0.5 mg via ORAL
  Filled 2015-06-26 (×14): qty 0.5

## 2015-06-26 MED ORDER — SODIUM CHLORIDE 0.9 % IJ SOLN
3.0000 mL | Freq: Two times a day (BID) | INTRAMUSCULAR | Status: DC
  Administered 2015-06-27 – 2015-06-30 (×8): 3 mL via INTRAVENOUS

## 2015-06-26 MED ORDER — ACETAMINOPHEN 325 MG PO TABS: 650.0000 mg | ORAL_TABLET | ORAL | Status: DC | PRN

## 2015-06-26 MED ORDER — DEXAMETHASONE 0.5 MG/5ML PO SOLN
0.5000 mg | Freq: Two times a day (BID) | ORAL | Status: DC
  Filled 2015-06-26: qty 5

## 2015-06-26 MED ORDER — PROCHLORPERAZINE EDISYLATE 5 MG/ML IJ SOLN
5.0000 mg | Freq: Once | INTRAMUSCULAR | Status: AC
  Administered 2015-06-26: 5 mg via INTRAVENOUS

## 2015-06-26 MED ORDER — SENNOSIDES-DOCUSATE SODIUM 8.6-50 MG PO TABS
2.0000 | ORAL_TABLET | Freq: Two times a day (BID) | ORAL | Status: DC
  Administered 2015-06-26 – 2015-06-30 (×7): 2 via ORAL
  Filled 2015-06-26 (×8): qty 2

## 2015-06-26 MED ORDER — GUAIFENESIN-DM 100-10 MG/5ML PO SYRP: 10.0000 mL | ORAL_SOLUTION | ORAL | Status: DC | PRN

## 2015-06-26 MED ORDER — DIPHENHYDRAMINE HCL 25 MG PO CAPS
ORAL_CAPSULE | ORAL | Status: AC
  Filled 2015-06-26: qty 1

## 2015-06-26 MED ORDER — HYDROMORPHONE HCL 1 MG/ML PO LIQD
0.5000 mg | ORAL | Status: DC | PRN
  Administered 2015-06-30 – 2015-07-01 (×2): 1 mg via ORAL
  Filled 2015-06-26 (×2): qty 1

## 2015-06-26 MED ORDER — DIPHENHYDRAMINE HCL 25 MG PO CAPS
25.0000 mg | ORAL_CAPSULE | Freq: Once | ORAL | Status: AC
  Administered 2015-06-26: 25 mg via ORAL

## 2015-06-26 NOTE — Progress Notes (Signed)
1520: Pt reports Anxiety and has labored breathing when reporting to infusion room. HR 113. Dr. Irene Limbo aware and okay to proceed with treatment. And to give pt Compazine '5mg'$  IV per Dr. Irene Limbo.  1616: Pt starts to become restless and pulling off oxygen tubing and trying to get out of chair. Pt helped to get settled HR 121 and resp 32 and labored, Dr. Irene Limbo over to infusion to assess. Pt is to be admitted to Hospital and chest Xray to be obtained, treatment is to be held at this time. per Dr. Irene Limbo. 1745 Report given to Rebecka Apley  at inpatient [facility]). Pt d/s via wheelchair with staff and daughter to admitting, in stable condition PIV saline locked to Rt FA, receiving nurse aware.

## 2015-06-26 NOTE — Procedures (Signed)
Thoracentesis Procedure Note  Pre-operative Diagnosis: R pleural effusion  Post-operative Diagnosis: same  Indications: Dyspena  Procedure Details  Consent: Informed consent was obtained. Risks of the procedure were discussed including: infection, bleeding, pain, pneumothorax.  Under sterile conditions the patient was positioned. Betadine solution and sterile drapes were utilized.  1% plain lidocaine was used to anesthetize the 7th rib space. Fluid was obtained without any difficulties and minimal blood loss.  A dressing was applied to the wound and wound care instructions were provided.   Findings 1500 ml of clear amber fluid was obtained. A sample was sent to Pathology for cytogenetics, flow, and cell counts, as well as for infection analysis.  Complications:  None; patient tolerated the procedure well.          Condition: stable  Plan A follow up chest x-ray was ordered. Bed Rest for 2 hours.  Georgann Housekeeper, AGACNP-BC [facility] Pulmonology/Critical Care Pager [phone] or [phone]  06/26/2015 11:22 PM

## 2015-06-26 NOTE — H&P (Signed)
[facility] History and Physical  Tessah Patchen WHQ:[id number] DOB: [birth year] DOA: 06/26/2015  Referring physician: Brunetta Genera, MD PCP: [name], DO   Chief Complaint: Shortness of breath and change in mental status.  HPI: Ashley Green is a 81 y.o. 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 Per patient's daughter, she has not had a fever, productive cough, wheezing or rhonchi. However, she has noticed decreased mentation, decreased appetite and an overall deterioration in the past few weeks despite treatment at the cancer center. She states that her mother said that she earlier today when she was more alert that she was tired,ready to die, asked her to stop treatments and let her go to heaven.  Workup today shows a significant right-sided pleural effusion which was accident on imaging about 3 weeks ago. When seen, the patient was anxious and tachypneic and was transferred to the stepdown unit.   Re view of Systems:  Unable to review due to the patient's mental status.  Past Medical History  Diagnosis Date  . Hypertension   . Colon cancer ([place])   . Bowel obstruction ([place])   . GERD (gastroesophageal reflux disease) 10/15/2012  . Adenocarcinoma, lung [facility])    Past Surgical History  Procedure Laterality Date  . Colon surgery    . Colectomy  s/p colon cancer 2000    . Colostomy     Social History:  reports that she has never smoked. She has never used smokeless tobacco. She reports that she does not drink alcohol or use illicit drugs.  No Known Allergies  Family History  Problem Relation Age of Onset  . Hodgkin's lymphoma Son   . Heart attack Mother   . Brain cancer  Sister   . Pneumonia Father   . Alcoholism Brother   . Pneumonia Brother     Prior to Admission medications   Medication Sig Start Date End Date Taking? Authorizing Provider  cyanocobalamin 2000 MCG tablet Take 2,000 mcg by mouth 2 (two) times daily.   Yes Historical Provider, MD  dexamethasone (DECADRON) 0.5 MG/5ML solution Take 5 mLs (0.5 mg total) by mouth 2 (two) times daily. Hold in the mouth and swish in the mouth for as long as possible 06/21/15  Yes Gautam Juleen China, MD  feeding supplement (BOOST / RESOURCE BREEZE) LIQD Take 1 Container by mouth 3 (three) times daily between meals. 06/09/15  Yes Kelvin Cellar, MD  magnesium citrate SOLN Take 0.5 Bottles by mouth daily as needed (constipation).    Yes Historical Provider, MD  mirtazapine (REMERON) 15 MG tablet Take 1 tablet (15 mg total) by mouth at bedtime. 06/12/15  Yes Gautam Juleen China, MD  morphine (ROXANOL) 20 MG/ML concentrated solution Take 0.25 mLs (5 mg total) by mouth every 4 (four) hours as needed for severe pain or breakthrough pain. 06/09/15  Yes Kelvin Cellar, MD  nystatin (MYCOSTATIN) 100000 UNIT/ML suspension Take 5 mLs (500,000 Units total) by mouth 4 (four) times daily. 06/21/15  Yes Brunetta Genera, MD  omeprazole (PRILOSEC) 40 MG capsule Take 1 capsule (40 mg total) by mouth daily. 10/15/12  Yes Dayarmys Pil[place] de [name], MD  ondansetron (ZOFRAN-ODT) 4 MG disintegrating tablet Take 1 tablet (4 mg total) by mouth every 8 (eight) hours as needed for  nausea or vomiting. 04/20/15  Yes [name], MD  Ostomy Supplies (NATURA STOMAHESIVE MOLDABLE) [facility] 1 each by Does not apply route 2 (two) times daily. 02/24/13  Yes Dayarmys [place] de [name], MD  OxyCODONE (OXYCONTIN) 10 mg T12A 12 hr tablet Take 1 tablet (10 mg total) by mouth every 12 (twelve) hours. 06/09/15  Yes Kelvin Cellar, MD  polyethylene glycol (MIRALAX / GLYCOLAX) packet Take 17 g by mouth daily. 06/09/15  Yes Kelvin Cellar, MD  senna-docusate  (SENOKOT-S) 8.6-50 MG per tablet Take 2 tablets by mouth 2 (two) times daily. 06/09/15  Yes Kelvin Cellar, MD  moxifloxacin (AVELOX) 400 MG tablet Take 400 mg by mouth daily at 8 pm. 07/19/15   Historical Provider, MD   Physical Exam: Filed Vitals:   06/26/15 1825  BP: 116/55  Pulse: 110  Temp: 98 F (36.7 C)  TempSrc: Oral  Resp: 20  Height: '4\' 11"'$  (1.499 m)  Weight: 44.453 kg (98 lb)  SpO2: 97%    Wt Readings from Last 3 Encounters:  06/26/15 44.453 kg (98 lb)  06/12/15 44.453 kg (98 lb)  06/07/15 45.813 kg (101 lb)    General:  Appears anxious and is confused. Eyes: PERRL, normal lids, irises & conjunctiva ENT: grossly normal hearing, lips & tongue are very dry Neck: no LAD, masses or thyromegaly Cardiovascular: Tachycardic at 116 bpm, 1+ LE edema. Telemetry: Tachycardic Respiratory: Tachypneic, no breath sounds on right lung field Abdomen: , Colostomy in situ. soft, ntnd, no guarding or rebound tenderness. Skin: no rash or induration seen on limited exam Musculoskeletal: grossly normal tone BUE/BLE Psychiatric: Confused and unable to fully evaluate. Neurologic: Confused and unable to fully evaluate.           Labs on Admission:  Basic Metabolic Panel:  Recent Labs Lab 06/21/15 1524 06/26/15 1440  NA 137 139  K 4.5 4.3  CO2 24 23  GLUCOSE 108 132  BUN 17.1 16.4  CREATININE 0.6 0.6  CALCIUM 8.9 8.6  MG 2.0  --    Liver Function Tests:  Recent Labs Lab 06/21/15 1524 06/26/15 1440  AST 26 20  ALT 18 15  ALKPHOS 119 97  BILITOT 0.36 <0.30  PROT 6.7 5.9*  ALBUMIN 1.9* 1.7*    Recent Labs Lab 06/21/15 1526  LIPASE 30  AMYLASE 25   CBC:  Recent Labs Lab 06/21/15 1524 06/26/15 1440  WBC 13.4* 11.9*  NEUTROABS 12.1* 10.5*  HGB 10.4* 9.8*  HCT 31.3* 29.8*  MCV 73.3* 72.7*  PLT 370 342    Recent Labs  06/06/15 1425  BNP 81.6    Radiological Exams on Admission: Dg Chest 2 View  06/26/2015   CLINICAL DATA:  Metastatic sarcomatoid  lung cancer. Complains of shortness of breath.  EXAM: CHEST  2 VIEW  COMPARISON:  Chest radiograph 06/06/2015.  FINDINGS: There has been interval development of large right pleural effusion. Again seen is right peritracheal upper lobe soft tissue mass. The left lung is clear. No evidence of pneumothorax.  Cardiac silhouette is partially obscured. Atherosclerotic disease of the aorta is seen.  IMPRESSION: Interval development of large right pleural effusion.  Stable to decreased in size right peritracheal upper lobe known lung cancer.   Electronically Signed   By: Fidela Salisbury M.D.   On: 06/26/2015 20:36   Ct Chest Wo Contrast  06/26/2015   CLINICAL DATA:  New onset of breathing difficulties in a patient being treated for a lung cancer with chemotherapy. History of colon cancer  in 2001.  EXAM: CT CHEST WITHOUT CONTRAST  TECHNIQUE: Multidetector CT imaging of the chest was performed following the standard protocol without IV contrast.  COMPARISON:  CT of the chest dated 06/06/2015  FINDINGS: Again seen is a right upper lobe pulmonary mass, with extension to the right mediastinum, and associated mediastinal lymphadenopathy. There has been interval development of large loculated right pleural effusion with severe compressive atelectasis of the remaining of the right lung parenchyma. The mediastinal structures are mildly displaced to the left. There is a small left pleural effusion with subsegmental atelectasis. There is an 8 mm soft tissue nodule in the inferior left lower lobe, in peridiaphragmatic location. Thoracic aorta is normal in caliber. There is a mild pericardial thickening.  Airways are patent.  Visualized upper abdomen demonstrates interval enlargement of bilateral adrenal masses, with the right adrenal mass, which is partially visualized measuring 6.0 x 3.2 cm. Soft tissue nodule is seen within the anterior mesentery, likely representing metastatic nodule. There is also a 1.7 cm subcutaneous soft  tissue nodule in the lateral/anterior abdominal wall, likely also metastatic.  Previously-seen left paraspinal soft tissue masses in the thoracic region, with neural foramina extension have enlarged and are causing lytic destruction of the left-sided pedicles and vertebral bodies of T7 and T9. There are healed to healing left posterior rib fractures.  IMPRESSION: Relatively stable in size right upper lobe pulmonary mass, with interval development of large loculated right pleural effusion and severe compressive atelectasis of the remaining of the right lung parenchyma.  Mild leftward mediastinal shift.  Mild pericardial thickening.  Interval development of small left pleural effusion and an 8 mm. peridiaphragmatic left lower lobe soft tissue nodule.  Interval enlargement of bilateral adrenal masses, with the right adrenal mass, which is incompletely visualized, measuring up to 6 cm.  Anterior mesenteric metastatic nodule.  Right abdominal wall 1.7 cm metastatic nodule.  Interval enlargement of left perispinal soft tissue metastatic masses, with osteolytic changes of the pedicles and left vertebral bodies of T7 and T9.  These results will be called to the ordering clinician or representative by the Radiologist Assistant, and communication documented in the PACS or zVision Dashboard.   Electronically Signed   By: Fidela Salisbury M.D.   On: 06/26/2015 20:34    EKG: Independently reviewed. 06/06/2015  Vent. rate 104 BPM PR interval 124 ms QRS duration 88 ms QT/QTc 318/418 ms P-R-T axes 46 31 47 Sinus tachycardia Unusual P axis, possible ectopic atrial tachycardia Otherwise normal ECG  Assessment/Plan Principal Problem:   Acute on chronic respiratory failure (HCC)   Sarcomatoid carcinoma of lung (HCC)   Dyspnea  Admit to a stepdown for closer monitoring. Continue oxygen supplementation. I have discussed the case with pulmonology on call and a thoracentesis will be performed to ease the patient's  breathing. The patient may need a draining catheter. After discussing the case and findings with the patient's daughter Beryle Flock, the patient stated earlier today that she did not want to continue with treatments, did not wish to be on a respirator or kept alive with artificial means. Patient has been made DNR/DNI at the request of her daughter following the patient's wishes earlier today before she was delirious. . Active Problems:   Hypertension Stable at this time and not using an antihypertensive. Monitor blood pressure.    Hyperlipidemia Given the patient's advanced disease and poor clinical status, there is no need for treatment of hyperlipidemia from a practical standpoint at this time.Marland Kitchen    Dysphagia  Likely due to mass invasion and radiation. Continue current measures.       Anemia in neoplastic disease Monitor H&H as needed.    Hypoalbuminemia Worsening level since previous admission, which is poor indicator on prognosis.  Consult  Dr. Wille Glaser was consulted due to dyspnea and pleural effusion.  Code Status: DNR/DNI. Per daughter [name], the patient stated earlier today that she did not continue with treatments and was "ready to go to heaven". Her daughter agrees with her wishes.  DVT Prophylaxis: SCDs, hold anticoagulants due to thoracentesis. Family Communication:  Lieutenant Diego Daughter [phone]   Disposition Plan: Admit to stepdown for closer monitoring and thoracentesis to be done by the on-call pulmonology team.  Time spent: Over 90 minutes were spent during the process of this admission.  Reubin Milan [facility] Pager [phone].

## 2015-06-26 NOTE — Consult Note (Signed)
Name: Ashley Green MRN: [id number] DOB: 03/27/1928    ADMISSION DATE:  06/26/2015 CONSULTATION DATE:  06/26/2015  REFERRING MD :  TRH  CHIEF COMPLAINT:  SOB  BRIEF PATIENT DESCRIPTION: 81 year old female with metastatic adeno of lung on palliative chemo. Admitted 10/3 for hypertension. SOB noted and CXR shows large R pleural effusion. PCCM consulted for further evaluation.   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   STUDIES:  CT chest 10/3 > stable size R pulmonary mass. Large loculated R pleural effusion with severe compressive atelectasis. Interval development of small left pleural effusion and an 8 mm. peridiaphragmatic left lower lobe soft tissue nodule. Interval enlargement of bilateral adrenal masses, with the right adrenal mass. Right abdominal wall 1.7 cm metastatic nodule. Interval enlargement of left perispinal soft tissue metastatic masses, with osteolytic changes of the pedicles and left vertebral bodies of T7 and T9.  HISTORY OF PRESENT ILLNESS:  81 year old female with history of metastatic adenocarcinoma with metastasis to adrenal glands, pancreas, pelvic lymph nodes, anterior abdominal wall, paraspinal soft tissue. Primary language is spanish, history obtained from daughter and independent telephone translator. She initiated palliative chemo for this 9/2. She gradually improved over the course of the hospitalization and was discharged 9/16. She went so SNF for acute rehab. She has been having SOB for some time. She was seen by her oncologist Dr. Irene Limbo 9/28, SOB was thought to be due to mass as some airway compression had been noted on prior imaging. He ordered a CXR for 10/3 to better evaluate this which showed large R effusion and stable size of R pleural mass. 10/3 she presented to [facility] from cancer center with cc SOB. CXR was reviewed. She was admitted to the hospitalist team. PCCM was consulted for further evaluation and possible  thoracentesis for therapeutic purposes. History was obtained through the patient's daughter after discussion through translator service confirmed patient's underlying delirium. Daughter reports a cough that has been nonproductive and mild. Her major symptom has been increasing dyspnea. She was treated empirically with antibiotics for "pneumonia" at her nursing facility. The patient has also had fever and chills as well.  PAST MEDICAL HISTORY :   has a past medical history of Hypertension; Colon cancer ([place]); Bowel obstruction ([place]); GERD (gastroesophageal reflux disease) (10/15/2012); and Adenocarcinoma, lung ([place]).  has past surgical history that includes Colon surgery; colectomy  s/p colon cancer 2000; and Colostomy. Prior to Admission medications   Medication Sig Start Date End Date Taking? Authorizing Provider  cyanocobalamin 2000 MCG tablet Take 2,000 mcg by mouth 2 (two) times daily.   Yes Historical Provider, MD  dexamethasone (DECADRON) 0.5 MG/5ML solution Take 5 mLs (0.5 mg total) by mouth 2 (two) times daily. Hold in the mouth and swish in the mouth for as long as possible 06/21/15  Yes Gautam Juleen China, MD  feeding supplement (BOOST / RESOURCE BREEZE) LIQD Take 1 Container by mouth 3 (three) times daily between meals. 06/09/15  Yes Kelvin Cellar, MD  magnesium citrate SOLN Take 0.5 Bottles by mouth daily as needed (constipation).    Yes Historical Provider, MD  mirtazapine (REMERON) 15 MG tablet Take 1 tablet (15 mg total) by mouth at bedtime. 06/12/15  Yes Gautam Juleen China, MD  morphine (ROXANOL) 20 MG/ML concentrated solution Take 0.25 mLs (5 mg total) by mouth every 4 (four) hours as needed for severe pain or breakthrough pain. 06/09/15  Yes Kelvin Cellar, MD  nystatin (MYCOSTATIN) 100000  UNIT/ML suspension Take 5 mLs (500,000 Units total) by mouth 4 (four) times daily. 06/21/15  Yes Brunetta Genera, MD  omeprazole (PRILOSEC) 40 MG capsule Take 1 capsule (40 mg total) by mouth  daily. 10/15/12  Yes Dayarmys Pil[place] de [name], MD  ondansetron (ZOFRAN-ODT) 4 MG disintegrating tablet Take 1 tablet (4 mg total) by mouth every 8 (eight) hours as needed for nausea or vomiting. 04/20/15  Yes [name], MD  Ostomy Supplies (NATURA STOMAHESIVE MOLDABLE) [facility] 1 each by Does not apply route 2 (two) times daily. 02/24/13  Yes Dayarmys [place] de [name], MD  OxyCODONE (OXYCONTIN) 10 mg T12A 12 hr tablet Take 1 tablet (10 mg total) by mouth every 12 (twelve) hours. 06/09/15  Yes Kelvin Cellar, MD  polyethylene glycol (MIRALAX / GLYCOLAX) packet Take 17 g by mouth daily. 06/09/15  Yes Kelvin Cellar, MD  senna-docusate (SENOKOT-S) 8.6-50 MG per tablet Take 2 tablets by mouth 2 (two) times daily. 06/09/15  Yes Kelvin Cellar, MD  moxifloxacin (AVELOX) 400 MG tablet Take 400 mg by mouth daily at 8 pm. 07/19/15   Historical Provider, MD   No Known Allergies  FAMILY HISTORY:  family history includes Alcoholism in her brother; Brain cancer in her sister; Heart attack in her mother; Hodgkin's lymphoma in her son; Pneumonia in her brother and father. SOCIAL HISTORY:  reports that she has never smoked. She has never used smokeless tobacco. She reports that she does not drink alcohol or use illicit drugs.  REVIEW OF SYSTEMS:  An accurate review of systems unobtainable as the patient is delirious.  SUBJECTIVE:   VITAL SIGNS: Temp:  [97.8 F (36.6 C)-98 F (36.7 C)] 98 F (36.7 C) (10/03 1825) Pulse Rate:  [110-120] 110 (10/03 1825) Resp:  [20-30] 20 (10/03 1825) BP: (116-117)/(48-55) 116/55 mmHg (10/03 1825) SpO2:  [97 %] 97 % (10/03 1825) Weight:  [44.453 kg (98 lb)] 44.453 kg (98 lb) (10/03 1825)  PHYSICAL EXAMINATION: General:  Elderly, cachectic female in mild resp distress Neuro: Alert. Not oriented to place or time as evidenced through translator service. Moving all 4 extremities equally. HEENT:  [place]/AT, no JVD, PERRL Cardiovascular:  RRR, no MRG Lungs:  Decreased R breath  sounds. Good aeration of the left lung. Moderately increased work of breathing on room air and supplemental oxygen. Abdomen:  Soft, non-tender, non-distended Musculoskeletal:  No acute deformity Skin:  Grossly intact   Recent Labs Lab 06/21/15 1524 06/26/15 1440  NA 137 139  K 4.5 4.3  CO2 24 23  BUN 17.1 16.4  CREATININE 0.6 0.6  GLUCOSE 108 132    Recent Labs Lab 06/21/15 1524 06/26/15 1440  HGB 10.4* 9.8*  HCT 31.3* 29.8*  WBC 13.4* 11.9*  PLT 370 342   Dg Chest 2 View  06/26/2015   CLINICAL DATA:  Metastatic sarcomatoid lung cancer. Complains of shortness of breath.  EXAM: CHEST  2 VIEW  COMPARISON:  Chest radiograph 06/06/2015.  FINDINGS: There has been interval development of large right pleural effusion. Again seen is right peritracheal upper lobe soft tissue mass. The left lung is clear. No evidence of pneumothorax.  Cardiac silhouette is partially obscured. Atherosclerotic disease of the aorta is seen.  IMPRESSION: Interval development of large right pleural effusion.  Stable to decreased in size right peritracheal upper lobe known lung cancer.   Electronically Signed   By: Fidela Salisbury M.D.   On: 06/26/2015 20:36   Ct Chest Wo Contrast  06/26/2015   CLINICAL DATA:  New onset of breathing difficulties in a patient being treated for a lung cancer with chemotherapy. History of colon cancer in 2001.  EXAM: CT CHEST WITHOUT CONTRAST  TECHNIQUE: Multidetector CT imaging of the chest was performed following the standard protocol without IV contrast.  COMPARISON:  CT of the chest dated 06/06/2015  FINDINGS: Again seen is a right upper lobe pulmonary mass, with extension to the right mediastinum, and associated mediastinal lymphadenopathy. There has been interval development of large loculated right pleural effusion with severe compressive atelectasis of the remaining of the right lung parenchyma. The mediastinal structures are mildly displaced to the left. There is a small  left pleural effusion with subsegmental atelectasis. There is an 8 mm soft tissue nodule in the inferior left lower lobe, in peridiaphragmatic location. Thoracic aorta is normal in caliber. There is a mild pericardial thickening.  Airways are patent.  Visualized upper abdomen demonstrates interval enlargement of bilateral adrenal masses, with the right adrenal mass, which is partially visualized measuring 6.0 x 3.2 cm. Soft tissue nodule is seen within the anterior mesentery, likely representing metastatic nodule. There is also a 1.7 cm subcutaneous soft tissue nodule in the lateral/anterior abdominal wall, likely also metastatic.  Previously-seen left paraspinal soft tissue masses in the thoracic region, with neural foramina extension have enlarged and are causing lytic destruction of the left-sided pedicles and vertebral bodies of T7 and T9. There are healed to healing left posterior rib fractures.  IMPRESSION: Relatively stable in size right upper lobe pulmonary mass, with interval development of large loculated right pleural effusion and severe compressive atelectasis of the remaining of the right lung parenchyma.  Mild leftward mediastinal shift.  Mild pericardial thickening.  Interval development of small left pleural effusion and an 8 mm. peridiaphragmatic left lower lobe soft tissue nodule.  Interval enlargement of bilateral adrenal masses, with the right adrenal mass, which is incompletely visualized, measuring up to 6 cm.  Anterior mesenteric metastatic nodule.  Right abdominal wall 1.7 cm metastatic nodule.  Interval enlargement of left perispinal soft tissue metastatic masses, with osteolytic changes of the pedicles and left vertebral bodies of T7 and T9.  These results will be called to the ordering clinician or representative by the Radiologist Assistant, and communication documented in the PACS or zVision Dashboard.   Electronically Signed   By: Fidela Salisbury M.D.   On: 06/26/2015 20:34     ASSESSMENT / PLAN:  Large R pleural effusion > likely secondary to R adenocarcinoma of lung Acute hypoxemic respiratory failure in setting of effusion - Supplemental O2 as needed to keep SpO2 > 90% - Will evaluate for bedside thoracentesis - Send pleural fluid for studies - CXR post-procedure  Metastatic adenocarcinoma of lung with multiple sites of metastases including adrenal glands, pancreas.   - management per primary team/oncology  HTN > currently stable - management per primary team  GERD - per primary  Georgann Housekeeper, AGACNP-BC [facility] Pulmonology/Critical Care Pager [phone] or [phone]  06/26/2015 10:34 PM  PCCM Attending Note: Patient seen and examined with nurse practitioner. Please refer to his consultation note. Patient with known metastatic sarcoma. Currently undergoing palliative chemotherapy & radiation therapy. Progressively worsening dyspnea as well as acute hypoxic respiratory failure. Attempted to consider the patient for thoracentesis at bedside this evening but given the patient's delirium we were unsuccessful. Patient's daughter is at bedside and has consented to the procedure given her tenuous pulmonary status and delirium. I explained the risks of the procedure to the patient's daughter.  Currently the patient is DO NOT RESUSCITATE/DO NOT INTUBATE. I suspect this pleural effusion is likely malignant in etiology and a poor prognostic indicator.  Sonia Baller Ashok Cordia, M.D. [place] Pager:  [phone] After 3pm or if no response, call [phone]

## 2015-06-26 NOTE — Progress Notes (Signed)
Oncology Short Note  Ms Harpham has metastatic sarcomatoid lung cancer was here for 3rd cycle of Nivolumab. Apparently hasd some rt sided pneumonia was treated with Avelox at her SNF. Today noted to be more restless and short of breath. Saturating 100% on RA, mildly tachypneic and with tachycardia to 117/min.  OE - decreased breath sounds rt chest about 1/2 up chest wall concerning for pleural effusion.  Called and talked with Dr Charlies Silvers- hospitalist. Patient to be admitted to step down unit under hospitalist service for evaluation of increased shortness of breath/WOB and delirium  ?pneumonia ?pleural effusion PLan -held Nivolumab today -admitted to Hospital on step down unit under hospitalist service -admitting orders and CT chest/CXR ordered stat to help facilitate rapid assessment. -speech/swallow evaluation. -if large pleural effusion might need therapeutic + diagnostic paracentesis. -consider pulmonary/CCM consult if needed. -pain management -seroquel low dose HS -further management per hospitalist team. -will continue to follow -please call if questions  Sullivan Lone MD MS

## 2015-06-27 ENCOUNTER — Other Ambulatory Visit: Payer: Self-pay

## 2015-06-27 DIAGNOSIS — R0600 Dyspnea, unspecified: Secondary | ICD-10-CM

## 2015-06-27 DIAGNOSIS — E43 Unspecified severe protein-calorie malnutrition: Secondary | ICD-10-CM | POA: Diagnosis present

## 2015-06-27 DIAGNOSIS — L8990 Pressure ulcer of unspecified site, unspecified stage: Secondary | ICD-10-CM | POA: Diagnosis present

## 2015-06-27 LAB — COMPREHENSIVE METABOLIC PANEL
ALT: 15 U/L (ref 14–54)
AST: 28 U/L (ref 15–41)
Albumin: 1.8 g/dL — ABNORMAL LOW (ref 3.5–5.0)
Alkaline Phosphatase: 78 U/L (ref 38–126)
Anion gap: 12 (ref 5–15)
BUN: 18 mg/dL (ref 6–20)
CALCIUM: 7.9 mg/dL — AB (ref 8.9–10.3)
CHLORIDE: 102 mmol/L (ref 101–111)
CO2: 24 mmol/L (ref 22–32)
CREATININE: 0.49 mg/dL (ref 0.44–1.00)
Glucose, Bld: 111 mg/dL — ABNORMAL HIGH (ref 65–99)
Potassium: 4.3 mmol/L (ref 3.5–5.1)
Sodium: 138 mmol/L (ref 135–145)
TOTAL PROTEIN: 5.6 g/dL — AB (ref 6.5–8.1)
Total Bilirubin: 0.3 mg/dL (ref 0.3–1.2)

## 2015-06-27 LAB — BODY FLUID CELL COUNT WITH DIFFERENTIAL
Lymphs, Fluid: 54 %
Monocyte-Macrophage-Serous Fluid: 42 % — ABNORMAL LOW (ref 50–90)
Neutrophil Count, Fluid: 4 % (ref 0–25)
Total Nucleated Cell Count, Fluid: 397 uL (ref 0–1000)

## 2015-06-27 LAB — LACTATE DEHYDROGENASE: LDH: 191 U/L (ref 98–192)

## 2015-06-27 LAB — LACTATE DEHYDROGENASE, PLEURAL OR PERITONEAL FLUID: LD, Fluid: 286 U/L — ABNORMAL HIGH (ref 3–23)

## 2015-06-27 LAB — CBC WITH DIFFERENTIAL/PLATELET
BASOS ABS: 0 10*3/uL (ref 0.0–0.1)
Basophils Relative: 0 %
EOS ABS: 0 10*3/uL (ref 0.0–0.7)
EOS PCT: 0 %
HCT: 25.6 % — ABNORMAL LOW (ref 36.0–46.0)
Hemoglobin: 8.6 g/dL — ABNORMAL LOW (ref 12.0–15.0)
Lymphocytes Relative: 5 %
Lymphs Abs: 0.5 10*3/uL — ABNORMAL LOW (ref 0.7–4.0)
MCH: 24.9 pg — ABNORMAL LOW (ref 26.0–34.0)
MCHC: 33.6 g/dL (ref 30.0–36.0)
MCV: 74 fL — ABNORMAL LOW (ref 78.0–100.0)
MONO ABS: 0.6 10*3/uL (ref 0.1–1.0)
Monocytes Relative: 6 %
NEUTROS PCT: 89 %
Neutro Abs: 9.6 10*3/uL — ABNORMAL HIGH (ref 1.7–7.7)
PLATELETS: 344 10*3/uL (ref 150–400)
RBC: 3.46 MIL/uL — AB (ref 3.87–5.11)
RDW: 22.4 % — ABNORMAL HIGH (ref 11.5–15.5)
WBC: 10.7 10*3/uL — AB (ref 4.0–10.5)

## 2015-06-27 LAB — URINE MICROSCOPIC-ADD ON

## 2015-06-27 LAB — URINALYSIS, ROUTINE W REFLEX MICROSCOPIC
BILIRUBIN URINE: NEGATIVE
Glucose, UA: NEGATIVE mg/dL
KETONES UR: NEGATIVE mg/dL
NITRITE: NEGATIVE
Protein, ur: NEGATIVE mg/dL
Specific Gravity, Urine: 1.018 (ref 1.005–1.030)
Urobilinogen, UA: 0.2 mg/dL (ref 0.0–1.0)
pH: 6 (ref 5.0–8.0)

## 2015-06-27 LAB — PROTEIN, BODY FLUID: Total protein, fluid: <3 g/dL

## 2015-06-27 LAB — CHOLESTEROL, TOTAL: Cholesterol: 89 mg/dL (ref 0–200)

## 2015-06-27 LAB — PROTEIN, TOTAL: Total Protein: 5.9 g/dL — ABNORMAL LOW (ref 6.5–8.1)

## 2015-06-27 LAB — MRSA PCR SCREENING: MRSA BY PCR: NEGATIVE

## 2015-06-27 MED ORDER — ACETAMINOPHEN 10 MG/ML IV SOLN
500.0000 mg | Freq: Once | INTRAVENOUS | Status: AC
  Administered 2015-06-27: 500 mg via INTRAVENOUS
  Filled 2015-06-27: qty 50

## 2015-06-27 MED ORDER — CETYLPYRIDINIUM CHLORIDE 0.05 % MT LIQD
7.0000 mL | Freq: Two times a day (BID) | OROMUCOSAL | Status: DC
  Administered 2015-06-27 – 2015-06-30 (×6): 7 mL via OROMUCOSAL

## 2015-06-27 MED ORDER — CHLORHEXIDINE GLUCONATE 0.12 % MT SOLN
15.0000 mL | Freq: Two times a day (BID) | OROMUCOSAL | Status: DC
  Administered 2015-06-27 – 2015-06-30 (×6): 15 mL via OROMUCOSAL
  Filled 2015-06-27 (×7): qty 15

## 2015-06-27 NOTE — Progress Notes (Signed)
Name: Ashley Green MRN: [id number] DOB: 31-Oct-1927    ADMISSION DATE:  06/26/2015 CONSULTATION DATE:  06/26/2015  REFERRING MD :  TRH  CHIEF COMPLAINT:  SOB  BRIEF PATIENT DESCRIPTION: 81 year old female with metastatic adeno of lung on palliative chemo. Admitted 10/3 for hypertension. SOB noted and CXR shows large R pleural effusion. PCCM consulted for further evaluation.   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   STUDIES:  CT chest 10/3 > stable size R pulmonary mass. Large loculated R pleural effusion with severe compressive atelectasis. Interval development of small left pleural effusion and an 8 mm. peridiaphragmatic left lower lobe soft tissue nodule. Interval enlargement of bilateral adrenal masses, with the right adrenal mass. Right abdominal wall 1.7 cm metastatic nodule. Interval enlargement of left perispinal soft tissue metastatic masses, with osteolytic changes of the pedicles and left vertebral bodies of T7 and T9.  HISTORY OF PRESENT ILLNESS:  81 year old female with history of metastatic adenocarcinoma with metastasis to adrenal glands, pancreas, pelvic lymph nodes, anterior abdominal wall, paraspinal soft tissue. Primary language is spanish, history obtained from daughter and independent telephone translator. She initiated palliative chemo for this 9/2. She gradually improved over the course of the hospitalization and was discharged 9/16. She went so SNF for acute rehab. She has been having SOB for some time. She was seen by her oncologist Dr. Irene Limbo 9/28, SOB was thought to be due to mass as some airway compression had been noted on prior imaging. He ordered a CXR for 10/3 to better evaluate this which showed large R effusion and stable size of R pleural mass. 10/3 she presented to [facility] from cancer center with cc SOB. CXR was reviewed. She was admitted to the hospitalist team. PCCM was consulted for further evaluation and possible  thoracentesis for therapeutic purposes. History was obtained through the patient's daughter after discussion through translator service confirmed patient's underlying delirium. Daughter reports a cough that has been nonproductive and mild. Her major symptom has been increasing dyspnea. She was treated empirically with antibiotics for "pneumonia" at her nursing facility. The patient has also had fever and chills as well.    SUBJECTIVE:  NAD this am  VITAL SIGNS: Temp:  [97.8 F (36.6 C)-99.9 F (37.7 C)] 99 F (37.2 C) (10/04 0340) Pulse Rate:  [101-173] 101 (10/04 0800) Resp:  [20-38] 30 (10/04 0800) BP: (95-117)/(38-55) 96/39 mmHg (10/04 0800) SpO2:  [90 %-99 %] 92 % (10/04 0800) Weight:  [98 lb (44.453 kg)] 98 lb (44.453 kg) (10/03 1825)  PHYSICAL EXAMINATION: General:  Elderly, cachectic female in NAD Neuro: Alert.NO english, appears intact HEENT:  [place]/AT, no JVD, PERRL Cardiovascular:  RRR, no MRG Lungs:  Decreased R breath sounds at basae. Good aeration of the left lung. Moderately increased work of breathing on room air and supplemental oxygen. Abdomen:  Soft, non-tender, non-distended Musculoskeletal:  No acute deformity Skin:  Grossly intact   Recent Labs Lab 06/21/15 1524 06/26/15 1440 06/27/15 0340  NA 137 139 138  K 4.5 4.3 4.3  CL  --   --  102  CO2 '24 23 24  '$ BUN 17.1 16.4 18  CREATININE 0.6 0.6 0.49  GLUCOSE 108 132 111*    Recent Labs Lab 06/21/15 1524 06/26/15 1440 06/27/15 0340  HGB 10.4* 9.8* 8.6*  HCT 31.3* 29.8* 25.6*  WBC 13.4* 11.9* 10.7*  PLT 370 342 344   Dg Chest 2 View  06/26/2015   CLINICAL DATA:  Metastatic sarcomatoid lung cancer. Complains of shortness of breath.  EXAM: CHEST  2 VIEW  COMPARISON:  Chest radiograph 06/06/2015.  FINDINGS: There has been interval development of large right pleural effusion. Again seen is right peritracheal upper lobe soft tissue mass. The left lung is clear. No evidence of pneumothorax.  Cardiac  silhouette is partially obscured. Atherosclerotic disease of the aorta is seen.  IMPRESSION: Interval development of large right pleural effusion.  Stable to decreased in size right peritracheal upper lobe known lung cancer.   Electronically Signed   By: Fidela Salisbury M.D.   On: 06/26/2015 20:36   Ct Chest Wo Contrast  06/26/2015   CLINICAL DATA:  New onset of breathing difficulties in a patient being treated for a lung cancer with chemotherapy. History of colon cancer in 2001.  EXAM: CT CHEST WITHOUT CONTRAST  TECHNIQUE: Multidetector CT imaging of the chest was performed following the standard protocol without IV contrast.  COMPARISON:  CT of the chest dated 06/06/2015  FINDINGS: Again seen is a right upper lobe pulmonary mass, with extension to the right mediastinum, and associated mediastinal lymphadenopathy. There has been interval development of large loculated right pleural effusion with severe compressive atelectasis of the remaining of the right lung parenchyma. The mediastinal structures are mildly displaced to the left. There is a small left pleural effusion with subsegmental atelectasis. There is an 8 mm soft tissue nodule in the inferior left lower lobe, in peridiaphragmatic location. Thoracic aorta is normal in caliber. There is a mild pericardial thickening.  Airways are patent.  Visualized upper abdomen demonstrates interval enlargement of bilateral adrenal masses, with the right adrenal mass, which is partially visualized measuring 6.0 x 3.2 cm. Soft tissue nodule is seen within the anterior mesentery, likely representing metastatic nodule. There is also a 1.7 cm subcutaneous soft tissue nodule in the lateral/anterior abdominal wall, likely also metastatic.  Previously-seen left paraspinal soft tissue masses in the thoracic region, with neural foramina extension have enlarged and are causing lytic destruction of the left-sided pedicles and vertebral bodies of T7 and T9. There are healed to  healing left posterior rib fractures.  IMPRESSION: Relatively stable in size right upper lobe pulmonary mass, with interval development of large loculated right pleural effusion and severe compressive atelectasis of the remaining of the right lung parenchyma.  Mild leftward mediastinal shift.  Mild pericardial thickening.  Interval development of small left pleural effusion and an 8 mm. peridiaphragmatic left lower lobe soft tissue nodule.  Interval enlargement of bilateral adrenal masses, with the right adrenal mass, which is incompletely visualized, measuring up to 6 cm.  Anterior mesenteric metastatic nodule.  Right abdominal wall 1.7 cm metastatic nodule.  Interval enlargement of left perispinal soft tissue metastatic masses, with osteolytic changes of the pedicles and left vertebral bodies of T7 and T9.  These results will be called to the ordering clinician or representative by the Radiologist Assistant, and communication documented in the PACS or zVision Dashboard.   Electronically Signed   By: Fidela Salisbury M.D.   On: 06/26/2015 20:34   Dg Chest Port 1 View  06/27/2015   CLINICAL DATA:  Status post thoracentesis.  Initial encounter.  EXAM: PORTABLE CHEST 1 VIEW  COMPARISON:  Chest radiograph and CT of the chest performed earlier today at 8:01 p.m.  FINDINGS: Status post thoracentesis, a residual small right pleural effusion is noted, with underlying right-sided airspace opacification. This may reflect asymmetric pulmonary edema or pneumonia. The known right upper lobe mass is  again noted. The left lung appears relatively clear. No pneumothorax is seen.  The cardiomediastinal silhouette is borderline enlarged. No acute osseous abnormalities are identified.  IMPRESSION: Residual small right pleural effusion noted status post thoracentesis, with underlying right-sided airspace opacification. This may reflect asymmetric pulmonary edema or pneumonia. The patient's right upper lobe lung mass is again  noted.   Electronically Signed   By: Garald Balding M.D.   On: 06/27/2015 00:11    ASSESSMENT / PLAN:  Large R pleural effusion > likely secondary to R adenocarcinoma of lung Acute hypoxemic respiratory failure in setting of effusion - Thoracentesis 10/3 1500 for palliation. -follow c x r  Metastatic adenocarcinoma of lung with multiple sites of metastases including adrenal glands, pancreas.   - management per primary team/oncology  HTN > currently stable - management per primary team  GERD - per primary  Summary: 81 yo Hispanic female with metastatic lung adenocarcinoma and undergoing palliative Chemo/Rtx therapy and admitted per [facility] 10/3 with hypoxia. PCCM tapped 1500 cc of presumed malignant effusion off of right lung 10/3. 10/4 NAD post tap and she is a DNR.  Richardson Landry Renardo Cheatum ACNP Maryanna Shape PCCM Pager [phone] till 3 pm If no answer page [phone] 06/27/2015, 8:38 AM

## 2015-06-27 NOTE — Progress Notes (Signed)
Pt tachycardic in 170s. EKG shows Sinus Tach. Pt complaining of being hot and sweating. Oral temp 98.0 although pt feels much warmer than this. Rectal temp not able to be take as patient doesn't have rectum from colectomy. Baltazar Najjar, NP notified and IV Tylenol ordered.

## 2015-06-27 NOTE — Progress Notes (Signed)
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 patient's daughter Beryle Flock said the patient stated earlier today that she did not want to continue with treatments, did not wish to be on a respirator or kept alive with artificial means .  Hypertension Stable at this time and not using an antihypertensive. Monitor blood pressure.   Hyperlipidemia Given the patient's advanced disease and poor clinical status, there is no need for treatment of hyperlipidemia from a practical standpoint at this time.Marland Kitchen   Dysphagia Likely due to mass invasion and radiation. Continue current measures.    Anemia in neoplastic disease Monitor H&H as needed.   Hypoalbuminemia Worsening level since previous admission, which is poor indicator on prognosis.  palliative care consult   Code Status: dnr Family Communication: daughter on phone 10/4 Disposition Plan:    Consultants:  pulm- signed off  Procedures:      HPI/Subjective: resting  Objective: Filed Vitals:   06/27/15 1122  BP: 91/45  Pulse: 92  Temp:   Resp: 28    Intake/Output Summary (Last 24 hours) at 06/27/15 1131 Last data filed at 06/27/15 0600  Gross per 24 hour  Intake     45 ml  Output      0 ml  Net     45 ml   Filed Weights   06/26/15 1825  Weight: 44.453 kg (98 lb)    Exam:   General:  Awake, NAD- thin  Cardiovascular: rrr  Respiratory: decreased  Abdomen: +BS, soft  Musculoskeletal: no edema   Data Reviewed: Basic Metabolic Panel:  Recent Labs Lab 06/21/15 1524 06/26/15 1440 06/27/15 0340  NA 137 139 138  K 4.5 4.3 4.3  CL  --   --  102  CO2 '24 23 24  '$ GLUCOSE 108 132 111*  BUN 17.1 16.4 18  CREATININE 0.6 0.6 0.49  CALCIUM 8.9 8.6 7.9*    MG 2.0  --   --    Liver Function Tests:  Recent Labs Lab 06/21/15 1524 06/26/15 1440 06/26/15 2358 06/27/15 0340  AST 26 20  --  28  ALT 18 15  --  15  ALKPHOS 119 97  --  78  BILITOT 0.36 <0.30  --  0.3  PROT 6.7 5.9* 5.9* 5.6*  ALBUMIN 1.9* 1.7*  --  1.8*    Recent Labs Lab 06/21/15 1526  LIPASE 30  AMYLASE 25   No results for input(s): AMMONIA in the last 168 hours. CBC:  Recent Labs Lab 06/21/15 1524 06/26/15 1440 06/27/15 0340  WBC 13.4* 11.9* 10.7*  NEUTROABS 12.1* 10.5* 9.6*  HGB 10.4* 9.8* 8.6*  HCT 31.3* 29.8* 25.6*  MCV 73.3* 72.7* 74.0*  PLT 370 342 344   Cardiac Enzymes: No results for input(s): CKTOTAL, CKMB, CKMBINDEX, TROPONINI in the last 168 hours. BNP (last 3 results)  Recent Labs  06/06/15 1425  BNP 81.6    ProBNP (last 3 results) No results for input(s): PROBNP in the last 8760 hours.  CBG: No results for input(s): GLUCAP in the last 168 hours.  Recent Results (from the past 240 hour(s))  Body fluid culture     Status: None (Preliminary result)   Collection Time: 06/26/15 11:54 PM  Result Value Ref Range Status   Specimen Description FLUID PLEURAL RIGHT  Final   Special Requests NONE  Final   Gram Stain   Final    FEW WBC PRESENT, PREDOMINANTLY MONONUCLEAR NO ORGANISMS SEEN Performed at [facility]    Culture PENDING  Incomplete   Report Status PENDING  Incomplete  MRSA PCR Screening     Status: None   Collection Time: 06/27/15  3:16 AM  Result Value Ref Range Status   MRSA by PCR NEGATIVE NEGATIVE Final    Comment:        The GeneXpert MRSA Assay (FDA approved for NASAL specimens only), is one component of a comprehensive MRSA colonization surveillance program. It is not intended to diagnose MRSA infection nor to guide or monitor treatment for MRSA infections.      Studies: Dg Chest 2 View  06/26/2015   CLINICAL DATA:  Metastatic sarcomatoid lung cancer. Complains of shortness of breath.  EXAM:  CHEST  2 VIEW  COMPARISON:  Chest radiograph 06/06/2015.  FINDINGS: There has been interval development of large right pleural effusion. Again seen is right peritracheal upper lobe soft tissue mass. The left lung is clear. No evidence of pneumothorax.  Cardiac silhouette is partially obscured. Atherosclerotic disease of the aorta is seen.  IMPRESSION: Interval development of large right pleural effusion.  Stable to decreased in size right peritracheal upper lobe known lung cancer.   Electronically Signed   By: Fidela Salisbury M.D.   On: 06/26/2015 20:36   Ct Chest Wo Contrast  06/26/2015   CLINICAL DATA:  New onset of breathing difficulties in a patient being treated for a lung cancer with chemotherapy. History of colon cancer in 2001.  EXAM: CT CHEST WITHOUT CONTRAST  TECHNIQUE: Multidetector CT imaging of the chest was performed following the standard protocol without IV contrast.  COMPARISON:  CT of the chest dated 06/06/2015  FINDINGS: Again seen is a right upper lobe pulmonary mass, with extension to the right mediastinum, and associated mediastinal lymphadenopathy. There has been interval development of large loculated right pleural effusion with severe compressive atelectasis of the remaining of the right lung parenchyma. The mediastinal structures are mildly displaced to the left. There is a small left pleural effusion with subsegmental atelectasis. There is an 8 mm soft tissue nodule in the inferior left lower lobe, in peridiaphragmatic location. Thoracic aorta is normal in caliber. There is a mild pericardial thickening.  Airways are patent.  Visualized upper abdomen demonstrates interval enlargement of bilateral adrenal masses, with the right adrenal mass, which is partially visualized measuring 6.0 x 3.2 cm. Soft tissue nodule is seen within the anterior mesentery, likely representing metastatic nodule. There is also a 1.7 cm subcutaneous soft tissue nodule in the lateral/anterior abdominal wall,  likely also metastatic.  Previously-seen left paraspinal soft tissue masses in the thoracic region, with neural foramina extension have enlarged and are causing lytic destruction of the left-sided pedicles and vertebral bodies of T7 and T9. There are healed to healing left posterior rib fractures.  IMPRESSION: Relatively stable in size right upper lobe pulmonary mass, with interval development of large loculated right pleural effusion and severe compressive atelectasis of the remaining of the right lung parenchyma.  Mild leftward mediastinal shift.  Mild pericardial thickening.  Interval development of small left pleural effusion and an 8 mm. peridiaphragmatic left lower lobe soft tissue nodule.  Interval enlargement of bilateral adrenal masses, with the right adrenal mass, which is incompletely visualized, measuring up to 6 cm.  Anterior mesenteric metastatic nodule.  Right abdominal wall 1.7 cm metastatic nodule.  Interval enlargement of left  perispinal soft tissue metastatic masses, with osteolytic changes of the pedicles and left vertebral bodies of T7 and T9.  These results will be called to the ordering clinician or representative by the Radiologist Assistant, and communication documented in the PACS or zVision Dashboard.   Electronically Signed   By: Fidela Salisbury M.D.   On: 06/26/2015 20:34   Dg Chest Port 1 View  06/27/2015   CLINICAL DATA:  Status post thoracentesis.  Initial encounter.  EXAM: PORTABLE CHEST 1 VIEW  COMPARISON:  Chest radiograph and CT of the chest performed earlier today at 8:01 p.m.  FINDINGS: Status post thoracentesis, a residual small right pleural effusion is noted, with underlying right-sided airspace opacification. This may reflect asymmetric pulmonary edema or pneumonia. The known right upper lobe mass is again noted. The left lung appears relatively clear. No pneumothorax is seen.  The cardiomediastinal silhouette is borderline enlarged. No acute osseous abnormalities are  identified.  IMPRESSION: Residual small right pleural effusion noted status post thoracentesis, with underlying right-sided airspace opacification. This may reflect asymmetric pulmonary edema or pneumonia. The patient's right upper lobe lung mass is again noted.   Electronically Signed   By: Garald Balding M.D.   On: 06/27/2015 00:11    Scheduled Meds: . antiseptic oral rinse  7 mL Mouth Rinse q12n4p  . chlorhexidine  15 mL Mouth Rinse BID  . dexamethasone  0.5 mg Oral BID  . feeding supplement  1 Container Oral TID BM  . nystatin  5 mL Oral QID  . OxyCODONE  10 mg Oral Q12H  . pantoprazole  40 mg Oral Daily  . polyethylene glycol  17 g Oral Daily  . QUEtiapine  12.5 mg Oral QHS  . senna-docusate  2 tablet Oral BID  . sodium chloride  3 mL Intravenous Q12H  . cyanocobalamin  2,000 mcg Oral BID   Continuous Infusions:  Antibiotics Given (last 72 hours)    None      Principal Problem:   Acute on chronic respiratory failure (HCC) Active Problems:   Hypertension   Hyperlipidemia   Dysphagia   Sarcomatoid carcinoma of lung (HCC)   Dyspnea   Anemia in neoplastic disease   Hypoalbuminemia   Acute respiratory failure with hypoxia (HCC)   Pleural effusion   Pressure ulcer   Protein-calorie malnutrition, severe    Time spent: 35 min    VANN, JESSICA  [facility] Pager [phone]. If 7PM-7AM, please contact night-coverage at [email or web address], password [facility] 06/27/2015, 11:31 AM  LOS: 1 day

## 2015-06-27 NOTE — Evaluation (Signed)
Clinical/Bedside Swallow Evaluation Patient Details  Name: Ashley Green MRN: [id number] Date of Birth: April 02, 1928  Today's Date: 06/27/2015 Time: SLP Start Time (ACUTE ONLY): 1210 SLP Stop Time (ACUTE ONLY): 1240 SLP Time Calculation (min) (ACUTE ONLY): 30 min  Past Medical History:  Past Medical History  Diagnosis Date  . Hypertension   . Colon cancer ([place])   . Bowel obstruction ([place])   . GERD (gastroesophageal reflux disease) 10/15/2012  . Adenocarcinoma, lung [facility])    Past Surgical History:  Past Surgical History  Procedure Laterality Date  . Colon surgery    . Colectomy  s/p colon cancer 2000    . Colostomy     HPI:  81 yo adm to [facility] with acute on chronic respiratory failure.  PMH + for metastatic lung cancer - on palliative radiation.  Pt required thoracentesis during admit.  Swallow evaluation ordered.     Assessment / Plan / Recommendation Clinical Impression  Pt presents symptoms of mild oral dysphagia - she accepted only minimal intake of water, juice and applesauce via tsp and straw.  Mildly delay oral transiting and swallow noted but no symptoms of aspiration or significant pharyngeal dysphagia.  Pt took very small boluses and expressed desire for only minimal amount.  She declined to consume solids despite multiple attempts by this SLP.    Due to h/o palliative radiation that may contribute to esoph deficits, premorbid dysphagia, and current oral deficits, recommend start full liquid diet with precautions.    SLP communicated using spanish phrases verbally with yes/no answers from pt.     Aspiration Risk  Moderate    Diet Recommendation Thin   Medication Administration: Crushed with puree Compensations: Slow rate;Check for pocketing;Small sips/bites    Other  Recommendations Oral Care Recommendations: Oral care BID   Follow Up Recommendations       Frequency and Duration min 1 x/week  1 week   Pertinent Vitals/Pain Afebrile, decreased      Swallow  Study Prior Functional Status   see hhx    General Date of Onset: 06/27/15 Other Pertinent Information: 81 yo adm to [facility] with acute on chronic respiratory failure.  PMH + for metastatic lung cancer - on palliative radiation.  Pt required thoracentesis during admit.  Swallow evaluation ordered.   Type of Study: Bedside swallow evaluation Diet Prior to this Study: NPO Temperature Spikes Noted: No Respiratory Status: Supplemental O2 delivered via (comment) History of Recent Intubation: No Behavior/Cognition: Alert;Cooperative;Confused Oral Cavity - Dentition:  (poor dentition) Self-Feeding Abilities: Total assist Patient Positioning: Upright in bed Baseline Vocal Quality: Low vocal intensity Volitional Cough: Cognitively unable to elicit Volitional Swallow: Unable to elicit    Oral/Motor/Sensory Function Overall Oral Motor/Sensory Function:  (generalized weakness )   Ice Chips Ice chips: Not tested   Thin Liquid Thin Liquid: Impaired Presentation: Straw Oral Phase Impairments: Reduced lingual movement/coordination;Impaired anterior to posterior transit Oral Phase Functional Implications: Prolonged oral transit    Nectar Thick Nectar Thick Liquid: Not tested   Honey Thick Honey Thick Liquid: Not tested   Puree Puree: Impaired Presentation: Spoon Oral Phase Impairments: Reduced lingual movement/coordination;Impaired anterior to posterior transit Oral Phase Functional Implications: Prolonged oral transit Pharyngeal Phase Impairments: Suspected delayed Swallow   Solid   GO    Solid: Not tested Other Comments: pt declined to consume solids       Luanna Salk, [place] [facility] SLP [phone]

## 2015-06-27 NOTE — Care Management Note (Signed)
Case Management Note  Patient Details  Name: Tamana Hatfield MRN: [id number] Date of Birth: December 07, 1927  Subjective/Objective:                 sepsis   Action/Plan: Date:  Oct. 04, 2016 U.R. performed for needs and level of care. Will continue to follow for Case Management needs.  Velva Harman, RN, BSN, [place]   [phone]  Expected Discharge Date:   (unknown)               Expected Discharge Plan:  Home/Self Care  In-House Referral:  NA  Discharge planning Services  CM Consult  Post Acute Care Choice:  NA Choice offered to:  NA  DME Arranged:    DME Agency:     HH Arranged:    HH Agency:     Status of Service:  In process, will continue to follow  Medicare Important Message Given:    Date Medicare IM Given:    Medicare IM give by:    Date Additional Medicare IM Given:    Additional Medicare Important Message give by:     If discussed at [place] of Stay Meetings, dates discussed:    Additional Comments:  Leeroy Cha, RN 06/27/2015, 11:21 AM

## 2015-06-27 NOTE — Progress Notes (Signed)
Initial Nutrition Assessment  DOCUMENTATION CODES:   Severe malnutrition in context of chronic illness  INTERVENTION:  - Diet advancement as medically feasible, if within [place] - RD will continue to monitor for needs and GOC  NUTRITION DIAGNOSIS:   Inadequate oral intake related to inability to eat as evidenced by NPO status.  GOAL:   Patient will meet greater than or equal to 90% of their needs  MONITOR:   Diet advancement, Weight trends, Labs, Skin, I & O's  REASON FOR ASSESSMENT:   Malnutrition Screening Tool  ASSESSMENT:   81 y.o. 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 Per patient's daughter, she has not had a fever, productive cough, wheezing or rhonchi. However, she has noticed decreased mentation, decreased appetite and an overall deterioration in the past few weeks despite treatment at the cancer center. She states that her mother said that she earlier today when she was more alert that she was tired,ready to die, asked her to stop treatments and let her go to heaven.  Pt seen for MST. BMI indicates normal weight status. Pt has been NPO since admission. She was confused and unable to provide information at time of RD visit and no family/visitors present at this time.  Physical assessment shows severe muscle and fat wasting to upper and lower body. Per chart review, pt's weight has had severe fluctuations over the past 2 months.  Will monitor for diet advancement, GOC during admission. Not able to meet needs at this time. Medications reviewed. Lasb reviewed; Ca: 7.9 mg/dL.   Diet Order:  Diet NPO time specified  Skin:  Wound (see comment) (Stage 3 sacral and stage 2 mid low  back pressure ulcers)  Last BM:  10/3  Height:   Ht Readings from Last 1 Encounters:  06/26/15 '4\' 11"'$  (1.499 m)    Weight:   Wt Readings from Last 1 Encounters:  06/26/15 98 lb (44.453 kg)    Ideal Body Weight:  44.09 kg (kg)  BMI:  Body mass index is 19.78 kg/(m^2).  Estimated Nutritional Needs:   Kcal:  1400-1600  Protein:  55-65 grams  Fluid:  2-2.2 L/day  EDUCATION NEEDS:   No education needs identified at this time     Jarome Matin, RD, LDN Inpatient Clinical Dietitian Pager # [phone] After hours/weekend pager # [phone]

## 2015-06-28 DIAGNOSIS — Z7189 Other specified counseling: Secondary | ICD-10-CM | POA: Diagnosis present

## 2015-06-28 DIAGNOSIS — E8809 Other disorders of plasma-protein metabolism, not elsewhere classified: Secondary | ICD-10-CM

## 2015-06-28 DIAGNOSIS — I10 Essential (primary) hypertension: Secondary | ICD-10-CM

## 2015-06-28 LAB — URINE CULTURE: CULTURE: NO GROWTH

## 2015-06-28 LAB — RHEUMATOID FACTORS, FLUID: RHEUMATOID ARTHRITIS, QN/FLUID: NEGATIVE

## 2015-06-28 MED ORDER — LIP MEDEX EX OINT
TOPICAL_OINTMENT | CUTANEOUS | Status: AC
  Administered 2015-06-28: 18:00:00
  Filled 2015-06-28: qty 7

## 2015-06-28 NOTE — Progress Notes (Signed)
Voicemail message left on daughter's phone concerning patient being moved to Room 1323, 3W. Report called to floor RN. Patient is stable at transfer.

## 2015-06-28 NOTE — Progress Notes (Addendum)
[facility]   HEMATOLOGY/ONCOLOGY INPATIENT PROGRESS NOTE  Date of Service: 06/28/2015  Inpatient Attending: .Venetia Maxon Rama, MD   SUBJECTIVE I met with Ashley Green last evening and again this morning. She is certainly more comfortable after the therapeutic thoracentesis. Discussed with Dr. Chase Caller from PCCM to consider indwelling tunneled pleural catheter for symptomatic relief of recurrent pleural effusion if this were noted to be recurrent. -I met with Ashley Green the patient's daughter I have been in discussions with over the last several months. Patient is certainly much more comfortable overall than she was when we first saw her. However given her limitations of oral intake and age even though she has generally tolerated 2 doses of Nivolumab and palliative RT reasonably overall, her performance status and nutritional status has progressively declined on account of her aggressive malignancy. It is too early to determine whether her immunotherapy is effective. Pseudo-progression with the first couple of cycles is also a known phenomenon given increase infiltration of the tumor with immune cells. Understanding all this and given the poor performance status of the patient, and the patient and her daughters overall desire to focus on best supportive cares this is the route that is most appropriate at this time. I had a detailed discussion with Ashley Green today and on several occassions previously and understood her wishes for her mother and have a good understanding of the patient's wishes.   OBJECTIVE:  No acute distress resting comfortably in bed  PHYSICAL EXAMINATION: . Filed Vitals:   06/28/15 1100 06/28/15 1200 06/28/15 1357 06/28/15 2020  BP: 106/44 99/41 105/48 100/48  Pulse: 92 93 96 94  Temp:   98 F (36.7 C) 98.1 F (36.7 C)  TempSrc:   Oral Axillary  Resp: _0 Height:      Weight:      SpO2: 92% 93% 95% 99%   Filed Weights   06/26/15 1825  Weight: 98 lb (44.453 kg)    .Body mass index is 19.78 kg/(m^2).  GENERAL:alert, in no acute distress, frail-appearing elderly Hispanic female resting in bed SKIN: skin color, texture, turgor are normal, no rashes or significant lesions EYES: normal, conjunctiva are pink and non-injected, sclera clear OROPHARYNX:no exudate, no erythema and lips, buccal mucosa, and tongue normal  NECK: supple, no JVD, thyroid normal size, non-tender, without nodularity LYMPH:  no palpable lymphadenopathy in the cervical, axillary or inguinal LUNGS:decreased breath sounds at right base but much improved after thoracentesis. HEART: regular rate & rhythm,  no murmurs and no lower extremity edema ABDOMEN: abdomen soft, non-tender, normoactive bowel sounds . Musculoskeletal: no cyanosis of digits and no clubbing  PSYCH: alert & oriented x 3 with fluent speech NEURO: no focal motor/sensory deficits  MEDICAL HISTORY:   Past Medical History  Diagnosis Date  . Hypertension   . Colon cancer ([place])   . Bowel obstruction ([place])   . GERD (gastroesophageal reflux disease) 10/15/2012  . Adenocarcinoma, lung ([place])     SURGICAL HISTORY: Past Surgical History  Procedure Laterality Date  . Colon surgery    . Colectomy  s/p colon cancer 2000    . Colostomy      SOCIAL HISTORY: Social History   Social History  . Marital Status: Single    Spouse Name: N/A  . Number of Children: 3  . Years of Education: N/A   Occupational History  . retired    Social History Main Topics  . Smoking status: Never Smoker   . Smokeless tobacco: Never Used  .  Alcohol Use: No  . Drug Use: No  . Sexual Activity: No   Other Topics Concern  . Not on file   Social History Narrative   Works at [employer].     FAMILY HISTORY: Family History  Problem Relation Age of Onset  . Hodgkin's lymphoma Son   . Heart attack Mother   . Brain cancer Sister   . Pneumonia Father   . Alcoholism Brother   . Pneumonia Brother     ALLERGIES:  has No  Known Allergies.  MEDICATIONS:  Scheduled Meds: . antiseptic oral rinse  7 mL Mouth Rinse q12n4p  . chlorhexidine  15 mL Mouth Rinse BID  . dexamethasone  0.5 mg Oral BID  . feeding supplement  1 Container Oral TID BM  . nystatin  5 mL Oral QID  . OxyCODONE  10 mg Oral Q12H  . pantoprazole  40 mg Oral Daily  . polyethylene glycol  17 g Oral Daily  . QUEtiapine  12.5 mg Oral QHS  . senna-docusate  2 tablet Oral BID  . sodium chloride  3 mL Intravenous Q12H  . cyanocobalamin  2,000 mcg Oral BID   Continuous Infusions:  PRN Meds:.sodium chloride, acetaminophen, alum & mag hydroxide-simeth, guaiFENesin-dextromethorphan, hydrocortisone, HYDROmorphone HCl, ondansetron, sodium chloride  REVIEW OF SYSTEMS:    10 Point review of Systems was done is negative except as noted above.   LABORATORY DATA:  I have reviewed the data as listed  . CBC Latest Ref Rng 06/27/2015 06/26/2015 06/21/2015  WBC 4.0 - 10.5 K/uL 10.7(H) 11.9(H) 13.4(H)  Hemoglobin 12.0 - 15.0 g/dL 8.6(L) 9.8(L) 10.4(L)  Hematocrit 36.0 - 46.0 % 25.6(L) 29.8(L) 31.3(L)  Platelets 150 - 400 K/uL 344 342 370    . CMP Latest Ref Rng 06/27/2015 06/26/2015 06/26/2015  Glucose 65 - 99 mg/dL 111(H) 132 -  BUN 6 - 20 mg/dL 18 16.4 -  Creatinine 0.44 - 1.00 mg/dL 0.49 0.6 -  Sodium 135 - 145 mmol/L 138 139 -  Potassium 3.5 - 5.1 mmol/L 4.3 4.3 -  Chloride 101 - 111 mmol/L 102 - -  CO2 22 - 32 mmol/L 24 23 -  Calcium 8.9 - 10.3 mg/dL 7.9(L) 8.6 -  Total Protein 6.5 - 8.1 g/dL 5.6(L) 5.9(L) 5.9(L)  Total Bilirubin 0.3 - 1.2 mg/dL 0.3 <0.30 -  Alkaline Phos 38 - 126 U/L 78 97 -  AST 15 - 41 U/L 28 20 -  ALT 14 - 54 U/L 15 15 -     RADIOGRAPHIC STUDIES: I have personally reviewed the radiological images as listed and agreed with the findings in the report. Dg Chest 2 View  06/26/2015   CLINICAL DATA:  Metastatic sarcomatoid lung cancer. Complains of shortness of breath.  EXAM: CHEST  2 VIEW  COMPARISON:  Chest radiograph  06/06/2015.  FINDINGS: There has been interval development of large right pleural effusion. Again seen is right peritracheal upper lobe soft tissue mass. The left lung is clear. No evidence of pneumothorax.  Cardiac silhouette is partially obscured. Atherosclerotic disease of the aorta is seen.  IMPRESSION: Interval development of large right pleural effusion.  Stable to decreased in size right peritracheal upper lobe known lung cancer.   Electronically Signed   By: Fidela Salisbury M.D.   On: 06/26/2015 20:36   Ct Head Wo Contrast  06/06/2015   CLINICAL DATA:  Confusion starting yesterday. Metastatic adenocarcinoma of the lung.  EXAM: CT HEAD WITHOUT CONTRAST  TECHNIQUE: Contiguous axial images were obtained from  the base of the skull through the vertex without intravenous contrast.  COMPARISON:  Brain MR off 03/22/2011  FINDINGS: Sinuses/Soft tissues: Hypoplastic right frontal sinus. Other paranasal sinuses and mastoid air cells are clear.  Intracranial: Expected cerebral and cerebellar volume loss for age. Relatively mild for age low density in the periventricular white matter likely related to small vessel disease. No mass lesion, hemorrhage, hydrocephalus, acute infarct, intra-axial, or extra-axial fluid collection.  IMPRESSION: 1.  No acute intracranial abnormality. 2. Mild cerebral atrophy and small vessel ischemic change.   Electronically Signed   By: Abigail Miyamoto M.D.   On: 06/06/2015 19:28   Ct Chest Wo Contrast  06/26/2015   CLINICAL DATA:  New onset of breathing difficulties in a patient being treated for a lung cancer with chemotherapy. History of colon cancer in 2001.  EXAM: CT CHEST WITHOUT CONTRAST  TECHNIQUE: Multidetector CT imaging of the chest was performed following the standard protocol without IV contrast.  COMPARISON:  CT of the chest dated 06/06/2015  FINDINGS: Again seen is a right upper lobe pulmonary mass, with extension to the right mediastinum, and associated mediastinal  lymphadenopathy. There has been interval development of large loculated right pleural effusion with severe compressive atelectasis of the remaining of the right lung parenchyma. The mediastinal structures are mildly displaced to the left. There is a small left pleural effusion with subsegmental atelectasis. There is an 8 mm soft tissue nodule in the inferior left lower lobe, in peridiaphragmatic location. Thoracic aorta is normal in caliber. There is a mild pericardial thickening.  Airways are patent.  Visualized upper abdomen demonstrates interval enlargement of bilateral adrenal masses, with the right adrenal mass, which is partially visualized measuring 6.0 x 3.2 cm. Soft tissue nodule is seen within the anterior mesentery, likely representing metastatic nodule. There is also a 1.7 cm subcutaneous soft tissue nodule in the lateral/anterior abdominal wall, likely also metastatic.  Previously-seen left paraspinal soft tissue masses in the thoracic region, with neural foramina extension have enlarged and are causing lytic destruction of the left-sided pedicles and vertebral bodies of T7 and T9. There are healed to healing left posterior rib fractures.  IMPRESSION: Relatively stable in size right upper lobe pulmonary mass, with interval development of large loculated right pleural effusion and severe compressive atelectasis of the remaining of the right lung parenchyma.  Mild leftward mediastinal shift.  Mild pericardial thickening.  Interval development of small left pleural effusion and an 8 mm. peridiaphragmatic left lower lobe soft tissue nodule.  Interval enlargement of bilateral adrenal masses, with the right adrenal mass, which is incompletely visualized, measuring up to 6 cm.  Anterior mesenteric metastatic nodule.  Right abdominal wall 1.7 cm metastatic nodule.  Interval enlargement of left perispinal soft tissue metastatic masses, with osteolytic changes of the pedicles and left vertebral bodies of T7 and  T9.  These results will be called to the ordering clinician or representative by the Radiologist Assistant, and communication documented in the PACS or zVision Dashboard.   Electronically Signed   By: Fidela Salisbury M.D.   On: 06/26/2015 20:34   Dg Chest Port 1 View  06/27/2015   CLINICAL DATA:  Status post thoracentesis.  Initial encounter.  EXAM: PORTABLE CHEST 1 VIEW  COMPARISON:  Chest radiograph and CT of the chest performed earlier today at 8:01 p.m.  FINDINGS: Status post thoracentesis, a residual small right pleural effusion is noted, with underlying right-sided airspace opacification. This may reflect asymmetric pulmonary edema or pneumonia. The known right  upper lobe mass is again noted. The left lung appears relatively clear. No pneumothorax is seen.  The cardiomediastinal silhouette is borderline enlarged. No acute osseous abnormalities are identified.  IMPRESSION: Residual small right pleural effusion noted status post thoracentesis, with underlying right-sided airspace opacification. This may reflect asymmetric pulmonary edema or pneumonia. The patient's right upper lobe lung mass is again noted.   Electronically Signed   By: Garald Balding M.D.   On: 06/27/2015 00:11   Dg Abd Acute W/chest  06/06/2015   CLINICAL DATA:  Nausea, vomiting, shortness of breath and weakness. History of colon cancer and lung cancer. 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 Initial encounter.  EXAM: DG ABDOMEN ACUTE W/ 1V CHEST  COMPARISON:  PET CT 05/04/2015. Acute abdominal series done 10/23/2009.  FINDINGS: Large right upper lobe mass is grossly stable from recent PET-CT. The heart size and mediastinal contours are stable. The lungs are otherwise clear.  The bowel gas pattern is nonobstructive. There is no free intraperitoneal air. A moderate amount of stool is present throughout the colon. Multiple pelvic surgical clips are present. There  are degenerative changes throughout the thoracolumbar spine.  IMPRESSION: 1. Prominent stool throughout the colon suggesting constipation. 2. Stable right upper lobe lung mass. 3. No acute findings identified.   Electronically Signed   By: Richardean Sale M.D.   On: 06/06/2015 16:17    ASSESSMENT & PLAN:  Mrs. Kepple is a very pleasant 81 year old Hispanic female in good overall health with initial ECOG performance status of 1 and previous history of colon cancer in 2000 and 2001 now with worsening performance status of 2-3 and a admission for increasing shortness of breath and delirium.  #1 Stage IV Metastatic poorly differentiated lung carcinoma with sarcomatoid features with mediastinal adenopathy, metastases to bilateral adrenal glands, paraspinal muscles and pancreas as well as abdominal lymph nodes and skin.  CEA level within normal limits. CA-19-9 level somewhat elevated likely due to metastases to the pancreas and less consistent with primary pancreatic metastatic cancer. LDH level within normal limits. ECOG performance status is 2-3 #2 Painful metastases to the spinal muscles over upper back. #3 Shortness of breath due to her lung mass causing some airway compression. #4 Symptomatic anemia due to her metastatic cancer. #5 Pain due to her cancer #6 Microcytic anemia due to anemia of chronic disease ferritin level 171.  #7 Bilateral adrenal metastases with high risk for adrenal insufficiency. #8 dysphagia -no intraluminal masses on esophagogram . This symptom is likely from extrinsic compression from right upper lobe lung mass. #9 history of colorectal cancer diagnosed in 2000 treated with surgery followed by chemotherapy and radiation for her daughter. She had recurrence in 2001 when she was treated with further surgery and intraoperative radiation.  #10 Delirium - improved compared to previous situation where she was admitted for hyponatremia and anemia . Still with some intermittent  confusion and forgetfulness . Noted to have a new pneumonia 06/18/2015 and is currently on moxifloxacin. Poor oral intake  #11 right-sided large effusion with underlying asymmetry pulmonary edema versus pneumonia status post therapeutic and diagnostic thoracentesis with improvement in breathing.  #12 grade 1 oral mucositis due to Nivolumab - improving with dexamethasone mouth washes.  PLAN -As noted above the finding goal of care with the patient and her daughter who is her power of attorney.  Would plan to pursue best supportive care as of this time with no intention of additional Immunotherapy or chemotherapy at this  time. -swallowing evaluation and but appreciated. -Antibiotics and management of possible pneumonia and pleural effusion her hospitalist/PCCM. -if recurrent pleural effusion I agree with Dr. Chase Caller to consider tunneled indwelling pleural catheter for intermittent pleural fluid removal from a comfort standpoint. -Patient's overall symptom control is reasonable at this time. -Would recommend transitioning from OxyContin to the low dose weekly equianalgesic fentanyl patch given limited oral intake and difficulty swallowing the pill. -Would continue liquid oral Dilaudid when necessary for pain. -Would hold off on additional Immunotherapy or chemotherapy at this time and pursue best supportive cares. -Palliative care/SW consultation to work on the best discharge plan.  Ashley Green her daughter runs a Immunologist by herself and would be hard pressed to be able to take on a significant burden of her mother's end of life cares. She has been visiting her mom diligently and caring for her fantastically at the skilled nursing facility and previously at home. -Might need to consider nursing home with hospice cares versus residential hospice with [facility]. -I will continue to follow her and try to help in any capacity I can. -Please call if any additional questions arise.   I spent 30  minutes counseling the patient face to face. The total time spent in the appointment was 40 minutes and more than 50% was on counseling and direct patient cares.    Sullivan Lone MD [place] AAHIVMS [facility] Hematology/Oncology Physician [facility]  (Office):       [phone] (Work cell):  [phone] (Fax):           [phone]

## 2015-06-28 NOTE — Progress Notes (Signed)
Progress Note   Ashley Green MBW:[id number] DOB: Sep 16, 1928 DOA: 06/26/2015 PCP: [name], DO   Brief Narrative:   Ashley Green is an 81 y.o. 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 Upon initial evaluation, the patient was noted to have a significant right-sided pleural effusion and was tachypneic.  Assessment/Plan:   Principal Problem:   Acute on chronic respiratory failure (HCC)/acute respiratory failure with hypoxia (HCC) secondary to right sided malignant pleural effusion in the setting of sarcomatoid carcinoma of lung (HCC)/ dyspnea - Patient has indicated that she wishes to have comfort measures at this time. - Underwent a 1.5 L therapeutic thoracentesis 06/26/15. Consider a palliative Pleurx catheter if she has rapid reaccumulation of fluid. - Palliative care consulted.  Active Problems:   Hypertension - Blood pressure on the low side. Not on antihypertensives at this time.    Hyperlipidemia - No need for further treatment given overall prognosis.    Dysphagia - Evaluated by speech therapist 06/27/15. Full liquid diet started to correct medications.    Anemia in neoplastic disease - Hemoglobin trending down. No current indication for transfusion.    Pressure ulcer - Skin care per nursing staff. Frequent turning and positioning recommended.    Protein-calorie malnutrition, severe, with hypoalbuminemia - Evaluated by dietitian 06/27/15. Comfort feeds.    DVT Prophylaxis - Lovenox ordered.  Family Communication: Daughter updated at the bedside. Disposition Plan: From [facility], likely will return there vs placement at a residential hospice facility. Code Status:     Code Status Orders        Start     Ordered   06/26/15  2241  Do not attempt resuscitation (DNR)   Continuous    Question Answer Comment  In the event of cardiac or respiratory ARREST Do not call a "code blue"   In the event of cardiac or respiratory ARREST Do not perform Intubation, CPR, defibrillation or ACLS   In the event of cardiac or respiratory ARREST Use medication by any route, position, wound care, and other measures to relive pain and suffering. May use oxygen, suction and manual treatment of airway obstruction as needed for comfort.      06/26/15 2240        IV Access:    Peripheral IV   Procedures and diagnostic studies:   Dg Chest 2 View  06/26/2015   CLINICAL DATA:  Metastatic sarcomatoid lung cancer. Complains of shortness of breath.  EXAM: CHEST  2 VIEW  COMPARISON:  Chest radiograph 06/06/2015.  FINDINGS: There has been interval development of large right pleural effusion. Again seen is right peritracheal upper lobe soft tissue mass. The left lung is clear. No evidence of pneumothorax.  Cardiac silhouette is partially obscured. Atherosclerotic disease of the aorta is seen.  IMPRESSION: Interval development of large right pleural effusion.  Stable to decreased in size right peritracheal upper lobe known lung cancer.   Electronically Signed   By: Fidela Salisbury M.D.   On: 06/26/2015 20:36   Ct Chest Wo Contrast  06/26/2015   CLINICAL DATA:  New onset of breathing difficulties in a patient being treated for a lung cancer with chemotherapy. History of colon cancer in 2001.  EXAM: CT CHEST WITHOUT CONTRAST  TECHNIQUE: Multidetector CT imaging of the chest was performed following the standard protocol without  IV contrast.  COMPARISON:  CT of the chest dated 06/06/2015  FINDINGS: Again seen is a right upper lobe pulmonary mass, with extension to the right mediastinum, and associated mediastinal lymphadenopathy. There has been interval development of large loculated right pleural effusion with severe compressive atelectasis of the  remaining of the right lung parenchyma. The mediastinal structures are mildly displaced to the left. There is a small left pleural effusion with subsegmental atelectasis. There is an 8 mm soft tissue nodule in the inferior left lower lobe, in peridiaphragmatic location. Thoracic aorta is normal in caliber. There is a mild pericardial thickening.  Airways are patent.  Visualized upper abdomen demonstrates interval enlargement of bilateral adrenal masses, with the right adrenal mass, which is partially visualized measuring 6.0 x 3.2 cm. Soft tissue nodule is seen within the anterior mesentery, likely representing metastatic nodule. There is also a 1.7 cm subcutaneous soft tissue nodule in the lateral/anterior abdominal wall, likely also metastatic.  Previously-seen left paraspinal soft tissue masses in the thoracic region, with neural foramina extension have enlarged and are causing lytic destruction of the left-sided pedicles and vertebral bodies of T7 and T9. There are healed to healing left posterior rib fractures.  IMPRESSION: Relatively stable in size right upper lobe pulmonary mass, with interval development of large loculated right pleural effusion and severe compressive atelectasis of the remaining of the right lung parenchyma.  Mild leftward mediastinal shift.  Mild pericardial thickening.  Interval development of small left pleural effusion and an 8 mm. peridiaphragmatic left lower lobe soft tissue nodule.  Interval enlargement of bilateral adrenal masses, with the right adrenal mass, which is incompletely visualized, measuring up to 6 cm.  Anterior mesenteric metastatic nodule.  Right abdominal wall 1.7 cm metastatic nodule.  Interval enlargement of left perispinal soft tissue metastatic masses, with osteolytic changes of the pedicles and left vertebral bodies of T7 and T9.  These results will be called to the ordering clinician or representative by the Radiologist Assistant, and communication documented in  the PACS or zVision Dashboard.   Electronically Signed   By: Fidela Salisbury M.D.   On: 06/26/2015 20:34   Dg Chest Port 1 View  06/27/2015   CLINICAL DATA:  Status post thoracentesis.  Initial encounter.  EXAM: PORTABLE CHEST 1 VIEW  COMPARISON:  Chest radiograph and CT of the chest performed earlier today at 8:01 p.m.  FINDINGS: Status post thoracentesis, a residual small right pleural effusion is noted, with underlying right-sided airspace opacification. This may reflect asymmetric pulmonary edema or pneumonia. The known right upper lobe mass is again noted. The left lung appears relatively clear. No pneumothorax is seen.  The cardiomediastinal silhouette is borderline enlarged. No acute osseous abnormalities are identified.  IMPRESSION: Residual small right pleural effusion noted status post thoracentesis, with underlying right-sided airspace opacification. This may reflect asymmetric pulmonary edema or pneumonia. The patient's right upper lobe lung mass is again noted.   Electronically Signed   By: Garald Balding M.D.   On: 06/27/2015 00:11     Medical Consultants:    Pulmonology  Anti-Infectives:   Anti-infectives    None      Subjective:   Ashley Green is weak and unable to verbalize coherently.    Objective:    Filed Vitals:   06/28/15 0300 06/28/15 0400 06/28/15 0500 06/28/15 0600  BP: '91/43 84/30 91/43 '$ 92/41  Pulse: 98 100 96 93  Temp:  99.7 F (37.6 C)    TempSrc:  Axillary    Resp:  $'29 23 26 30  'F$ Height:      Weight:      SpO2: 94% 94% 93% 94%    Intake/Output Summary (Last 24 hours) at 06/28/15 0706 Last data filed at 06/28/15 0600  Gross per 24 hour  Intake   1830 ml  Output      1 ml  Net   1829 ml   Filed Weights   06/26/15 1825  Weight: 44.453 kg (98 lb)    Exam: Gen:  NAD Cardiovascular:  Tachy, No M/R/G Respiratory:  Lungs dimnished Gastrointestinal:  Abdomen soft, NT/ND, + BS, ostomy LLQ Extremities:  No C/E/C   Data Reviewed:     Labs: Basic Metabolic Panel:  Recent Labs Lab 06/21/15 1524 06/26/15 1440 06/27/15 0340  NA 137 139 138  K 4.5 4.3 4.3  CL  --   --  102  CO2 '24 23 24  '$ GLUCOSE 108 132 111*  BUN 17.1 16.4 18  CREATININE 0.6 0.6 0.49  CALCIUM 8.9 8.6 7.9*  MG 2.0  --   --    GFR Estimated Creatinine Clearance: 35.1 mL/min (by C-G formula based on Cr of 0.49). Liver Function Tests:  Recent Labs Lab 06/21/15 1524 06/26/15 1440 06/26/15 2358 06/27/15 0340  AST 26 20  --  28  ALT 18 15  --  15  ALKPHOS 119 97  --  78  BILITOT 0.36 <0.30  --  0.3  PROT 6.7 5.9* 5.9* 5.6*  ALBUMIN 1.9* 1.7*  --  1.8*    Recent Labs Lab 06/21/15 1526  LIPASE 30  AMYLASE 25   CBC:  Recent Labs Lab 06/21/15 1524 06/26/15 1440 06/27/15 0340  WBC 13.4* 11.9* 10.7*  NEUTROABS 12.1* 10.5* 9.6*  HGB 10.4* 9.8* 8.6*  HCT 31.3* 29.8* 25.6*  MCV 73.3* 72.7* 74.0*  PLT 370 342 344   Lipid Profile:  Recent Labs  06/26/15 2358  CHOL 89   Sepsis Labs:  Recent Labs Lab 06/21/15 1524 06/26/15 1440 06/27/15 0340  WBC 13.4* 11.9* 10.7*   Microbiology Recent Results (from the past 240 hour(s))  Body fluid culture     Status: None (Preliminary result)   Collection Time: 06/26/15 11:54 PM  Result Value Ref Range Status   Specimen Description FLUID PLEURAL RIGHT  Final   Special Requests NONE  Final   Gram Stain   Final    FEW WBC PRESENT, PREDOMINANTLY MONONUCLEAR NO ORGANISMS SEEN Performed at [facility]    Culture PENDING  Incomplete   Report Status PENDING  Incomplete  MRSA PCR Screening     Status: None   Collection Time: 06/27/15  3:16 AM  Result Value Ref Range Status   MRSA by PCR NEGATIVE NEGATIVE Final    Comment:        The GeneXpert MRSA Assay (FDA approved for NASAL specimens only), is one component of a comprehensive MRSA colonization surveillance program. It is not intended to diagnose MRSA infection nor to guide or monitor treatment for MRSA  infections.      Medications:   . antiseptic oral rinse  7 mL Mouth Rinse q12n4p  . chlorhexidine  15 mL Mouth Rinse BID  . dexamethasone  0.5 mg Oral BID  . feeding supplement  1 Container Oral TID BM  . nystatin  5 mL Oral QID  . OxyCODONE  10 mg Oral Q12H  . pantoprazole  40 mg Oral Daily  . polyethylene glycol  17 g Oral Daily  . QUEtiapine  12.5 mg Oral QHS  . senna-docusate  2 tablet Oral BID  . sodium chloride  3 mL Intravenous Q12H  . cyanocobalamin  2,000 mcg Oral BID   Continuous Infusions:   Time spent: 35 minutes with > 50% of time discussing current diagnostic test results, clinical impression and plan of care.   LOS: 2 days   Gavinn Collard  [facility] Pager [phone]. If unable to reach me by pager, please call my cell phone at [phone].  *Please refer to [email or web address], password [email or web address] to get updated schedule on who will round on this patient, as hospitalists switch teams weekly. If 7PM-7AM, please contact night-coverage at [email or web address], password [email or web address] for any overnight needs.  06/28/2015, 7:06 AM

## 2015-06-28 NOTE — Consult Note (Signed)
WOC ostomy consult note Stoma type/location: LLQ colostomy, 2-piece pouching system intact.  I snap off pouch to visualize stoma so that I might order supplies for nursing care. Stomal assessment/size: oval, approximately 1 inch x 1 and 1/8 inches, red, moist, slightly budded.  Current pouching system is very large (4-inches) Peristomal assessment: Not seen today Treatment options for stomal/peristomal skin: None indicated. Output brown stool (formed) Ostomy pouching: 2pc., 2 and 1/4 inch pouching system with skin barrier ring. Education provided: NoneEnrolled patient in [place] program: No [place] nursing team will not follow, but will remain available to this patient, the nursing and medical teams.  Please re-consult if needed. Thanks, Maudie Flakes, MSN, RN, [place], [place], [place] [phone])

## 2015-06-29 ENCOUNTER — Encounter: Payer: Self-pay | Admitting: Internal Medicine

## 2015-06-29 DIAGNOSIS — E785 Hyperlipidemia, unspecified: Secondary | ICD-10-CM

## 2015-06-29 DIAGNOSIS — Z515 Encounter for palliative care: Secondary | ICD-10-CM

## 2015-06-29 DIAGNOSIS — C3490 Malignant neoplasm of unspecified part of unspecified bronchus or lung: Principal | ICD-10-CM

## 2015-06-29 DIAGNOSIS — L8990 Pressure ulcer of unspecified site, unspecified stage: Secondary | ICD-10-CM

## 2015-06-29 DIAGNOSIS — R1310 Dysphagia, unspecified: Secondary | ICD-10-CM

## 2015-06-29 DIAGNOSIS — E43 Unspecified severe protein-calorie malnutrition: Secondary | ICD-10-CM

## 2015-06-29 DIAGNOSIS — Z7189 Other specified counseling: Secondary | ICD-10-CM

## 2015-06-29 DIAGNOSIS — R531 Weakness: Secondary | ICD-10-CM

## 2015-06-29 DIAGNOSIS — D630 Anemia in neoplastic disease: Secondary | ICD-10-CM

## 2015-06-29 DIAGNOSIS — G893 Neoplasm related pain (acute) (chronic): Secondary | ICD-10-CM

## 2015-06-29 MED ORDER — MORPHINE SULFATE (CONCENTRATE) 10 MG/0.5ML PO SOLN
5.0000 mg | ORAL | Status: DC | PRN
  Administered 2015-06-29 – 2015-07-01 (×3): 5 mg via ORAL
  Filled 2015-06-29 (×3): qty 0.5

## 2015-06-29 NOTE — Consult Note (Signed)
Consultation Note Date: 06/29/2015   Patient Name: Ashley Green  DOB: 07/01/1928  MRN: [id number]  Age / Sex: 81 y.o., female   PCP: Lorna Few, DO Referring Physician: Theodis Blaze, MD  Reason for Consultation: Establishing goals of care, Non pain symptom management, Pain control, Psychosocial/spiritual support and Terminal care  Palliative Care Assessment and Plan Summary of Established Goals of Care and Medical Treatment Preferences    Palliative Care Discussion Held Today:    This NP Wadie Lessen reviewed medical records, received report from team, assessed the patient and then meet at the patient's bedside along with her daughter Beryle Flock to discuss diagnosis, prognosis, [place], EOL wishes disposition and options.  A detailed discussion was had today regarding advanced directives.  Concepts specific to code status, artifical feeding and hydration, continued IV antibiotics and rehospitalization was had.  The difference between a aggressive medical intervention path  and a palliative comfort care path for this patient at this time was had.  Values and goals of care important to patient and family were attempted to be elicited.  Daughter understands the overall poor prognosis and this is very difficult for her to accept.  She and her mother have a very close relationship  Concept of Hospice and Palliative Care were discussed  Natural trajectory and expectations at EOL were discussed.  Questions and concerns addressed.  Family encouraged to call with questions or concerns.  PMT will continue to support holistically.    Primary Decision Maker: daughter   Goals of Care/Code Status/Advance Care Planning:   Code Status: DNR/DNI   Symptom Management:    Pain/Dyspnea: Roxanol 5 mg every 2 hrs prn  Psycho-social/Spiritual:   Support System: daughter and grandson  Desire for further Chaplaincy support:yes, will write for consult   Discharge Planning:  Pending  family decisions, daughter plans to visit Risk analyst Complaint: lethargy, SOB  History of Present Illness:   81 y.o. female with past medical history of metastatic poorly differentiated lung adenocarcinoma ([place] lung as primary with mets to adrenal, pancrease, lymph) with sarcomatoid features, h/o colon cancer, hypertension, untreated hyperlipidemia who was referred from the [facility] to this facility due to worsening dyspnea and confusion.   Decreased appetite and an overall deterioration in the past few weeks despite treatment at the cancer center. She states that her mother said that she earlier today when she was more alert that she was tired,ready to die, asked her to stop treatments and let her go to heaven.  Family faced with advanced directives and anticipatory care needs.  Primary Diagnoses  Present on Admission:  . Acute on chronic respiratory failure ([place]) . Dyspnea . Hyperlipidemia . Sarcomatoid carcinoma of lung ([place]) . Hypertension . Anemia in neoplastic disease . Hypoalbuminemia  Palliative Review of Systems:   -unable to illicit due to decreased cognition   I have reviewed the medical record, interviewed the patient and family, and examined the patient. The following aspects are pertinent.  Past Medical History  Diagnosis Date  . Hypertension   . Colon cancer ([place])   . Bowel obstruction ([place])   . GERD (gastroesophageal reflux disease) 10/15/2012  . Adenocarcinoma, lung [facility])    Social History   Social History  . Marital Status: Single    Spouse Name: N/A  . Number of Children: 3  . Years of Education: N/A   Occupational History  . retired    Social History Main Topics  . Smoking status:  Never Smoker   . Smokeless tobacco: Never Used  . Alcohol Use: No  . Drug Use: No  . Sexual Activity: No   Other Topics Concern  . None   Social History Narrative   Works at [employer].    Family History  Problem Relation Age  of Onset  . Hodgkin's lymphoma Son   . Heart attack Mother   . Brain cancer Sister   . Pneumonia Father   . Alcoholism Brother   . Pneumonia Brother    Scheduled Meds: . antiseptic oral rinse  7 mL Mouth Rinse q12n4p  . chlorhexidine  15 mL Mouth Rinse BID  . dexamethasone  0.5 mg Oral BID  . feeding supplement  1 Container Oral TID BM  . nystatin  5 mL Oral QID  . OxyCODONE  10 mg Oral Q12H  . pantoprazole  40 mg Oral Daily  . polyethylene glycol  17 g Oral Daily  . QUEtiapine  12.5 mg Oral QHS  . senna-docusate  2 tablet Oral BID  . sodium chloride  3 mL Intravenous Q12H  . cyanocobalamin  2,000 mcg Oral BID   Continuous Infusions:  PRN Meds:.sodium chloride, acetaminophen, alum & mag hydroxide-simeth, guaiFENesin-dextromethorphan, hydrocortisone, HYDROmorphone HCl, ondansetron, sodium chloride Medications Prior to Admission:  Prior to Admission medications   Medication Sig Start Date End Date Taking? Authorizing Provider  cyanocobalamin 2000 MCG tablet Take 2,000 mcg by mouth 2 (two) times daily.   Yes Historical Provider, MD  dexamethasone (DECADRON) 0.5 MG/5ML solution Take 5 mLs (0.5 mg total) by mouth 2 (two) times daily. Hold in the mouth and swish in the mouth for as long as possible 06/21/15  Yes Gautam Juleen China, MD  feeding supplement (BOOST / RESOURCE BREEZE) LIQD Take 1 Container by mouth 3 (three) times daily between meals. 06/09/15  Yes Kelvin Cellar, MD  magnesium citrate SOLN Take 0.5 Bottles by mouth daily as needed (constipation).    Yes Historical Provider, MD  mirtazapine (REMERON) 15 MG tablet Take 1 tablet (15 mg total) by mouth at bedtime. 06/12/15  Yes Gautam Juleen China, MD  morphine (ROXANOL) 20 MG/ML concentrated solution Take 0.25 mLs (5 mg total) by mouth every 4 (four) hours as needed for severe pain or breakthrough pain. 06/09/15  Yes Kelvin Cellar, MD  nystatin (MYCOSTATIN) 100000 UNIT/ML suspension Take 5 mLs (500,000 Units total) by mouth 4  (four) times daily. 06/21/15  Yes Brunetta Genera, MD  omeprazole (PRILOSEC) 40 MG capsule Take 1 capsule (40 mg total) by mouth daily. 10/15/12  Yes Dayarmys Piloto de [name], MD  ondansetron (ZOFRAN-ODT) 4 MG disintegrating tablet Take 1 tablet (4 mg total) by mouth every 8 (eight) hours as needed for nausea or vomiting. 04/20/15  Yes [name], MD  Ostomy Supplies (NATURA STOMAHESIVE MOLDABLE) [facility] 1 each by Does not apply route 2 (two) times daily. 02/24/13  Yes Dayarmys [place] de [name], MD  OxyCODONE (OXYCONTIN) 10 mg T12A 12 hr tablet Take 1 tablet (10 mg total) by mouth every 12 (twelve) hours. 06/09/15  Yes Kelvin Cellar, MD  polyethylene glycol (MIRALAX / GLYCOLAX) packet Take 17 g by mouth daily. 06/09/15  Yes Kelvin Cellar, MD  senna-docusate (SENOKOT-S) 8.6-50 MG per tablet Take 2 tablets by mouth 2 (two) times daily. 06/09/15  Yes Kelvin Cellar, MD  moxifloxacin (AVELOX) 400 MG tablet Take 400 mg by mouth daily at 8 pm. 07/19/15   Historical Provider, MD   No Known Allergies CBC:  Component Value Date/Time   WBC 10.7* 06/27/2015 0340   WBC 11.9* 06/26/2015 1440   HGB 8.6* 06/27/2015 0340   HGB 9.8* 06/26/2015 1440   HCT 25.6* 06/27/2015 0340   HCT 29.8* 06/26/2015 1440   PLT 344 06/27/2015 0340   PLT 342 06/26/2015 1440   MCV 74.0* 06/27/2015 0340   MCV 72.7* 06/26/2015 1440   NEUTROABS 9.6* 06/27/2015 0340   NEUTROABS 10.5* 06/26/2015 1440   LYMPHSABS 0.5* 06/27/2015 0340   LYMPHSABS 0.5* 06/26/2015 1440   MONOABS 0.6 06/27/2015 0340   MONOABS 1.0* 06/26/2015 1440   EOSABS 0.0 06/27/2015 0340   EOSABS 0.0 06/26/2015 1440   BASOSABS 0.0 06/27/2015 0340   BASOSABS 0.0 06/26/2015 1440   Comprehensive Metabolic Panel:    Component Value Date/Time   NA 138 06/27/2015 0340   NA 139 06/26/2015 1440   K 4.3 06/27/2015 0340   K 4.3 06/26/2015 1440   CL 102 06/27/2015 0340   CO2 24 06/27/2015 0340   CO2 23 06/26/2015 1440   BUN 18 06/27/2015 0340   BUN  16.4 06/26/2015 1440   CREATININE 0.49 06/27/2015 0340   CREATININE 0.6 06/26/2015 1440   CREATININE 0.57* 04/17/2015 1610   GLUCOSE 111* 06/27/2015 0340   GLUCOSE 132 06/26/2015 1440   CALCIUM 7.9* 06/27/2015 0340   CALCIUM 8.6 06/26/2015 1440   AST 28 06/27/2015 0340   AST 20 06/26/2015 1440   ALT 15 06/27/2015 0340   ALT 15 06/26/2015 1440   ALKPHOS 78 06/27/2015 0340   ALKPHOS 97 06/26/2015 1440   BILITOT 0.3 06/27/2015 0340   BILITOT <0.30 06/26/2015 1440   PROT 5.6* 06/27/2015 0340   PROT 5.9* 06/26/2015 1440   ALBUMIN 1.8* 06/27/2015 0340   ALBUMIN 1.7* 06/26/2015 1440    Physical Exam:  Vital Signs: BP 95/52 mmHg  Pulse 94  Temp(Src) 97.5 F (36.4 C) (Axillary)  Resp 20  Ht '4\' 11"'$  (1.499 m)  Wt 44.453 kg (98 lb)  BMI 19.78 kg/m2  SpO2 93% SpO2: SpO2: 93 % O2 Device: O2 Device: Not Delivered O2 Flow Rate: O2 Flow Rate (L/min): 2 L/min Intake/output summary:  Intake/Output Summary (Last 24 hours) at 06/29/15 1357 Last data filed at 06/29/15 0600  Gross per 24 hour  Intake      0 ml  Output    525 ml  Net   -525 ml   LBM: Last BM Date: 06/28/15 Baseline Weight: Weight: 44.453 kg (98 lb) Most recent weight: Weight: 44.453 kg (98 lb)  Exam Findings:   General: ill appearing frail, cachetic  HEENT: dry buccal membranes CVS: RRR Resp: decreased in bases Abd: tenderness on gentle palpation, noted colostomy Extrem: without edema Skin: warm and dry Neuro: lethargic           Palliative Performance Scale: 20 %                Additional Data Reviewed: Recent Labs     06/26/15  1440  06/27/15  0340  WBC  11.9*  10.7*  HGB  9.8*  8.6*  PLT  342  344  NA  139  138  BUN  16.4  18  CREATININE  0.6  0.49     Time In: 1400 Time Out: 1515 Time Total: 75 min  Greater than 50%  of this time was spent counseling and coordinating care related to the above assessment and plan.  Discussed with nursing  Signed by: Wadie Lessen, NP  [place]ty,  NP  06/29/2015, 1:57 PM  Please contact Palliative Medicine Team phone at [phone] for questions and concerns.   See AMION for contact information

## 2015-06-29 NOTE — Progress Notes (Signed)
Patient ID: Ashley Green, female   DOB: 02-12-1928, 81 y.o.   MRN: [id number]  [facility] PROGRESS NOTE  Ashley Green JSH:[id number] DOB: 25-Jun-1928 DOA: 06/26/2015 PCP: Ashley Panning, DO   Brief narrative:    81 y.o. 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 Upon initial evaluation, the patient was noted to have a significant right-sided pleural effusion.  Assessment/Plan:    Principal Problem:   Acute on chronic respiratory failure (HCC) with hypoxia (HCC) secondary to right sided malignant pleural effusion in the setting of sarcomatoid carcinoma of lung ([place]) - Patient has indicated that she wishes to have comfort measures at this time. - Underwent a 1.5 L therapeutic thoracentesis 06/26/15. - will consider a palliative Pleurx catheter if she has rapid reaccumulation of fluid. 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 - full liquid diet started   Hypertension - Blood pressure on the low side - Not on antihypertensives at this time.   Hyperlipidemia - No need for further treatment given overall  prognosis   Anemia in neoplastic disease, microcytic  - no further blood work needed to ensure comfort - no indication for transfusion, last HG 10/04 was 8.6 - no signs of active bleeding    Pressure ulcer - Skin care per nursing staff. Frequent turning and positioning recommended.   Protein-calorie malnutrition, severe, with hypoalbuminemia - Evaluated by dietitian 06/27/15. Comfort feeds.    Grade 1 oral mucositis due to Nivolumab  - improving with mouth washes.  DVT prophylaxis - Lovenox SQ  Code Status: DNR Family Communication:  No family at bedside  Disposition Plan: will go back to [facility] vs residential hospice  IV access:  Peripheral IV  Procedures and diagnostic studies:    Dg Chest 2 View 06/26/2015  Interval development of large right pleural effusion.  Stable to decreased in size right peritracheal upper lobe known lung cancer.     Ct Head Wo Contrast 06/06/2015   No acute intracranial abnormality. 2. Mild cerebral atrophy and small vessel ischemic change.    Ct Chest Wo Contrast 06/26/2015  Relatively stable in size right upper lobe pulmonary mass, with interval development of large loculated right pleural effusion and severe compressive atelectasis of the remaining of the right lung parenchyma.  Mild leftward mediastinal shift.  Mild pericardial thickening.  Interval development of small left pleural effusion and an 8 mm. peridiaphragmatic left lower lobe soft tissue nodule.  Interval enlargement of bilateral adrenal masses, with the right adrenal mass, which is incompletely visualized, measuring up to 6 cm.  Anterior mesenteric metastatic nodule.  Right abdominal wall 1.7 cm metastatic nodule.  Interval enlargement of left perispinal soft tissue metastatic masses, with osteolytic changes of the pedicles and left  vertebral bodies of T7 and T9.    Dg Chest Port 1 View 06/27/2015   Residual small right pleural effusion noted status post thoracentesis, with  underlying right-sided airspace opacification. This may reflect asymmetric pulmonary edema or pneumonia. The patient's right upper lobe lung mass is again noted.     Dg Abd Acute W/chest 06/06/2015   Prominent stool throughout the colon suggesting constipation. 2. Stable right upper lobe lung mass. 3. No acute findings identified.    Medical Consultants:  Oncology PCT  Other Consultants:  None  IAnti-Infectives:   None  Ashley Ramsay, MD  [facility] Pager [phone]  If 7PM-7AM, please contact night-coverage [email or web address] Password [email or web address] 06/29/2015, 2:55 PM   LOS: 3 days   HPI/Subjective: No events overnight.   Objective: Filed Vitals:   06/28/15 1357 06/28/15 2020 06/29/15 0556 06/29/15 1428  BP: 105/48 100/48 95/52 109/51  Pulse: 96 94 94 96  Temp: 98 F (36.7 C) 98.1 F (36.7 C) 97.5 F (36.4 C) 97.4 F (36.3 C)  TempSrc: Oral Axillary Axillary Axillary  Resp: '20 24 20 20  '$ Height:      Weight:      SpO2: 95% 99% 93% 96%    Intake/Output Summary (Last 24 hours) at 06/29/15 1455 Last data filed at 06/29/15 0600  Gross per 24 hour  Intake      0 ml  Output    525 ml  Net   -525 ml    Exam:   General:  Pt is alert, NAD  Cardiovascular: Regular rate and rhythm, no rubs, no gallops  Respiratory: Clear to auscultation bilaterally, diminished breath sounds at bases   Abdomen: Soft, non tender, non distended, bowel sounds present, no guarding  Data Reviewed: Basic Metabolic Panel:  Recent Labs Lab 06/26/15 1440 06/27/15 0340  NA 139 138  K 4.3 4.3  CL  --  102  CO2 23 24  GLUCOSE 132 111*  BUN 16.4 18  CREATININE 0.6 0.49  CALCIUM 8.6 7.9*   Liver Function Tests:  Recent Labs Lab 06/26/15 1440 06/26/15 2358 06/27/15 0340  AST 20  --  28  ALT 15  --  15  ALKPHOS 97  --  78  BILITOT <0.30  --  0.3  PROT 5.9* 5.9* 5.6*  ALBUMIN 1.7*  --  1.8*   CBC:  Recent Labs Lab 06/26/15 1440 06/27/15 0340  WBC 11.9* 10.7*  NEUTROABS 10.5* 9.6*   HGB 9.8* 8.6*  HCT 29.8* 25.6*  MCV 72.7* 74.0*  PLT 342 344   Recent Results (from the past 240 hour(s))  AFB culture with smear     Status: None (Preliminary result)   Collection Time: 06/26/15 11:54 PM  Result Value Ref Range Status   Specimen Description PLEURAL  Final   Special Requests NONE  Final   Acid Fast Smear   Final    NO ACID FAST BACILLI SEEN   Culture   Final    CULTURE WILL BE EXAMINED FOR 6 WEEKS BEFORE ISSUING A FINAL REPORT   Report Status PENDING  Incomplete  Body fluid culture     Status: None (Preliminary result)   Collection Time: 06/26/15 11:54 PM  Result Value Ref Range Status   Specimen Description FLUID PLEURAL RIGHT  Final   Special Requests NONE  Final   Gram Stain   Final    FEW WBC PRESENT, PREDOMINANTLY MONONUCLEAR NO ORGANISMS SEEN   Culture   Final    NO GROWTH 2 DAYS  Report Status PENDING  Incomplete  Fungus Culture with Smear     Status: None (Preliminary result)   Collection Time: 06/26/15 11:54 PM  Result Value Ref Range Status   Specimen Description PLEURAL  Final   Special Requests NONE  Final   Fungal Smear   Final    NO YEAST OR FUNGAL ELEMENTS SEEN   Culture   Final    CULTURE IN PROGRESS FOR FOUR WEEKS   Report Status PENDING  Incomplete  MRSA PCR Screening     Status: None   Collection Time: 06/27/15  3:16 AM  Result Value Ref Range Status   MRSA by PCR NEGATIVE NEGATIVE Final  Urine culture     Status: None   Collection Time: 06/27/15  6:03 PM  Result Value Ref Range Status   Specimen Description URINE, CATHETERIZED  Final   Culture   Final    NO GROWTH 1 DAY   Report Status 06/28/2015 FINAL  Final   Scheduled Meds: . dexamethasone  0.5 mg Oral BID  . nystatin  5 mL Oral QID  . OxyCODONE  10 mg Oral Q12H  . pantoprazole  40 mg Oral Daily  . polyethylene glycol  17 g Oral Daily  . QUEtiapine  12.5 mg Oral QHS  . senna-docusate  2 tablet Oral BID  . cyanocobalamin  2,000 mcg Oral BID

## 2015-06-29 NOTE — Progress Notes (Signed)
SLP Cancellation Note  Patient Details Name: Dashanae Longfield MRN: [id number] DOB: 11-19-1927   Cancelled treatment:       Reason Eval/Treat Not Completed: Other (comment) (note plans to focus on supportive care, SLP to sign off, please reorder if indicated or desire)   Luanna Salk, [facility] SLP [phone]

## 2015-06-30 LAB — BODY FLUID CULTURE: Culture: NO GROWTH

## 2015-06-30 LAB — ANA, BODY FLUID: Anti-Nuclear Ab, IgG: NOT DETECTED

## 2015-06-30 LAB — ADENOSIDE DEAMINASE, PLEURAL FL: ADENOSIDE DEAMINASE, PLEURAL FL: <1.6 U/L (ref 0.0–9.4)

## 2015-06-30 NOTE — Progress Notes (Signed)
CSW received referral that pt admitted from [facility].   CSW reviewed chart and noted that Palliative Medicine Goals of Care held with pt daughter. Per PMT GOC, pt daughter planned to visit [facility] yesterday evening to make decision regarding disposition. CSW spoke with PMT NP, Wadie Lessen who stated she left a message with pt daughter this morning to follow up regarding disposition wishes.   CSW to await guidance about plan in order to make appropriate referrals.  CSW to continue to follow.  Alison Murray, MSW, [place] [facility] [phone]

## 2015-06-30 NOTE — Progress Notes (Signed)
Oncology Short Note  Visited with Ashley Green in her room today. She appeared to be resting comfortably. Notes no acute pain at this time. Notes that she ate some food. No acute shortness of breath. Appears peaceful with significant improvement in her anxiety level. Had previously discussed with the daughter regarding goals of care and received consent from her daughter to pursue residential hospice. Appreciate palliative care team, social worker and case manager's input as well as help from the hospitalist team. Please call if any additional questions arise. It is a privilege to take care of this amazing lady.  (Non billable encounter)  Sullivan Lone MD [place] [facility]/Oncology Physician [facility]

## 2015-06-30 NOTE — Progress Notes (Signed)
Daily Progress Note   Patient Name: Ashley Green       Date: 06/30/2015 DOB: 1928/02/03  Age: 81 y.o. MRN#: [id number] Attending Physician: Theodis Blaze, MD Primary Care Physician: [name], DO Admit Date: 06/26/2015  Reason for Consultation/Follow-up: Establishing goals of care, Non pain symptom management, Pain control and Psychosocial/spiritual support  Subjective:     -spoke with Margarita/daughter by telephone regarding natural trajectory and expectations at EOL  -she expresses her sadness but understands the limited prognosis and her hope that her mother spends her EOL at a hospice facility.  She tells me she went to [facility] yesterday for a visit.   Will write for SW to offer choice   Length of Stay: 4 days  Current Medications: Scheduled Meds:  . antiseptic oral rinse  7 mL Mouth Rinse q12n4p  . chlorhexidine  15 mL Mouth Rinse BID  . dexamethasone  0.5 mg Oral BID  . feeding supplement  1 Container Oral TID BM  . nystatin  5 mL Oral QID  . OxyCODONE  10 mg Oral Q12H  . pantoprazole  40 mg Oral Daily  . polyethylene glycol  17 g Oral Daily  . QUEtiapine  12.5 mg Oral QHS  . senna-docusate  2 tablet Oral BID  . sodium chloride  3 mL Intravenous Q12H  . cyanocobalamin  2,000 mcg Oral BID    Continuous Infusions:    PRN Meds: sodium chloride, acetaminophen, alum & mag hydroxide-simeth, guaiFENesin-dextromethorphan, hydrocortisone, HYDROmorphone HCl, morphine CONCENTRATE, ondansetron, sodium chloride  Palliative Performance Scale: 20%     Vital Signs: BP 99/50 mmHg  Pulse 94  Temp(Src) 99 F (37.2 C) (Oral)  Resp 22  Ht '4\' 11"'$  (8.527 m)  Wt 44.453 kg (98 lb)  BMI 19.78 kg/m2  SpO2 94% SpO2: SpO2: 94 % O2 Device: O2 Device: Not Delivered O2 Flow Rate: O2 Flow Rate (L/min): 2 L/min  Intake/output summary:  Intake/Output Summary (Last 24 hours) at 06/30/15 1328 Last data filed at 06/30/15 0900  Gross per 24 hour  Intake      3 ml  Output     300 ml  Net   -297 ml   LBM: Last BM Date: 06/28/15 Baseline Weight: Weight: 44.453 kg (98 lb) Most recent weight: Weight: 44.453 kg (98 lb)  Physical Exam:  General: ill appearing frail, cachetic, less responsive  HEENT: dry buccal membranes CVS: RRR Resp: decreased in bases Abd: tenderness on gentle palpation, noted colostomy Extrem: without edema Skin: warm and dry Neuro: lethargic    Additional Data Reviewed: No results for input(s): WBC, HGB, PLT, NA, BUN, CREATININE in the last 72 hours.  Invalid input(s): ALB   Problem List:  Patient Active Problem List   Diagnosis Date Noted  . Cancer associated pain 06/29/2015  . Palliative care encounter 06/29/2015  . Pain, cancer 06/29/2015  . Weakness generalized 06/29/2015  . Goals of care, counseling/discussion   . Pressure ulcer 06/27/2015  . Protein-calorie malnutrition, severe 06/27/2015  . Anemia in neoplastic disease 06/26/2015  . Acute respiratory failure with hypoxia ([place])   . Adenocarcinoma, lung ([place]) 06/06/2015  . 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 Dysphagia 04/03/2015  . Hyperlipidemia 10/15/2012     Palliative Care Assessment & Plan    Code Status:  DNR  Goals of Care:   Focus of care is comfort, quality and dignity. No further life prolonging intervetnions   Symptom Management:  Pain/Dyspnea: Roxanol 5 mg every 2 hrs prn  Discussed with nursing importance of assessment, patient only speaks Spanish and needs assessment in Spanish to enhance pain control   Prognosis: < 2 weeks  Continued physical, functional and cognitive decline.  Sips of fluid only for intake.  Need for impeccable assessment of pain due to language barrier, patient will benefit from hospice facility  5. Discharge Planning: Hospice facility   Care plan was discussed with Hoy Finlay LCSW  Thank you for allowing the Palliative Medicine Team to assist in the care of this patient.   Time In: 1330 Time  Out: 1400 Total Time 30 min Prolonged Time Billed  no     Greater than 50%  of this time was spent counseling and coordinating care related to the above assessment and plan.     Knox Royalty, NP  06/30/2015, 1:28 PM  Please contact Palliative Medicine Team phone at [phone] for questions and concerns.

## 2015-06-30 NOTE — Clinical Social Work Note (Signed)
Clinical Social Work Assessment  Patient Details  Name: Ashley Green MRN: [id number] Date of Birth: Mar 20, 1928  Date of referral:  06/30/15               Reason for consult:  Discharge Planning                Permission sought to share information with:  Family Supports Permission granted to share information::     Name::     Beryle Flock  Agency::     Relationship::  daughter  Contact Information:  [phone]  Housing/Transportation Living arrangements for the past 2 months:  [place] of Information:  Adult Children Patient Interpreter Needed:  None Criminal Activity/Legal Involvement Pertinent to Current Situation/Hospitalization:  No - Comment as needed Significant Relationships:  Adult Children Lives with:  Facility Resident Do you feel safe going back to the place where you live?  No Need for family participation in patient care:  Yes (Comment)  Care giving concerns:  Pt admitted from [facility] Palliative Medicine Team spoke with pt daughter regarding goals of care and pt daughter wishing for comfort measures and agreeable to residential hospice.   Social Worker assessment / plan:  CSW received notification from PMT NP, Wadie Lessen that pt daughter notified her that she wishes to pursue residential hospice for pt.   CSW contacted pt daughter, [name] via telephone. CSW introduced self and explained role. CSW discussed residential hospice and pt daughter wants [facility].   CSW notified [facility] liaison, Erling Conte of referral. CSW to await response from [facility] regarding availability.   CSW to continue to follow to provide support and assist with pt transition to residential hospice when pt medically stable for discharge.  Employment status:  Retired Forensic scientist:  Medicaid In [place] PT Recommendations:  Not assessed at this time Information / Referral to community resources:  Other (Comment Required)  (residential hospice placement)  Patient/Family's Response to care:  Per chart, pt oriented to person only. Pt daughter agreeable to plan for [facility].  Patient/Family's Understanding of and Emotional Response to Diagnosis, Current Treatment, and Prognosis:  Pt daughter aware of pt poor prognosis and hopeful that [facility] can accept pt.  Emotional Assessment Appearance:  Appears stated age Attitude/Demeanor/Rapport:  Unable to Assess (oriented to person) Affect (typically observed):  Unable to Assess (oriented to person) Orientation:  Oriented to Self Alcohol / Substance use:  Not Applicable Psych involvement (Current and /or in the community):  No (Comment)  Discharge Needs  Concerns to be addressed:  Discharge Planning Concerns Readmission within the last 30 days:  No Current discharge risk:  Terminally ill Barriers to Discharge:  No Barriers Identified   [place] , [place], LCSW 06/30/2015, 2:49 PM [phone]

## 2015-06-30 NOTE — Progress Notes (Signed)
Patient ID: Ashley Green, female   DOB: 01/14/1928, 81 y.o.   MRN: [id number]  [facility] PROGRESS NOTE  Ashley Green BTD:[id number] DOB: May 20, 1928 DOA: 06/26/2015 PCP: [name], DO   Brief narrative:    81 y.o. 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 Upon initial evaluation, the patient was noted to have a significant right-sided pleural effusion.  Assessment/Plan:    Principal Problem:   Acute on chronic respiratory failure (HCC) with hypoxia (HCC) secondary to right sided malignant pleural effusion in the setting of sarcomatoid carcinoma of lung ([place]) - Patient has indicated that she wishes to have comfort measures at this time. - Underwent a 1.5 L therapeutic thoracentesis 06/26/15. - will consider a palliative Pleurx catheter if she has rapid reaccumulation of fluid. - Palliative care team assisting with discharge planning, ? 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, mediastinal adenopathy - known metastasis to bilateral adrenal glands, paraspinal muscles and pancreas, abdominal lymph nodes and skin - appreciate oncologist assistance - PCT consulted, awaiting for family decision on d/c planning     Cancer associated pain, acute on chronic  - Painful metastases to the spinal muscles over upper back - continue pain management efforts  - appreciate PCT assistance     Acute encephalopathy - confused this AM and secondary to progressive decline and FTT - d/c planning in progress     Dysphagia - likely from extrinsic compression from right upper lobe lung mass - evaluated by speech  therapist 06/27/15. - full liquid diet allowed if pt able to tolerate    Hypertension - Blood pressure on the low side - Not on antihypertensives at this time   Hyperlipidemia - No need for further treatment given overall prognosis   Anemia in neoplastic disease, microcytic  - no further blood work needed to ensure comfort - no indication for transfusion, last Hg 10/04 was 8.6 - no signs of active bleeding    Pressure ulcer - Skin care per nursing staff. Frequent turning and positioning recommended.   Protein-calorie malnutrition, severe, with hypoalbuminemia - Evaluated by dietitian 06/27/15. Comfort feeds.    Grade 1 oral mucositis due to Nivolumab  - improving with mouth washes.  DVT prophylaxis - Lovenox SQ  Code Status: DNR Family Communication:  No family at bedside  Disposition Plan: will go back to [facility] vs residential hospice  IV access:  Peripheral IV  Procedures and diagnostic studies:    Dg Chest 2 View 06/26/2015  Interval development of large right pleural effusion.  Stable to decreased in size right peritracheal upper lobe known lung cancer.     Ct Head Wo Contrast 06/06/2015   No acute intracranial abnormality. 2. Mild cerebral atrophy and small vessel ischemic change.    Ct Chest Wo Contrast 06/26/2015  Relatively stable in size right upper lobe pulmonary mass, with interval development of large loculated right pleural effusion and severe compressive atelectasis of the remaining of the right lung parenchyma.  Mild leftward mediastinal shift.  Mild pericardial thickening.  Interval development of small left pleural effusion and an 8 mm. peridiaphragmatic left lower lobe soft tissue nodule.  Interval enlargement of bilateral adrenal masses, with the  right adrenal mass, which is incompletely visualized, measuring up to 6 cm.  Anterior mesenteric metastatic nodule.  Right abdominal wall 1.7 cm metastatic nodule.  Interval enlargement of left  perispinal soft tissue metastatic masses, with osteolytic changes of the pedicles and left vertebral bodies of T7 and T9.    Dg Chest Port 1 View 06/27/2015   Residual small right pleural effusion noted status post thoracentesis, with underlying right-sided airspace opacification. This may reflect asymmetric pulmonary edema or pneumonia. The patient's right upper lobe lung mass is again noted.     Dg Abd Acute W/chest 06/06/2015   Prominent stool throughout the colon suggesting constipation. 2. Stable right upper lobe lung mass. 3. No acute findings identified.    Medical Consultants:  Oncology PCT  Other Consultants:  None  IAnti-Infectives:   None  Faye Ramsay, MD  [facility] Pager [phone]  If 7PM-7AM, please contact night-coverage [email or web address] Password [email or web address] 06/30/2015, 1:03 PM   LOS: 4 days   HPI/Subjective: No events overnight. Confused this AM  Objective: Filed Vitals:   06/29/15 1428 06/29/15 2105 06/30/15 0429 06/30/15 1303  BP: 109/51 1'20/60 98/44 99/50 '$  Pulse: 96 100 96 94  Temp: 97.4 F (36.3 C) 98.3 F (36.8 C) 99.1 F (37.3 C) 99 F (37.2 C)  TempSrc: Axillary Axillary Axillary Oral  Resp: '20 20 24 22  '$ Height:      Weight:      SpO2: 96% 94% 93% 94%    Intake/Output Summary (Last 24 hours) at 06/30/15 1303 Last data filed at 06/30/15 0900  Gross per 24 hour  Intake      3 ml  Output    300 ml  Net   -297 ml    Exam:   General:  Pt is alert but confused, NAD   Cardiovascular: Regular rate and rhythm, no rubs, no gallops  Respiratory: Clear to auscultation bilaterally, diminished breath sounds at bases   Abdomen: Soft, non tender, non distended, bowel sounds present, no guarding  Data Reviewed: Basic Metabolic Panel:  Recent Labs Lab 06/26/15 1440 06/27/15 0340  NA 139 138  K 4.3 4.3  CL  --  102  CO2 23 24  GLUCOSE 132 111*  BUN 16.4 18  CREATININE 0.6 0.49  CALCIUM 8.6 7.9*   Liver Function Tests:  Recent Labs Lab  06/26/15 1440 06/26/15 2358 06/27/15 0340  AST 20  --  28  ALT 15  --  15  ALKPHOS 97  --  78  BILITOT <0.30  --  0.3  PROT 5.9* 5.9* 5.6*  ALBUMIN 1.7*  --  1.8*   CBC:  Recent Labs Lab 06/26/15 1440 06/27/15 0340  WBC 11.9* 10.7*  NEUTROABS 10.5* 9.6*  HGB 9.8* 8.6*  HCT 29.8* 25.6*  MCV 72.7* 74.0*  PLT 342 344   Recent Results (from the past 240 hour(s))  AFB culture with smear     Status: None (Preliminary result)   Collection Time: 06/26/15 11:54 PM  Result Value Ref Range Status   Specimen Description PLEURAL  Final   Special Requests NONE  Final   Acid Fast Smear   Final    NO ACID FAST BACILLI SEEN   Culture   Final    CULTURE WILL BE EXAMINED FOR 6 WEEKS BEFORE ISSUING A FINAL REPORT   Report Status PENDING  Incomplete  Body fluid culture     Status: None (Preliminary result)   Collection Time: 06/26/15 11:54 PM  Result Value Ref Range Status  Specimen Description FLUID PLEURAL RIGHT  Final   Special Requests NONE  Final   Gram Stain   Final    FEW WBC PRESENT, PREDOMINANTLY MONONUCLEAR NO ORGANISMS SEEN   Culture   Final    NO GROWTH 2 DAYS   Report Status PENDING  Incomplete  Fungus Culture with Smear     Status: None (Preliminary result)   Collection Time: 06/26/15 11:54 PM  Result Value Ref Range Status   Specimen Description PLEURAL  Final   Special Requests NONE  Final   Fungal Smear   Final    NO YEAST OR FUNGAL ELEMENTS SEEN   Culture   Final    CULTURE IN PROGRESS FOR FOUR WEEKS   Report Status PENDING  Incomplete  MRSA PCR Screening     Status: None   Collection Time: 06/27/15  3:16 AM  Result Value Ref Range Status   MRSA by PCR NEGATIVE NEGATIVE Final  Urine culture     Status: None   Collection Time: 06/27/15  6:03 PM  Result Value Ref Range Status   Specimen Description URINE, CATHETERIZED  Final   Culture   Final    NO GROWTH 1 DAY   Report Status 06/28/2015 FINAL  Final   Scheduled Meds: . dexamethasone  0.5 mg Oral BID   . nystatin  5 mL Oral QID  . OxyCODONE  10 mg Oral Q12H  . pantoprazole  40 mg Oral Daily  . polyethylene glycol  17 g Oral Daily  . QUEtiapine  12.5 mg Oral QHS  . senna-docusate  2 tablet Oral BID  . cyanocobalamin  2,000 mcg Oral BID

## 2015-07-01 DIAGNOSIS — J9620 Acute and chronic respiratory failure, unspecified whether with hypoxia or hypercapnia: Secondary | ICD-10-CM | POA: Insufficient documentation

## 2015-07-01 DIAGNOSIS — F05 Delirium due to known physiological condition: Secondary | ICD-10-CM | POA: Insufficient documentation

## 2015-07-01 MED ORDER — HYDROMORPHONE HCL 1 MG/ML PO LIQD: 0.5000 mg | ORAL | Status: AC | PRN

## 2015-07-01 MED ORDER — OXYCODONE HCL ER 10 MG PO T12A: 10.0000 mg | EXTENDED_RELEASE_TABLET | Freq: Two times a day (BID) | ORAL | Status: AC

## 2015-07-01 MED ORDER — QUETIAPINE FUMARATE 25 MG PO TABS: 12.5000 mg | ORAL_TABLET | Freq: Every day | ORAL | Status: AC

## 2015-07-01 MED ORDER — GUAIFENESIN-DM 100-10 MG/5ML PO SYRP: 10.0000 mL | ORAL_SOLUTION | ORAL | Status: AC | PRN

## 2015-07-01 MED ORDER — MORPHINE SULFATE (CONCENTRATE) 10 MG/0.5ML PO SOLN: 5.0000 mg | ORAL | Status: AC | PRN

## 2015-07-01 MED ORDER — HYDROCORTISONE 2.5 % RE CREA: 1.0000 "application " | TOPICAL_CREAM | Freq: Two times a day (BID) | RECTAL | Status: AC | PRN

## 2015-07-01 NOTE — Discharge Instructions (Signed)
Sndrome de dificultad respiratoria aguda (Acute Respiratory Distress Syndrome) El sndrome de dificultad respiratoria aguda es una afeccin potencialmente mortal en la que se acumula lquido en los pulmones. Esta afeccin puede causar una gran falta de aire o niveles bajos de oxgeno en la sangre. Tambin puede [employer] pulmones y otros rganos vitales funcionen mal. Por lo general, la afeccin aparece de 24a 48horas despus de una infeccin, enfermedad, ciruga o lesin. CAUSAS Esta afeccin puede ser causada por lo siguiente:  Una infeccin, como sepsis o neumona.  Una lesin grave en la cabeza o en el pecho.  Clementeen Hoof mayor.  Una sobredosis de drogas.  La inhalacin de humo o sustancias qumicas perjudiciales.  Transfusiones de [place].  Un cogulo sanguneo en los pulmones. SNTOMAS Los sntomas de esta afeccin incluyen lo siguiente:  Cristy Hilts.  Dificultad para respirar.  Piel azulada (cianosis).  Latidos cardacos rpidos o irregulares.  Presin arterial baja (hipotensin).  Agitacin.  Confusin.  Falta de energa Engineer, manufacturing).  Sudoracin. DIAGNSTICO Esta afeccin puede diagnosticarse mediante estudios que descartan otras enfermedades y trastornos con sntomas similares. Algunos de los estudios que probablemente deba tener que realizarse son los siguientes:  Una radiografa o tomografa computarizada del trax.  Anlisis de [place].  Un cultivo de esputo. En [employer], le tomarn una muestra de saliva para Physiological scientist el lquido pulmonar.  Broncoscopia. En [employer], se introduce una herramienta delgada y flexible por la boca o la nariz, que atraviesa la trquea y llega a los pulmones. TRATAMIENTO El tratamiento de esta afeccin puede incluir lo siguiente:  Oxgeno. A menudo, se utiliza una mquina para respirar [place]ar[place] manager) para Geneticist, molecular oxgeno y ayudar con la respiracin.  Un medicamento como ayuda para relajarse (sedante).  Lquidos y  nutrientes a travs de una va intravenosa (IV).  Medicamentos para la presin arterial.  Corticoides para disminuir la inflamacin en los pulmones.  Diurticos para eliminar el exceso de lquido del cuerpo. Segn la causa de la afeccin, puede ser necesario un tratamiento adicional. La recuperacin puede llevar hasta 55mses. Algunas personas se recuperan por completo, pero otras pueden seguir teniendo estos problemas:  Debilidad.  Falta de aire.  Problemas de memoria.  Depresin. INSTRUCCIONES PARA EL CUIDADO EN EL HOGAR Hasta que se recupere de esta afeccin, debe hacer lo siguiente:  No fume.  Limite el consumo de alcohol a no ms de 1 medida por da si es mujer y no est eMusic therapist y 2 medidas si es hombre. Una medida equivale a 12onzas de cerveza, 5onzas de vino o 1onzas de bebidas alcohlicas de alta graduacin.  Pdales a sus amigos y familiares que lo ayuden si las actividades cotidianas le producen cansancio. SOLICITE ATENCIN MDICA SI:  LMarin Commentfalta el aire al hacer una actividad o estar en reposo.  Tiene tos que no desaparece.  Tiene fiebre. SOLICITE ATENCIN MDICA DE INMEDIATO SI:  Le falta el aire de forma repentina.  Siente un dolor en el pecho que no desaparece.  Presenta dolor o hinchazn en una de las piernas.  Tose y escupe sangre.   Esta informacin no tiene cMarine scientistel consejo del mdico. Asegrese de hacerle al mdico cualquier pregunta que tenga.   Document Released: 12/06/2008 Document Revised: 01/24/2015 Elsevier Interactive Patient Education [employer]

## 2015-07-01 NOTE — Care Management Note (Signed)
Case Management Note  Patient Details  Name: Ashley Green MRN: [id number] Date of Birth: Sep 09, 1928  Subjective/Objective:         Lung cancer           Action/Plan: Scheduled dc to [facility]. No NCM needs identified.   Expected Discharge Date:  07/01/2015              Expected Discharge Plan:  [place]  In-House Referral:  Clinical Social Work  Discharge planning Services  CM Consult  Status of Service:  Completed, signed off  Medicare Important Message Given:    Date Medicare IM Given:    Medicare IM give by:    Date Additional Medicare IM Given:    Additional Medicare Important Message give by:     If discussed at [place] of Stay Meetings, dates discussed:    Additional Comments:  Erenest Rasher, RN 07/01/2015, 10:59 AM

## 2015-07-01 NOTE — Discharge Summary (Addendum)
Physician Discharge Summary  Katharyn Schauer YKD:[id number] DOB: [birth year] DOA: 06/26/2015  PCP: [name], DO  Admit date: 06/26/2015 Discharge date: 07/01/2015  Recommendations for Outpatient Follow-up:  Pt will be discharged to [facility] residential hospice  Discharge Diagnoses:  Principal Problem:   Acute respiratory failure with hypoxia ([place]) Active Problems:   Sarcomatoid carcinoma of lung (HCC)   Adenocarcinoma, lung ([place])   Cancer associated pain   Dysphagia   Pressure ulcer   Protein-calorie malnutrition, severe   Hyperlipidemia   Anemia in neoplastic disease   Goals of care, counseling/discussion   Palliative care encounter   Pain, cancer   Weakness generalized  Discharge Condition: Stable  Diet recommendation: Comfort feeding as tolerated, full liquid   Brief narrative:    81 y.o. 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 Upon initial evaluation, the patient was noted to have a significant right-sided pleural effusion.  Assessment/Plan:    Principal Problem:  Acute on chronic respiratory failure (HCC) with hypoxia (HCC) secondary to right sided malignant pleural effusion in the setting of sarcomatoid carcinoma of lung ([place]) - Patient has indicated that she wishes to have comfort measures at this time. - Underwent a 1.5 L therapeutic thoracentesis 06/26/15. 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, mediastinal adenopathy - known metastasis to bilateral adrenal glands, paraspinal muscles and pancreas, abdominal lymph nodes and  skin - appreciate oncologist assistance - PCT consulted, plan to d/c to residential hospice    Cancer associated pain, acute on chronic  - Painful metastases to the spinal muscles over upper back - continue pain management efforts  - appreciate PCT assistance    Acute encephalopathy - confused this AM and secondary to progressive decline and FTT   Dysphagia - likely from extrinsic compression from right upper lobe lung mass - evaluated by speech therapist 06/27/15. - full liquid diet allowed if pt able to tolerate    Hypertension - Blood pressure on the low side - Not on antihypertensives at this time   Hyperlipidemia - No need for further treatment given overall prognosis   Anemia in neoplastic disease, microcytic  - no further blood work needed to ensure comfort - no indication for transfusion, last Hg 10/04 was 8.6 - no signs of active bleeding    Skin breakdown in sacral area - high risk for pressure ulcer  - Skin care per nursing staff. Frequent turning and positioning recommended.   Protein-calorie malnutrition, severe, with hypoalbuminemia - Evaluated by dietitian 06/27/15. Comfort feeds.   Grade 1 oral mucositis due to Nivolumab  - improving with mouth washes.  Code Status: DNR Family Communication: No family at bedside  Disposition Plan: will go to residential hospice  IV access:  Peripheral IV  Procedures and diagnostic studies:   Dg Chest 2 View 06/26/2015 Interval development of large right pleural effusion. Stable to decreased in size right peritracheal upper lobe known lung cancer.   Ct Head Wo Contrast 06/06/2015 No acute intracranial abnormality. 2. Mild cerebral atrophy and small vessel ischemic change.   Ct Chest Wo Contrast 06/26/2015 Relatively stable in size right upper lobe pulmonary mass, with interval development of large loculated right  pleural effusion and severe compressive atelectasis of the remaining of the right  lung parenchyma. Mild leftward mediastinal shift. Mild pericardial thickening. Interval development of small left pleural effusion and an 8 mm. peridiaphragmatic left lower lobe soft tissue nodule. Interval enlargement of bilateral adrenal masses, with the right adrenal mass, which is incompletely visualized, measuring up to 6 cm. Anterior mesenteric metastatic nodule. Right abdominal wall 1.7 cm metastatic nodule. Interval enlargement of left perispinal soft tissue metastatic masses, with osteolytic changes of the pedicles and left vertebral bodies of T7 and T9.   Dg Chest Port 1 View 06/27/2015 Residual small right pleural effusion noted status post thoracentesis, with underlying right-sided airspace opacification. This may reflect asymmetric pulmonary edema or pneumonia. The patient's right upper lobe lung mass is again noted.   Dg Abd Acute W/chest 06/06/2015 Prominent stool throughout the colon suggesting constipation. 2. Stable right upper lobe lung mass. 3. No acute findings identified.   Medical Consultants:  Oncology PCT  Other Consultants:  None  IAnti-Infectives:   None      Discharge Exam: Filed Vitals:   06/30/15 2110  Pulse: 100  Temp: 97.5 F (36.4 C)  Resp: 26   Filed Vitals:   06/29/15 2105 06/30/15 0429 06/30/15 1303 06/30/15 2110  BP:  98/44 99/50 119/52  Pulse: 100 96 94 100  Temp: 98.3 F (36.8 C) 99.1 F (37.3 C) 99 F (37.2 C) 97.5 F (36.4 C)  TempSrc: Axillary Axillary Oral Oral  Resp: '20 24 22 26  '$ Height:      Weight:      SpO2: 94% 93% 94% 93%    General: Pt is somnolent, NAD Cardiovascular: Regular rhythm, tachycardic, no rubs, no gallops Respiratory: Clear to auscultation bilaterally, diminished breath sounds at bases  Abdominal: Soft, non tender, non distended, bowel sounds +   Discharge Instructions  Discharge Instructions    Diet - low sodium heart healthy    Complete by:  As directed      Increase activity  slowly    Complete by:  As directed             Medication List    STOP taking these medications        magnesium citrate Soln     mirtazapine 15 MG tablet  Commonly known as:  REMERON     moxifloxacin 400 MG tablet  Commonly known as:  AVELOX     NATURA STOMAHESIVE MOLDABLE Wafr      TAKE these medications        cyanocobalamin 2000 MCG tablet  Take 2,000 mcg by mouth 2 (two) times daily.     dexamethasone 0.5 MG/5ML solution  Commonly known as:  DECADRON  Take 5 mLs (0.5 mg total) by mouth 2 (two) times daily. Hold in the mouth and swish in the mouth for as long as possible     feeding supplement Liqd  Take 1 Container by mouth 3 (three) times daily between meals.     guaiFENesin-dextromethorphan 100-10 MG/5ML syrup  Commonly known as:  ROBITUSSIN DM  Take 10 mLs by mouth every 4 (four) hours as needed for cough.     hydrocortisone 2.5 % rectal cream  Commonly known as:  ANUSOL-HC  Place 1 application rectally 2 (two) times daily as needed for hemorrhoids.     HYDROmorphone HCl 1 MG/ML Liqd  Commonly known as:  DILAUDID  Take 0.5-1 mLs (0.5-1 mg total) by mouth every 3 (three) hours as needed for moderate pain or  severe pain.     morphine CONCENTRATE 10 MG/0.5ML Soln concentrated solution  Take 0.25 mLs (5 mg total) by mouth every 2 (two) hours as needed for severe pain (dyspnea).     nystatin 100000 UNIT/ML suspension  Commonly known as:  MYCOSTATIN  Take 5 mLs (500,000 Units total) by mouth 4 (four) times daily.     omeprazole 40 MG capsule  Commonly known as:  PRILOSEC  Take 1 capsule (40 mg total) by mouth daily.     ondansetron 4 MG disintegrating tablet  Commonly known as:  ZOFRAN-ODT  Take 1 tablet (4 mg total) by mouth every 8 (eight) hours as needed for nausea or vomiting.     OxyCODONE 10 mg T12a 12 hr tablet  Commonly known as:  OXYCONTIN  Take 1 tablet (10 mg total) by mouth every 12 (twelve) hours.     polyethylene glycol packet   Commonly known as:  MIRALAX / GLYCOLAX  Take 17 g by mouth daily.     QUEtiapine 25 MG tablet  Commonly known as:  SEROQUEL  Take 0.5 tablets (12.5 mg total) by mouth at bedtime.     senna-docusate 8.6-50 MG tablet  Commonly known as:  Senokot-S  Take 2 tablets by mouth 2 (two) times daily.          The results of significant diagnostics from this hospitalization (including imaging, microbiology, ancillary and laboratory) are listed below for reference.     Microbiology: Recent Results (from the past 240 hour(s))  AFB culture with smear     Status: None (Preliminary result)   Collection Time: 06/26/15 11:54 PM  Result Value Ref Range Status   Specimen Description PLEURAL  Final   Special Requests NONE  Final   Acid Fast Smear   Final    NO ACID FAST BACILLI SEEN Performed at [facility]    Culture   Final    CULTURE WILL BE EXAMINED FOR 6 WEEKS BEFORE ISSUING A FINAL REPORT Performed at [facility]    Report Status PENDING  Incomplete  Body fluid culture     Status: None   Collection Time: 06/26/15 11:54 PM  Result Value Ref Range Status   Specimen Description FLUID PLEURAL RIGHT  Final   Special Requests NONE  Final   Gram Stain   Final    FEW WBC PRESENT, PREDOMINANTLY MONONUCLEAR NO ORGANISMS SEEN    Culture   Final    NO GROWTH 3 DAYS Performed at [facility]    Report Status 06/30/2015 FINAL  Final  Fungus Culture with Smear     Status: None (Preliminary result)   Collection Time: 06/26/15 11:54 PM  Result Value Ref Range Status   Specimen Description PLEURAL  Final   Special Requests NONE  Final   Fungal Smear   Final    NO YEAST OR FUNGAL ELEMENTS SEEN Performed at [facility]    Culture   Final    CULTURE IN PROGRESS FOR FOUR WEEKS Performed at [facility]    Report Status PENDING  Incomplete  MRSA PCR Screening     Status: None   Collection Time: 06/27/15  3:16 AM  Result Value Ref Range Status    MRSA by PCR NEGATIVE NEGATIVE Final    Comment:        The GeneXpert MRSA Assay (FDA approved for NASAL specimens only), is one component of a comprehensive MRSA colonization surveillance program. It is not intended to diagnose  MRSA infection nor to guide or monitor treatment for MRSA infections.   Urine culture     Status: None   Collection Time: 06/27/15  6:03 PM  Result Value Ref Range Status   Specimen Description URINE, CATHETERIZED  Final   Special Requests NONE  Final   Culture   Final    NO GROWTH 1 DAY Performed at [facility]    Report Status 06/28/2015 FINAL  Final     Labs: Basic Metabolic Panel:  Recent Labs Lab 06/26/15 1440 06/27/15 0340  NA 139 138  K 4.3 4.3  CL  --  102  CO2 23 24  GLUCOSE 132 111*  BUN 16.4 18  CREATININE 0.6 0.49  CALCIUM 8.6 7.9*   Liver Function Tests:  Recent Labs Lab 06/26/15 1440 06/26/15 2358 06/27/15 0340  AST 20  --  28  ALT 15  --  15  ALKPHOS 97  --  78  BILITOT <0.30  --  0.3  PROT 5.9* 5.9* 5.6*  ALBUMIN 1.7*  --  1.8*   No results for input(s): LIPASE, AMYLASE in the last 168 hours. No results for input(s): AMMONIA in the last 168 hours. CBC:  Recent Labs Lab 06/26/15 1440 06/27/15 0340  WBC 11.9* 10.7*  NEUTROABS 10.5* 9.6*  HGB 9.8* 8.6*  HCT 29.8* 25.6*  MCV 72.7* 74.0*  PLT 342 344    BNP (last 3 results)  Recent Labs  06/06/15 1425  BNP 81.6    SIGNED: Time coordinating discharge: 30 minutes  MAGICK-Jazel Nimmons, MD  [facility] 07/01/2015, 8:37 AM Pager [phone]  If 7PM-7AM, please contact night-coverage [email or web address] Password [email or web address]

## 2015-07-01 NOTE — Progress Notes (Signed)
Patient discharged to [facility] via [facility], report called to [place] at [facility].

## 2015-07-01 NOTE — Consult Note (Signed)
[facility] Liaison: Received request from [facility] late afternoon 06/30/15. Chart reviewed and spoke with daughter by phone. Daughter agreeable to transfer Saturday morning but unable to come to hospital 06/30/15 to complete paperwork. Daughter requested to meet at 8:00 Saturday morning which is the only time she can meet due to her work. Agreed to meet her in patient's room at 8:00 Saturday. Daughter requesting [facility], Dr. Orpah Melter to assume care if patient transfers Saturday. Will update CSW after paperwork complete.   Will need discharge summary faxed to [phone].  RN please call report to [phone].  Thank you.  Erling Conte, [place]

## 2015-07-01 NOTE — Progress Notes (Signed)
Patient for d/c today to [facility]- daughter, [name], contacted by phone and is agreeable to this plan- plan transfer via EMS. Eduard Clos, MSW, Latanya Presser [phone]

## 2015-07-07 ENCOUNTER — Other Ambulatory Visit: Payer: Self-pay | Admitting: *Deleted

## 2015-07-07 DIAGNOSIS — C3490 Malignant neoplasm of unspecified part of unspecified bronchus or lung: Secondary | ICD-10-CM

## 2015-07-10 ENCOUNTER — Ambulatory Visit: Payer: Medicaid Other

## 2015-07-10 ENCOUNTER — Other Ambulatory Visit: Payer: Medicaid Other

## 2015-07-10 ENCOUNTER — Ambulatory Visit: Payer: Medicaid Other | Admitting: Hematology

## 2015-07-23 LAB — FUNGUS CULTURE W SMEAR: Fungal Smear: NONE SEEN

## 2015-08-09 LAB — AFB CULTURE WITH SMEAR (NOT AT ARMC): Acid Fast Smear: NONE SEEN

## 2015-09-20 ENCOUNTER — Other Ambulatory Visit: Payer: Self-pay | Admitting: Nurse Practitioner

## 2015-11-09 ENCOUNTER — Telehealth: Payer: Self-pay | Admitting: *Deleted

## 2015-11-09 NOTE — Telephone Encounter (Signed)
Called patient to offer flu vaccine via [address], Skip [place], [place], however, VM picked up.  Left message on patient's voice mail to return call. Velora Heckler, RN

## 2015-12-15 NOTE — Progress Notes (Signed)
Patient ID: Ashley Green, female   DOB: [birth year], 82 y.o.   MRN: [id number]    HISTORY AND PHYSICAL   DATE: 06/15/15  Location:  [facility]    Place of Service: SNF [id number])   Extended Emergency Contact Information Primary Emergency Contact: Delgado,Marqarita Address: [address]          [place], [place] [address] Johnnette Litter of [place] Phone: [phone] Relation: Daughter  Advanced Directive information  DNR  Chief Complaint  Patient presents with  . 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 She rec'd chemotx and XRT. Albumin 3.1. Na 123-->132. Hgb 7.3 and transfused 2 units PRBCs --> 9.8. Mental status improved. She presents to SNF for short term rehab.  She is nonEnglish speaking. Nursing reports unwitnessed fall last night. No obvious injury. No po intake per nursing. She has no c/o. Pain controlled. Unable to obtain further HPI due to mental status. Hx obtained from chart and nursing. She is followed by H/O and rad Onc. Takes omeprazole for GERD. She is on 2 cal TID. She has a colostomy  Past Medical History  Diagnosis Date  . Hypertension   . Colon cancer ([place])   . Bowel obstruction ([place])   . GERD (gastroesophageal reflux disease) 10/15/2012  . Adenocarcinoma, lung [facility])     Past Surgical History  Procedure Laterality Date  . Colon surgery    . Colectomy  s/p colon cancer 2000    . Colostomy      Patient Care Team: Lorna Few, DO as PCP - General  Social History   Social History  . Marital Status: Single    Spouse Name: N/A  . Number of Children: 3  . Years of Education: N/A   Occupational History  . retired    Social History Main Topics  . Smoking status: Never Smoker   . Smokeless  tobacco: Never Used  . Alcohol Use: No  . Drug Use: No  . Sexual Activity: No   Other Topics Concern  . Not on file   Social History Narrative   Works at [employer].      reports that she has never smoked. She has never used smokeless tobacco. She reports that she does not drink alcohol or use illicit drugs.  Family History  Problem Relation Age of Onset  . Hodgkin's lymphoma Son   . Heart attack Mother   . Brain cancer Sister   . Pneumonia Father   . Alcoholism Brother   . Pneumonia Brother    No family status information on file.     There is no immunization history on file for this patient.  No Known Allergies  Medications: Patient's Medications  New Prescriptions   GUAIFENESIN-DEXTROMETHORPHAN (ROBITUSSIN DM) 100-10 MG/5ML SYRUP    Take 10 mLs by mouth every 4 (four) hours as needed for cough.   HYDROCORTISONE (ANUSOL-HC) 2.5 % RECTAL CREAM    Place 1 application rectally 2 (two) times daily as needed for hemorrhoids.   HYDROMORPHONE HCL (DILAUDID) 1 MG/ML LIQD    Take 0.5-1 mLs (0.5-1 mg total) by mouth every 3 (three) hours as needed for moderate pain or severe pain.   MORPHINE SULFATE (MORPHINE CONCENTRATE) 10 MG/0.5ML SOLN CONCENTRATED SOLUTION    Take 0.25  mLs (5 mg total) by mouth every 2 (two) hours as needed for severe pain (dyspnea).   OXYCODONE (OXYCONTIN) 10 MG T12A 12 HR TABLET    Take 1 tablet (10 mg total) by mouth every 12 (twelve) hours.   QUETIAPINE (SEROQUEL) 25 MG TABLET    Take 0.5 tablets (12.5 mg total) by mouth at bedtime.  Previous Medications   CYANOCOBALAMIN 2000 MCG TABLET    Take 2,000 mcg by mouth 2 (two) times daily.   DEXAMETHASONE (DECADRON) 0.5 MG/5ML SOLUTION    Take 5 mLs (0.5 mg total) by mouth 2 (two) times daily. Hold in the mouth and swish in the mouth for as long as possible   FEEDING SUPPLEMENT (BOOST / RESOURCE BREEZE) LIQD    Take 1 Container by mouth 3 (three) times daily between meals.   NYSTATIN (MYCOSTATIN)  100000 UNIT/ML SUSPENSION    Take 5 mLs (500,000 Units total) by mouth 4 (four) times daily.   OMEPRAZOLE (PRILOSEC) 40 MG CAPSULE    Take 1 capsule (40 mg total) by mouth daily.   ONDANSETRON (ZOFRAN-ODT) 4 MG DISINTEGRATING TABLET    Take 1 tablet (4 mg total) by mouth every 8 (eight) hours as needed for nausea or vomiting.   POLYETHYLENE GLYCOL (MIRALAX / GLYCOLAX) PACKET    Take 17 g by mouth daily.   SENNA-DOCUSATE (SENOKOT-S) 8.6-50 MG PER TABLET    Take 2 tablets by mouth 2 (two) times daily.  Modified Medications   No medications on file  Discontinued Medications   MAGNESIUM CITRATE SOLN    Take 0.5 Bottles by mouth daily as needed (constipation).    MIRTAZAPINE (REMERON) 15 MG TABLET    Take 1 tablet (15 mg total) by mouth at bedtime.   MORPHINE (ROXANOL) 20 MG/ML CONCENTRATED SOLUTION    Take 0.25 mLs (5 mg total) by mouth every 4 (four) hours as needed for severe pain or breakthrough pain.   OSTOMY SUPPLIES (NATURA STOMAHESIVE MOLDABLE) WAFR    1 each by Does not apply route 2 (two) times daily.   OXYCODONE (OXYCONTIN) 10 MG T12A 12 HR TABLET    Take 1 tablet (10 mg total) by mouth every 12 (twelve) hours.    Review of Systems  Unable to perform ROS: Other  language barrier/mental status  Filed Vitals:   06/15/15 1133  BP: 124/67  Pulse: 84  Temp: 98.3 F (36.8 C)  Weight: 100 lb (45.36 kg)   Body mass index is 20.19 kg/(m^2).  Physical Exam  Constitutional: She appears well-developed. No distress.  Sitting in w/c in NAD. Frail appearing  HENT:  Mouth/Throat: Oropharynx is clear and moist. No oropharyngeal exudate.  MM dry  Eyes: Pupils are equal, round, and reactive to light. No scleral icterus.  Neck: Neck supple. Carotid bruit is not present. No tracheal deviation present. No thyromegaly present.  Cardiovascular: Normal rate, regular rhythm, normal heart sounds and intact distal pulses.  Exam reveals no gallop and no friction rub.   No murmur heard. +1 pitting BLE  edema. No calf TTP  Pulmonary/Chest: Effort normal and breath sounds normal. No stridor. No respiratory distress. She has no wheezes. She has no rales.  Abdominal: Soft. Bowel sounds are normal. She exhibits no distension and no mass. There is no hepatomegaly. There is no tenderness. There is no rebound and no guarding.  Colostomy intact  Musculoskeletal: She exhibits edema.  Lymphadenopathy:    She has no cervical adenopathy.  Neurological: She is alert.  Skin: Skin is  warm and dry. No rash noted.  Psychiatric: She has a normal mood and affect. Her behavior is normal.     Labs reviewed: Admission on 06/06/2015, Discharged on 06/09/2015  Component Date Value Ref Range Status  . Sodium, Ur 06/06/2015 12   Final   Performed at [facility]  . Osmolality 06/06/2015 260* 275 - 300 mOsm/kg Final   Performed at [facility]  . Free T4 06/06/2015 0.94  0.61 - 1.12 ng/dL Final   Performed at [facility]  . B Natriuretic Peptide 06/06/2015 81.6  0.0 - 100.0 pg/mL Final  . Sodium 06/06/2015 123* 135 - 145 mmol/L Final  . Potassium 06/06/2015 4.4  3.5 - 5.1 mmol/L Final  . Chloride 06/06/2015 89* 101 - 111 mmol/L Final  . CO2 06/06/2015 23  22 - 32 mmol/L Final  . Glucose, Bld 06/06/2015 133* 65 - 99 mg/dL Final  . BUN 06/06/2015 14  6 - 20 mg/dL Final  . Creatinine, Ser 06/06/2015 0.51  0.44 - 1.00 mg/dL Final  . Calcium 06/06/2015 8.0* 8.9 - 10.3 mg/dL Final  . GFR calc non Af Amer 06/06/2015 >60  >60 mL/min Final  . GFR calc Af Amer 06/06/2015 >60  >60 mL/min Final   Comment: (NOTE) The eGFR has been calculated using the CKD EPI equation. This calculation has not been validated in all clinical situations. eGFR's persistently <60 mL/min signify possible Chronic Kidney Disease.   . Anion gap 06/06/2015 11  5 - 15 Final  . WBC 06/06/2015 12.0* 4.0 - 10.5 K/uL Final  . RBC 06/06/2015 3.48* 3.87 - 5.11 MIL/uL Final  . Hemoglobin 06/06/2015 8.5* 12.0 - 15.0 g/dL  Final  . HCT 06/06/2015 24.7* 36.0 - 46.0 % Final  . MCV 06/06/2015 71.0* 78.0 - 100.0 fL Final  . MCH 06/06/2015 24.4* 26.0 - 34.0 pg Final  . MCHC 06/06/2015 34.4  30.0 - 36.0 g/dL Final  . RDW 06/06/2015 18.7* 11.5 - 15.5 % Final  . Platelets 06/06/2015 334  150 - 400 K/uL Final  . Osmolality, Ur 06/06/2015 338* 390 - 1090 mOsm/kg Final   Performed at [facility]  . Color, Urine 06/06/2015 YELLOW  YELLOW Final  . APPearance 06/06/2015 CLEAR  CLEAR Final  . Specific Gravity, Urine 06/06/2015 1.013  1.005 - 1.030 Final  . pH 06/06/2015 6.0  5.0 - 8.0 Final  . Glucose, UA 06/06/2015 NEGATIVE  NEGATIVE mg/dL Final  . Hgb urine dipstick 06/06/2015 NEGATIVE  NEGATIVE Final  . Bilirubin Urine 06/06/2015 NEGATIVE  NEGATIVE Final  . Ketones, ur 06/06/2015 NEGATIVE  NEGATIVE mg/dL Final  . Protein, ur 06/06/2015 NEGATIVE  NEGATIVE mg/dL Final  . Urobilinogen, UA 06/06/2015 1.0  0.0 - 1.0 mg/dL Final  . Nitrite 06/06/2015 NEGATIVE  NEGATIVE Final  . Leukocytes, UA 06/06/2015 NEGATIVE  NEGATIVE Final   MICROSCOPIC NOT DONE ON URINES WITH NEGATIVE PROTEIN, BLOOD, LEUKOCYTES, NITRITE, OR GLUCOSE <1000 mg/dL.  Marland Kitchen Cortisol, Plasma 06/06/2015 21.7   Final   Comment: (NOTE) AM    6.7 - 22.6 ug/dL PM   <10.0       ug/dL Performed at [facility]   . WBC 06/07/2015 10.9* 4.0 - 10.5 K/uL Final  . RBC 06/07/2015 3.26* 3.87 - 5.11 MIL/uL Final  . Hemoglobin 06/07/2015 8.0* 12.0 - 15.0 g/dL Final  . HCT 06/07/2015 23.2* 36.0 - 46.0 % Final  . MCV 06/07/2015 71.2* 78.0 - 100.0 fL Final  . MCH 06/07/2015 24.5* 26.0 - 34.0  pg Final  . MCHC 06/07/2015 34.5  30.0 - 36.0 g/dL Final  . RDW 06/07/2015 18.7* 11.5 - 15.5 % Final  . Platelets 06/07/2015 288  150 - 400 K/uL Final  . Sodium 06/06/2015 123* 135 - 145 mmol/L Final  . Potassium 06/06/2015 4.4  3.5 - 5.1 mmol/L Final  . Chloride 06/06/2015 91* 101 - 111 mmol/L Final  . CO2 06/06/2015 22  22 - 32 mmol/L Final  . Glucose, Bld  06/06/2015 96  65 - 99 mg/dL Final  . BUN 06/06/2015 12  6 - 20 mg/dL Final  . Creatinine, Ser 06/06/2015 0.47  0.44 - 1.00 mg/dL Final  . Calcium 06/06/2015 8.0* 8.9 - 10.3 mg/dL Final  . GFR calc non Af Amer 06/06/2015 >60  >60 mL/min Final  . GFR calc Af Amer 06/06/2015 >60  >60 mL/min Final   Comment: (NOTE) The eGFR has been calculated using the CKD EPI equation. This calculation has not been validated in all clinical situations. eGFR's persistently <60 mL/min signify possible Chronic Kidney Disease.   . Anion gap 06/06/2015 10  5 - 15 Final  . Sodium 06/07/2015 124* 135 - 145 mmol/L Final  . Potassium 06/07/2015 4.2  3.5 - 5.1 mmol/L Final  . Chloride 06/07/2015 92* 101 - 111 mmol/L Final  . CO2 06/07/2015 22  22 - 32 mmol/L Final  . Glucose, Bld 06/07/2015 99  65 - 99 mg/dL Final  . BUN 06/07/2015 12  6 - 20 mg/dL Final  . Creatinine, Ser 06/07/2015 0.41* 0.44 - 1.00 mg/dL Final  . Calcium 06/07/2015 7.8* 8.9 - 10.3 mg/dL Final  . GFR calc non Af Amer 06/07/2015 >60  >60 mL/min Final  . GFR calc Af Amer 06/07/2015 >60  >60 mL/min Final   Comment: (NOTE) The eGFR has been calculated using the CKD EPI equation. This calculation has not been validated in all clinical situations. eGFR's persistently <60 mL/min signify possible Chronic Kidney Disease.   . Anion gap 06/07/2015 10  5 - 15 Final  . Sodium 06/07/2015 126* 135 - 145 mmol/L Final  . Potassium 06/07/2015 4.3  3.5 - 5.1 mmol/L Final  . Chloride 06/07/2015 95* 101 - 111 mmol/L Final  . CO2 06/07/2015 22  22 - 32 mmol/L Final  . Glucose, Bld 06/07/2015 88  65 - 99 mg/dL Final  . BUN 06/07/2015 12  6 - 20 mg/dL Final  . Creatinine, Ser 06/07/2015 0.41* 0.44 - 1.00 mg/dL Final  . Calcium 06/07/2015 7.8* 8.9 - 10.3 mg/dL Final  . GFR calc non Af Amer 06/07/2015 >60  >60 mL/min Final  . GFR calc Af Amer 06/07/2015 >60  >60 mL/min Final   Comment: (NOTE) The eGFR has been calculated using the CKD EPI equation. This  calculation has not been validated in all clinical situations. eGFR's persistently <60 mL/min signify possible Chronic Kidney Disease.   . Anion gap 06/07/2015 9  5 - 15 Final  . Sodium 06/07/2015 128* 135 - 145 mmol/L Final  . Potassium 06/07/2015 4.1  3.5 - 5.1 mmol/L Final  . Chloride 06/07/2015 96* 101 - 111 mmol/L Final  . CO2 06/07/2015 22  22 - 32 mmol/L Final  . Glucose, Bld 06/07/2015 105* 65 - 99 mg/dL Final  . BUN 06/07/2015 11  6 - 20 mg/dL Final  . Creatinine, Ser 06/07/2015 0.50  0.44 - 1.00 mg/dL Final  . Calcium 06/07/2015 8.0* 8.9 - 10.3 mg/dL Final  . GFR calc non Af Amer 06/07/2015 >60  >  60 mL/min Final  . GFR calc Af Amer 06/07/2015 >60  >60 mL/min Final   Comment: (NOTE) The eGFR has been calculated using the CKD EPI equation. This calculation has not been validated in all clinical situations. eGFR's persistently <60 mL/min signify possible Chronic Kidney Disease.   . Anion gap 06/07/2015 10  5 - 15 Final  . Sodium 06/07/2015 129* 135 - 145 mmol/L Final  . Potassium 06/07/2015 3.9  3.5 - 5.1 mmol/L Final  . Chloride 06/07/2015 96* 101 - 111 mmol/L Final  . CO2 06/07/2015 25  22 - 32 mmol/L Final  . Glucose, Bld 06/07/2015 92  65 - 99 mg/dL Final  . BUN 06/07/2015 11  6 - 20 mg/dL Final  . Creatinine, Ser 06/07/2015 0.44  0.44 - 1.00 mg/dL Final  . Calcium 06/07/2015 7.8* 8.9 - 10.3 mg/dL Final  . GFR calc non Af Amer 06/07/2015 >60  >60 mL/min Final  . GFR calc Af Amer 06/07/2015 >60  >60 mL/min Final   Comment: (NOTE) The eGFR has been calculated using the CKD EPI equation. This calculation has not been validated in all clinical situations. eGFR's persistently <60 mL/min signify possible Chronic Kidney Disease.   . Anion gap 06/07/2015 8  5 - 15 Final  . Lipase 06/07/2015 27  22 - 51 U/L Final  . Amylase 06/07/2015 37  28 - 100 U/L Final  . WBC 06/08/2015 8.7  4.0 - 10.5 K/uL Final  . RBC 06/08/2015 3.04* 3.87 - 5.11 MIL/uL Final  . Hemoglobin  06/08/2015 7.3* 12.0 - 15.0 g/dL Final  . HCT 06/08/2015 21.6* 36.0 - 46.0 % Final  . MCV 06/08/2015 71.1* 78.0 - 100.0 fL Final  . MCH 06/08/2015 24.0* 26.0 - 34.0 pg Final  . MCHC 06/08/2015 33.8  30.0 - 36.0 g/dL Final  . RDW 06/08/2015 18.7* 11.5 - 15.5 % Final  . Platelets 06/08/2015 267  150 - 400 K/uL Final  . Sodium 06/08/2015 131* 135 - 145 mmol/L Final  . Potassium 06/08/2015 3.8  3.5 - 5.1 mmol/L Final  . Chloride 06/08/2015 98* 101 - 111 mmol/L Final  . CO2 06/08/2015 26  22 - 32 mmol/L Final  . Glucose, Bld 06/08/2015 84  65 - 99 mg/dL Final  . BUN 06/08/2015 11  6 - 20 mg/dL Final  . Creatinine, Ser 06/08/2015 0.45  0.44 - 1.00 mg/dL Final  . Calcium 06/08/2015 7.8* 8.9 - 10.3 mg/dL Final  . Total Protein 06/08/2015 5.0* 6.5 - 8.1 g/dL Final  . Albumin 06/08/2015 2.1* 3.5 - 5.0 g/dL Final  . AST 06/08/2015 21  15 - 41 U/L Final  . ALT 06/08/2015 17  14 - 54 U/L Final  . Alkaline Phosphatase 06/08/2015 62  38 - 126 U/L Final  . Total Bilirubin 06/08/2015 0.7  0.3 - 1.2 mg/dL Final  . GFR calc non Af Amer 06/08/2015 >60  >60 mL/min Final  . GFR calc Af Amer 06/08/2015 >60  >60 mL/min Final   Comment: (NOTE) The eGFR has been calculated using the CKD EPI equation. This calculation has not been validated in all clinical situations. eGFR's persistently <60 mL/min signify possible Chronic Kidney Disease.   . Anion gap 06/08/2015 7  5 - 15 Final  . Magnesium 06/08/2015 2.0  1.7 - 2.4 mg/dL Final  . ABO/RH(D) 06/08/2015 O POS   Final  . Antibody Screen 06/08/2015 NEG   Final  . Sample Expiration 06/08/2015 06/11/2015   Final  . Unit Number 06/08/2015  [id number]   Final  . Blood Component Type 06/08/2015 RED CELLS,LR   Final  . Unit division 06/08/2015 00   Final  . Status of Unit 06/08/2015 ISSUED,FINAL   Final  . Transfusion Status 06/08/2015 OK TO TRANSFUSE   Final  . Crossmatch Result 06/08/2015 Compatible   Final  . Order Confirmation 06/08/2015 ORDER PROCESSED BY  BLOOD BANK   Final  . WBC 06/09/2015 9.1  4.0 - 10.5 K/uL Final  . RBC 06/09/2015 3.98  3.87 - 5.11 MIL/uL Final  . Hemoglobin 06/09/2015 9.8* 12.0 - 15.0 g/dL Final   Comment: DELTA CHECK NOTED POST TRANSFUSION SPECIMEN   . HCT 06/09/2015 28.9* 36.0 - 46.0 % Final  . MCV 06/09/2015 72.6* 78.0 - 100.0 fL Final  . MCH 06/09/2015 24.6* 26.0 - 34.0 pg Final  . MCHC 06/09/2015 33.9  30.0 - 36.0 g/dL Final  . RDW 06/09/2015 19.1* 11.5 - 15.5 % Final  . Platelets 06/09/2015 278  150 - 400 K/uL Final  . Sodium 06/09/2015 132* 135 - 145 mmol/L Final  . Potassium 06/09/2015 4.1  3.5 - 5.1 mmol/L Final  . Chloride 06/09/2015 100* 101 - 111 mmol/L Final  . CO2 06/09/2015 24  22 - 32 mmol/L Final  . Glucose, Bld 06/09/2015 108* 65 - 99 mg/dL Final  . BUN 06/09/2015 15  6 - 20 mg/dL Final  . Creatinine, Ser 06/09/2015 0.46  0.44 - 1.00 mg/dL Final  . Calcium 06/09/2015 8.0* 8.9 - 10.3 mg/dL Final  . GFR calc non Af Amer 06/09/2015 >60  >60 mL/min Final  . GFR calc Af Amer 06/09/2015 >60  >60 mL/min Final   Comment: (NOTE) The eGFR has been calculated using the CKD EPI equation. This calculation has not been validated in all clinical situations. eGFR's persistently <60 mL/min signify possible Chronic Kidney Disease.   . Anion gap 06/09/2015 8  5 - 15 Final  . Glucose-Capillary 06/08/2015 100* 65 - 99 mg/dL Final  . Comment 1 06/08/2015 Notify RN   Final  . Comment 2 06/08/2015 Document in Chart   Final  Appointment on 06/06/2015  Component Date Value Ref Range Status  . TSH 06/06/2015 6.868* 0.308 - 3.960 m(IU)/L Final  . WBC 06/06/2015 11.3* 3.9 - 10.3 10e3/uL Final  . NEUT# 06/06/2015 9.9* 1.5 - 6.5 10e3/uL Final  . HGB 06/06/2015 8.7* 11.6 - 15.9 g/dL Final  . HCT 06/06/2015 26.2* 34.8 - 46.6 % Final  . Platelets 06/06/2015 422* 145 - 400 10e3/uL Final  . MCV 06/06/2015 72.2* 79.5 - 101.0 fL Final  . MCH 06/06/2015 24.1* 25.1 - 34.0 pg Final  . MCHC 06/06/2015 33.4  31.5 - 36.0 g/dL  Final  . RBC 06/06/2015 3.62* 3.70 - 5.45 10e6/uL Final  . RDW 06/06/2015 20.8* 11.2 - 14.5 % Final  . lymph# 06/06/2015 0.2* 0.9 - 3.3 10e3/uL Final  . MONO# 06/06/2015 1.1* 0.1 - 0.9 10e3/uL Final  . Eosinophils Absolute 06/06/2015 0.0  0.0 - 0.5 10e3/uL Final  . Basophils Absolute 06/06/2015 0.0  0.0 - 0.1 10e3/uL Final  . NEUT% 06/06/2015 87.8* 38.4 - 76.8 % Final  . LYMPH% 06/06/2015 1.6* 14.0 - 49.7 % Final  . MONO% 06/06/2015 9.9  0.0 - 14.0 % Final  . EOS% 06/06/2015 0.4  0.0 - 7.0 % Final  . BASO% 06/06/2015 0.3  0.0 - 2.0 % Final  . Sodium 06/06/2015 123* 136 - 145 mEq/L Final  . Potassium 06/06/2015 4.7  3.5 - 5.1 mEq/L Final  .  Chloride 06/06/2015 91* 98 - 109 mEq/L Final  . CO2 06/06/2015 23  22 - 29 mEq/L Final  . Glucose 06/06/2015 125  70 - 140 mg/dl Final  . BUN 06/06/2015 13.6  7.0 - 26.0 mg/dL Final  . Creatinine 06/06/2015 0.6  0.6 - 1.1 mg/dL Final  . Total Bilirubin 06/06/2015 0.44  0.20 - 1.20 mg/dL Final  . Alkaline Phosphatase 06/06/2015 104  40 - 150 U/L Final  . AST 06/06/2015 33  5 - 34 U/L Final  . ALT 06/06/2015 28  0 - 55 U/L Final  . Total Protein 06/06/2015 6.1* 6.4 - 8.3 g/dL Final  . Albumin 06/06/2015 2.1* 3.5 - 5.0 g/dL Final  . Calcium 06/06/2015 8.7  8.4 - 10.4 mg/dL Final  . Anion Gap 06/06/2015 9  3 - 11 mEq/L Final  . EGFR 06/06/2015 83* >90 ml/min/1.73 m2 Final   eGFR is calculated using the CKD-EPI Creatinine Equation (2009)  . Urine Culture, Routine 06/06/2015 Culture, Urine   Final   Comment: Final - ===== COLONY COUNT: ===== 75,000 COLONIES/ML ENTEROCOCCUS SPECIES  ------------------------------------------------------------------------  ENTEROCOCCUS SPECIES     AMPICILLIN                       MIC      Sensitive        <=2 ug/ml    LEVOFLOXACIN                     MIC      Sensitive          1 ug/ml    NITROFURANTOIN                   MIC      Sensitive       <=16 ug/ml    VANCOMYCIN                       MIC      Sensitive           1 ug/ml    TETRACYCLINE                     MIC      Resistant       >=16 ug/ml END OF REPORT   . Glucose 06/06/2015 Negative  Negative mg/dL Final  . Bilirubin (Urine) 06/06/2015 Negative  Negative Final  . Ketones 06/06/2015 Negative  Negative mg/dL Final  . Specific Gravity, Urine 06/06/2015 1.010  1.003 - 1.035 Final  . Blood 06/06/2015 Negative  Negative Final  . pH 06/06/2015 5.0  4.6 - 8.0 Final  . Protein 06/06/2015 Negative  Negative- <30 mg/dL Final  . Urobilinogen, UR 06/06/2015 0.2  0.2 - 1 mg/dL Final  . Nitrite 06/06/2015 Negative  Negative Final  . Leukocyte Esterase 06/06/2015 Trace  Negative Final  . RBC / HPF 06/06/2015 0-2  0 - 2 Final  . WBC, UA 06/06/2015 0-2  0 - 2 Final  . Bacteria, UA 06/06/2015 Few  Negative- Trace Final  . Epithelial Cells 06/06/2015 Few  Negative- Few Final  . Magnesium 06/06/2015 1.7  1.5 - 2.5 mg/dl Final  Appointment on 05/24/2015  Component Date Value Ref Range Status  . WBC 05/24/2015 17.2* 3.9 - 10.3 10e3/uL Final  . NEUT# 05/24/2015 15.2* 1.5 - 6.5 10e3/uL Final  . HGB 05/24/2015 10.1* 11.6 - 15.9 g/dL Final  . HCT 05/24/2015 29.8* 34.8 - 46.6 %  Final  . Platelets 05/24/2015 501* 145 - 400 10e3/uL Final  . MCV 05/24/2015 74.9* 79.5 - 101.0 fL Final  . MCH 05/24/2015 25.4  25.1 - 34.0 pg Final  . MCHC 05/24/2015 33.9  31.5 - 36.0 g/dL Final  . RBC 05/24/2015 3.98  3.70 - 5.45 10e6/uL Final  . RDW 05/24/2015 16.6* 11.2 - 14.5 % Final  . lymph# 05/24/2015 0.4* 0.9 - 3.3 10e3/uL Final  . MONO# 05/24/2015 1.5* 0.1 - 0.9 10e3/uL Final  . Eosinophils Absolute 05/24/2015 0.0  0.0 - 0.5 10e3/uL Final  . Basophils Absolute 05/24/2015 0.0  0.0 - 0.1 10e3/uL Final  . NEUT% 05/24/2015 88.3* 38.4 - 76.8 % Final  . LYMPH% 05/24/2015 2.6* 14.0 - 49.7 % Final  . MONO% 05/24/2015 8.9  0.0 - 14.0 % Final  . EOS% 05/24/2015 0.1  0.0 - 7.0 % Final  . BASO% 05/24/2015 0.1  0.0 - 2.0 % Final  . Retic % 05/24/2015 1.96  0.70 - 2.10 % Final  .  Retic Ct Abs 05/24/2015 78.01  33.70 - 90.70 10e3/uL Final  . Immature Retic Fract 05/24/2015 7.90  1.60 - 10.00 % Final  . Sodium 05/24/2015 134* 136 - 145 mEq/L Final  . Potassium 05/24/2015 5.1  3.5 - 5.1 mEq/L Final  . Chloride 05/24/2015 98  98 - 109 mEq/L Final  . CO2 05/24/2015 26  22 - 29 mEq/L Final  . Glucose 05/24/2015 163* 70 - 140 mg/dl Final  . BUN 05/24/2015 16.3  7.0 - 26.0 mg/dL Final  . Creatinine 05/24/2015 0.7  0.6 - 1.1 mg/dL Final  . Total Bilirubin 05/24/2015 0.35  0.20 - 1.20 mg/dL Final  . Alkaline Phosphatase 05/24/2015 110  40 - 150 U/L Final  . AST 05/24/2015 13  5 - 34 U/L Final  . ALT 05/24/2015 18  0 - 55 U/L Final  . Total Protein 05/24/2015 6.8  6.4 - 8.3 g/dL Final  . Albumin 05/24/2015 2.5* 3.5 - 5.0 g/dL Final  . Calcium 05/24/2015 9.6  8.4 - 10.4 mg/dL Final  . Anion Gap 05/24/2015 10  3 - 11 mEq/L Final  . EGFR 05/24/2015 80* >90 ml/min/1.73 m2 Final   eGFR is calculated using the CKD-EPI Creatinine Equation (2009)  . TSH 05/24/2015 10.054* 0.308 - 3.960 m(IU)/L Final  . Free T4 05/24/2015 0.92  0.80 - 1.80 ng/dL Final  Hospital Outpatient Visit on 05/23/2015  Component Date Value Ref Range Status  . aPTT 05/23/2015 33  24 - 37 seconds Final  . WBC 05/23/2015 15.1* 4.0 - 10.5 K/uL Final  . RBC 05/23/2015 3.87  3.87 - 5.11 MIL/uL Final  . Hemoglobin 05/23/2015 9.9* 12.0 - 15.0 g/dL Final  . HCT 05/23/2015 28.9* 36.0 - 46.0 % Final  . MCV 05/23/2015 74.7* 78.0 - 100.0 fL Final  . MCH 05/23/2015 25.6* 26.0 - 34.0 pg Final  . MCHC 05/23/2015 34.3  30.0 - 36.0 g/dL Final  . RDW 05/23/2015 16.3* 11.5 - 15.5 % Final  . Platelets 05/23/2015 549* 150 - 400 K/uL Final  . Prothrombin Time 05/23/2015 15.5* 11.6 - 15.2 seconds Final  . INR 05/23/2015 1.22  0.00 - 1.49 Final  Hospital Outpatient Visit on 05/22/2015  Component Date Value Ref Range Status  . Order Confirmation 05/22/2015 ORDER PROCESSED BY BLOOD BANK   Final  . ABO/RH(D) 05/22/2015 O POS    Final  . Antibody Screen 05/22/2015 NEG   Final  . Sample Expiration 05/22/2015 05/25/2015   Final  .  Unit Number 05/22/2015 [id number]   Final  . Blood Component Type 05/22/2015 RED CELLS,LR   Final  . Unit division 05/22/2015 00   Final  . Status of Unit 05/22/2015 ISSUED,FINAL   Final  . Transfusion Status 05/22/2015 OK TO TRANSFUSE   Final  . Crossmatch Result 05/22/2015 Compatible   Final  . ABO/RH(D) 05/22/2015 O POS   Final  Appointment on 05/18/2015  Component Date Value Ref Range Status  . WBC 05/18/2015 10.2  3.9 - 10.3 10e3/uL Final  . NEUT# 05/18/2015 8.9* 1.5 - 6.5 10e3/uL Final  . HGB 05/18/2015 8.1* 11.6 - 15.9 g/dL Final  . HCT 05/18/2015 23.7* 34.8 - 46.6 % Final  . Platelets 05/18/2015 613* 145 - 400 10e3/uL Final  . MCV 05/18/2015 72.3* 79.5 - 101.0 fL Final  . MCH 05/18/2015 24.7* 25.1 - 34.0 pg Final  . MCHC 05/18/2015 34.2  31.5 - 36.0 g/dL Final  . RBC 05/18/2015 3.28* 3.70 - 5.45 10e6/uL Final  . RDW 05/18/2015 15.2* 11.2 - 14.5 % Final  . lymph# 05/18/2015 0.4* 0.9 - 3.3 10e3/uL Final  . MONO# 05/18/2015 0.7  0.1 - 0.9 10e3/uL Final  . Eosinophils Absolute 05/18/2015 0.2  0.0 - 0.5 10e3/uL Final  . Basophils Absolute 05/18/2015 0.0  0.0 - 0.1 10e3/uL Final  . NEUT% 05/18/2015 86.7* 38.4 - 76.8 % Final  . LYMPH% 05/18/2015 4.1* 14.0 - 49.7 % Final  . MONO% 05/18/2015 6.8  0.0 - 14.0 % Final  . EOS% 05/18/2015 2.2  0.0 - 7.0 % Final  . BASO% 05/18/2015 0.2  0.0 - 2.0 % Final  . nRBC 05/18/2015 0  0 - 0 % Final  . Retic % 05/18/2015 2.06  0.70 - 2.10 % Final  . Retic Ct Abs 05/18/2015 67.57  33.70 - 90.70 10e3/uL Final  . Immature Retic Fract 05/18/2015 14.70* 1.60 - 10.00 % Final  . Sodium 05/18/2015 130* 136 - 145 mEq/L Final  . Potassium 05/18/2015 4.6  3.5 - 5.1 mEq/L Final  . Chloride 05/18/2015 97* 98 - 109 mEq/L Final  . CO2 05/18/2015 23  22 - 29 mEq/L Final  . Glucose 05/18/2015 173* 70 - 140 mg/dl Final  . BUN 05/18/2015 9.7  7.0 - 26.0 mg/dL  Final  . Creatinine 05/18/2015 0.7  0.6 - 1.1 mg/dL Final  . Total Bilirubin 05/18/2015 0.43  0.20 - 1.20 mg/dL Final  . Alkaline Phosphatase 05/18/2015 96  40 - 150 U/L Final  . AST 05/18/2015 15  5 - 34 U/L Final  . ALT 05/18/2015 22  0 - 55 U/L Final  . Total Protein 05/18/2015 6.8  6.4 - 8.3 g/dL Final  . Albumin 05/18/2015 2.6* 3.5 - 5.0 g/dL Final  . Calcium 05/18/2015 9.6  8.4 - 10.4 mg/dL Final  . Anion Gap 05/18/2015 11  3 - 11 mEq/L Final  . EGFR 05/18/2015 80* >90 ml/min/1.73 m2 Final   eGFR is calculated using the CKD-EPI Creatinine Equation (2009)  . Guardant 360 05/18/2015 See scanned report in EPIC media.   Final  . IgG (Immunoglobin G), Serum 05/18/2015 1050  690 - 1700 mg/dL Final  . IgA 05/18/2015 185  69 - 380 mg/dL Final  . IgM, Serum 05/18/2015 157  52 - 322 mg/dL Final  . Immunofix Electr Int 05/18/2015 *   Final   No monoclonal protein identified.Reviewed by Odis Hollingshead, MD, PhD, FCAP (Electronic Signature onFile)  . Total Protein, Serum Electrophores* 05/18/2015 6.2  6.1 - 8.1  g/dL Final  . Albumin ELP 05/18/2015 2.5* 3.8 - 4.8 g/dL Final  . Alpha-1-Globulin 05/18/2015 0.8* 0.2 - 0.3 g/dL Final  . Alpha-2-Globulin 05/18/2015 1.2* 0.5 - 0.9 g/dL Final  . Beta Globulin 05/18/2015 0.5  0.4 - 0.6 g/dL Final  . Beta 2 05/18/2015 0.3  0.2 - 0.5 g/dL Final  . Gamma Globulin 05/18/2015 1.0  0.8 - 1.7 g/dL Final  . Abnormal Protein Band1 05/18/2015 NOT DET   Final  . SPE Interp. 05/18/2015 *   Final   The possibility of a faint restricted band(s) cannot be completelyexcluded in the BETA-1 region.Reviewed by Odis Hollingshead, MD, PhD, FCAP (Electronic Signature onFile)  . COMMENT (PROTEIN ELECTROPHOR) 05/18/2015 *   Final   Comment: ---------------Serum protein electrophoresis is a useful screening procedure in thedetection of various pathophysiologic states such as inflammation,gammopathies, protein loss and other dysproteinemias.  Immunofixationelectrophoresis  (IFE) is a more  sensitive technique for theidentification of M-proteins found in patients with monoclonalgammopathy of unknown significance (MGUS), amyloidosis, early ortreated myeloma or macroglobulinemia, solitary plasmacytoma orextramedullary plasmacytoma.   . Abnormal Protein Band2 05/18/2015 NOT DET   Final  . Abnormal Protein Band3 05/18/2015 NOT DET   Final  . Cortisol, Plasma 05/18/2015 20.6   Final    AM:  4.3 - 22.4 ug/dLPM:  3.1 - 16.7 ug/dL  . Ferritin 05/18/2015 172  9 - 269 ng/ml Final  . Iron 05/18/2015 13* 41 - 142 ug/dL Final  . TIBC 05/18/2015 229* 236 - 444 ug/dL Final  . UIBC 05/18/2015 216  120 - 384 ug/dL Final  . %SAT 05/18/2015 6* 21 - 57 % Final  Hospital Outpatient Visit on 05/08/2015  Component Date Value Ref Range Status  . aPTT 05/08/2015 36  24 - 37 seconds Final  . WBC 05/08/2015 10.6* 4.0 - 10.5 K/uL Final  . RBC 05/08/2015 3.26* 3.87 - 5.11 MIL/uL Final  . Hemoglobin 05/08/2015 8.3* 12.0 - 15.0 g/dL Final  . HCT 05/08/2015 24.6* 36.0 - 46.0 % Final  . MCV 05/08/2015 75.5* 78.0 - 100.0 fL Final  . MCH 05/08/2015 25.5* 26.0 - 34.0 pg Final  . MCHC 05/08/2015 33.7  30.0 - 36.0 g/dL Final  . RDW 05/08/2015 14.4  11.5 - 15.5 % Final  . Platelets 05/08/2015 560* 150 - 400 K/uL Final  . Prothrombin Time 05/08/2015 15.0  11.6 - 15.2 seconds Final  . INR 05/08/2015 1.16  0.00 - 1.49 Final  Appointment on 05/04/2015  Component Date Value Ref Range Status  . WBC 05/04/2015 12.8* 3.9 - 10.3 10e3/uL Final  . NEUT# 05/04/2015 11.3* 1.5 - 6.5 10e3/uL Final  . HGB 05/04/2015 9.2* 11.6 - 15.9 g/dL Final  . HCT 05/04/2015 26.6* 34.8 - 46.6 % Final  . Platelets 05/04/2015 555* 145 - 400 10e3/uL Final  . MCV 05/04/2015 74.9* 79.5 - 101.0 fL Final  . MCH 05/04/2015 25.9  25.1 - 34.0 pg Final  . MCHC 05/04/2015 34.6  31.5 - 36.0 g/dL Final  . RBC 05/04/2015 3.55* 3.70 - 5.45 10e6/uL Final  . RDW 05/04/2015 14.0  11.2 - 14.5 % Final  . lymph# 05/04/2015 0.5* 0.9 - 3.3  10e3/uL Final  . MONO# 05/04/2015 0.6  0.1 - 0.9 10e3/uL Final  . Eosinophils Absolute 05/04/2015 0.3  0.0 - 0.5 10e3/uL Final  . Basophils Absolute 05/04/2015 0.0  0.0 - 0.1 10e3/uL Final  . NEUT% 05/04/2015 88.3* 38.4 - 76.8 % Final  . LYMPH% 05/04/2015 4.1* 14.0 - 49.7 % Final  .  MONO% 05/04/2015 4.9  0.0 - 14.0 % Final  . EOS% 05/04/2015 2.6  0.0 - 7.0 % Final  . BASO% 05/04/2015 0.1  0.0 - 2.0 % Final  . Retic % 05/04/2015 1.94  0.70 - 2.10 % Final  . Retic Ct Abs 05/04/2015 68.87  33.70 - 90.70 10e3/uL Final  . Immature Retic Fract 05/04/2015 14.60* 1.60 - 10.00 % Final  . Sodium 05/04/2015 131* 136 - 145 mEq/L Final  . Potassium 05/04/2015 3.9  3.5 - 5.1 mEq/L Final  . Chloride 05/04/2015 96* 98 - 109 mEq/L Final  . CO2 05/04/2015 24  22 - 29 mEq/L Final  . Glucose 05/04/2015 175* 70 - 140 mg/dl Final  . BUN 05/04/2015 13.4  7.0 - 26.0 mg/dL Final  . Creatinine 05/04/2015 0.7  0.6 - 1.1 mg/dL Final  . Total Bilirubin 05/04/2015 0.59  0.20 - 1.20 mg/dL Final  . Alkaline Phosphatase 05/04/2015 96  40 - 150 U/L Final  . AST 05/04/2015 10  5 - 34 U/L Final  . ALT 05/04/2015 15  0 - 55 U/L Final  . Total Protein 05/04/2015 7.5  6.4 - 8.3 g/dL Final  . Albumin 05/04/2015 3.1* 3.5 - 5.0 g/dL Final  . Calcium 05/04/2015 9.7  8.4 - 10.4 mg/dL Final  . Anion Gap 05/04/2015 11  3 - 11 mEq/L Final  . EGFR 05/04/2015 79* >90 ml/min/1.73 m2 Final   eGFR is calculated using the CKD-EPI Creatinine Equation (2009)  . LDH 05/04/2015 186  125 - 245 U/L Final  . Hold Tube, Blood Bank 05/04/2015 Blood Bank Order [facility] Outpatient Visit on 05/04/2015  Component Date Value Ref Range Status  . Glucose-Capillary 05/04/2015 148* 65 - 99 mg/dL Final  Appointment on 04/21/2015  Component Date Value Ref Range Status  . CEA 04/21/2015 3.6  0.0 - 5.0 ng/mL Final  . CA 19-9 04/21/2015 247.6* <35.0 U/mL Final  There may be more visits with results that are not included.  No results  found.   Assessment/Plan   ICD-9-CM ICD-10-CM   1. Metastatic lung cancer (metastasis from lung to other site), unspecified laterality (HCC) 162.9 C34.90    199.1 C79.9    to adrenal, pancreas, pelvic lymph node, anterior abdominal wall, paraspinal soft tissue on chemotx and XRT  2. Hypoalbuminemia 273.8 E88.09   3. Anemia of chronic disease 285.29 D63.8   4. Hyponatremia 276.1 E87.1   5. Gastroesophageal reflux disease, esophagitis presence not specified 530.81 K21.9     Fall precautions  Bed alarm  wanderguard as ordered  Increase 2 cal to QID  Cont current meds as ordered  Repeat BMP pending  PT/OT/ST as ordered  Keep appt with specialists as scheduled - including Rad Onc XRT  GOAL: short term rehab and d/c home when medically appropriate. Communicated with pt and nursing.  Will follow  Dolton Shaker S. Perlie Gold  [facility] [address] [place],  [address] [phone] Cell (Monday-Friday 8 AM - 5 PM) [phone] After 5 PM and follow prompts

## 2016-05-26 IMAGING — CT CT CHEST W/O CM
2 of 4 series · 15 of 36 positions shown, 18 images · non-contrast
Comparison: CT of the chest dated 06/06/2015

CLINICAL DATA: New onset of breathing difficulties in a patient
being treated for a lung cancer with chemotherapy. History of colon
cancer in 8669.

EXAM:
CT CHEST WITHOUT CONTRAST
TECHNIQUE: Multidetector CT imaging of the chest was performed following the
standard protocol without IV contrast.

[Series 2: chest w/o st · axial · non-contrast · 0.63mm/px · z∈[-310,-40]mm · 12 of 64 slices shown, 15 images]
[im 5/64  mediastinal]
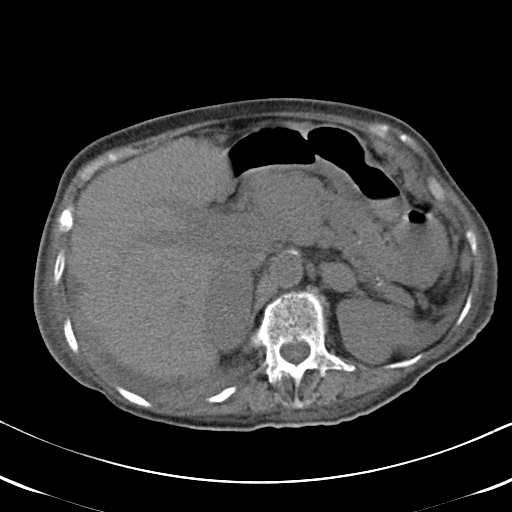
[im 5/64  lung]
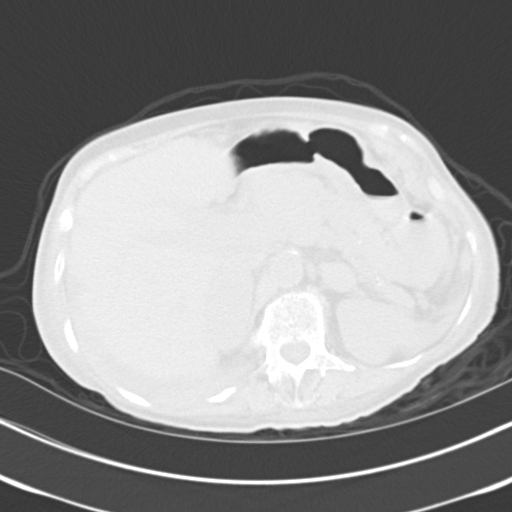
[im 10/64  lung]
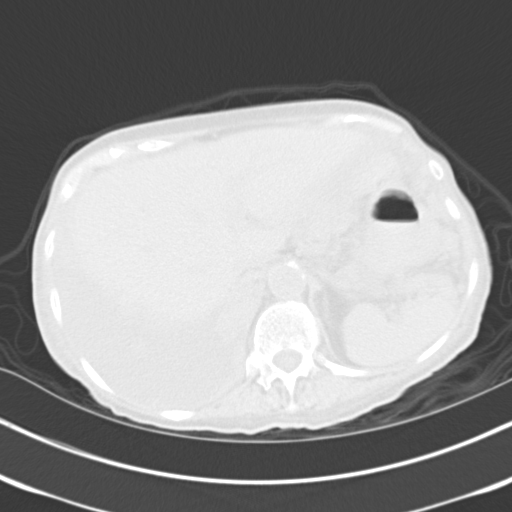
[im 15/64  lung]
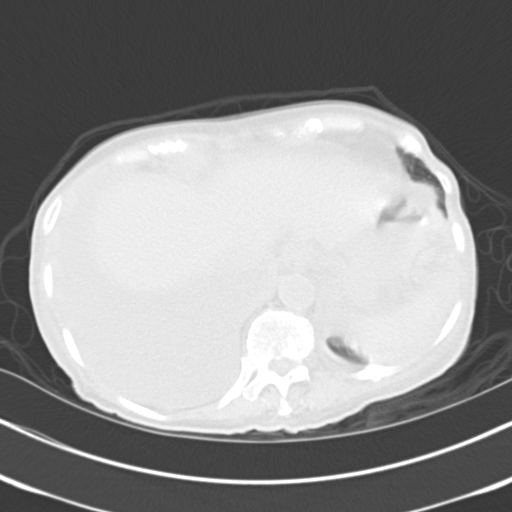
[im 20/64  lung]
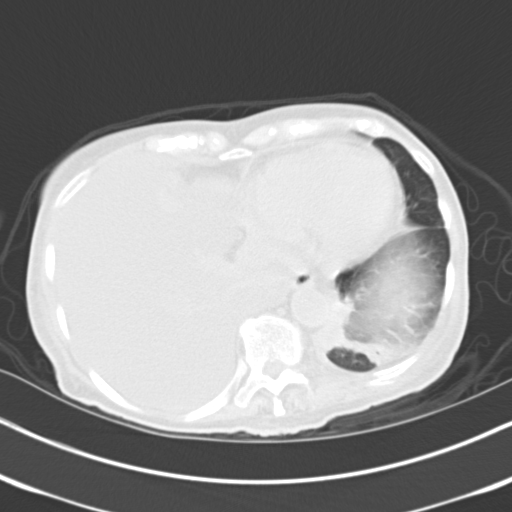
[im 25/64  mediastinal]
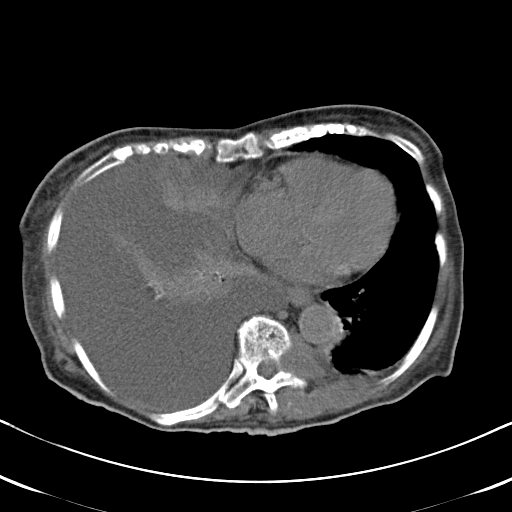
[im 25/64  lung]
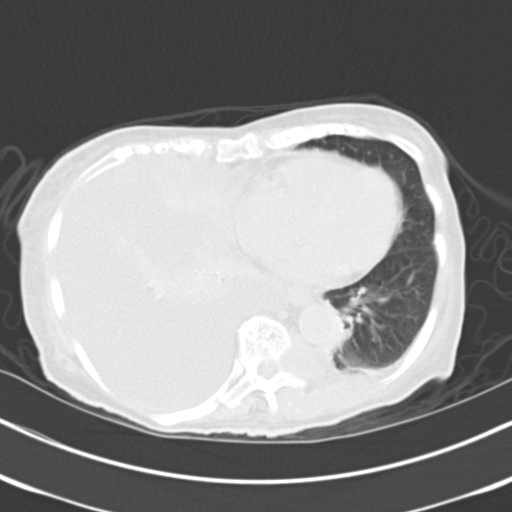
[im 30/64  lung]
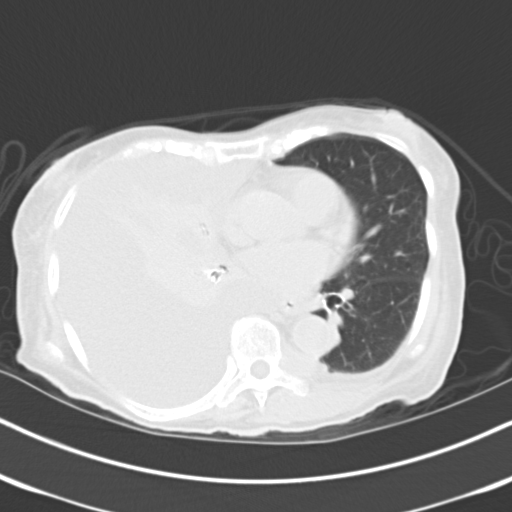
[im 34/64  lung]
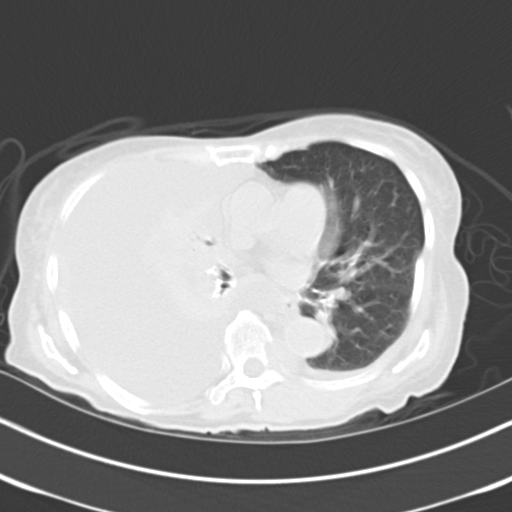
[im 39/64  lung]
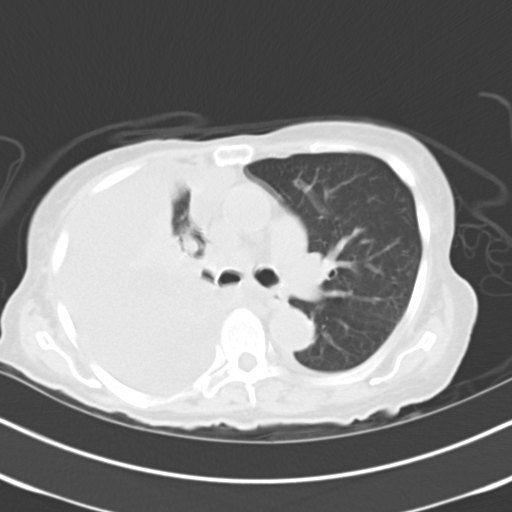
[im 44/64  mediastinal]
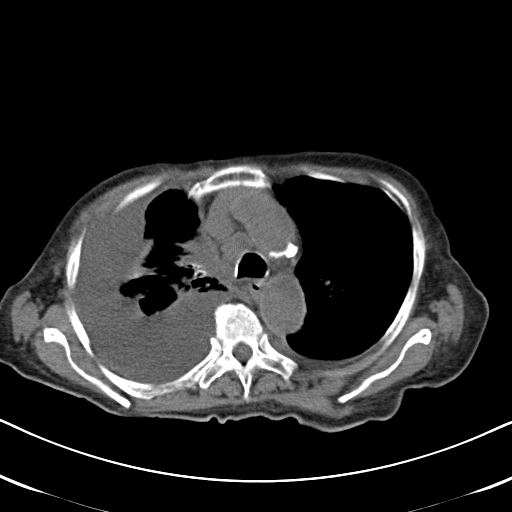
[im 44/64  lung]
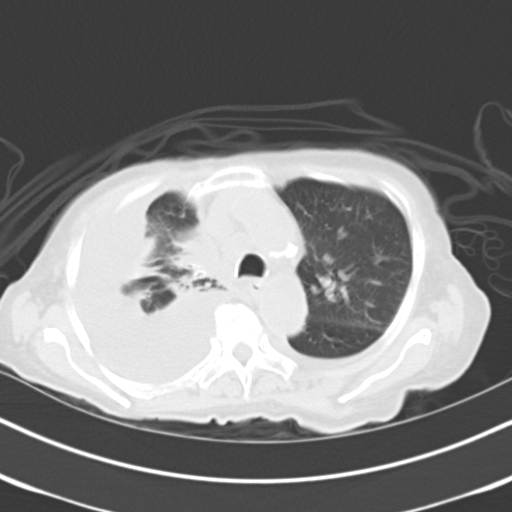
[im 49/64  lung]
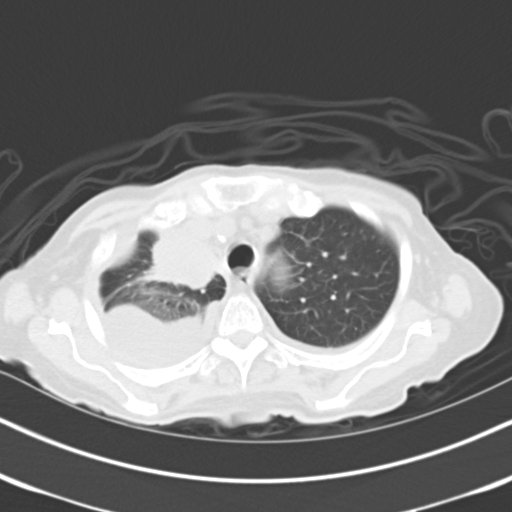
[im 54/64  lung]
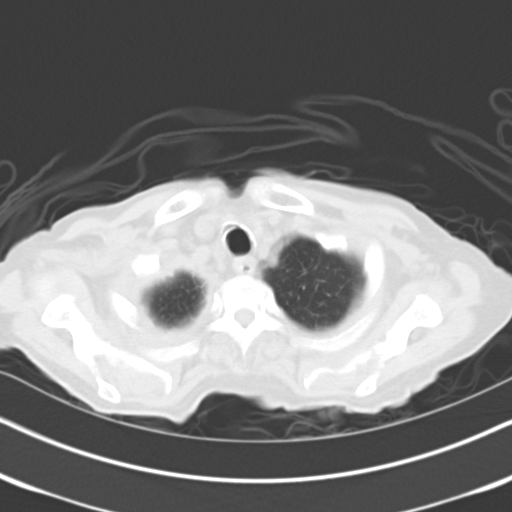
[im 59/64  lung]
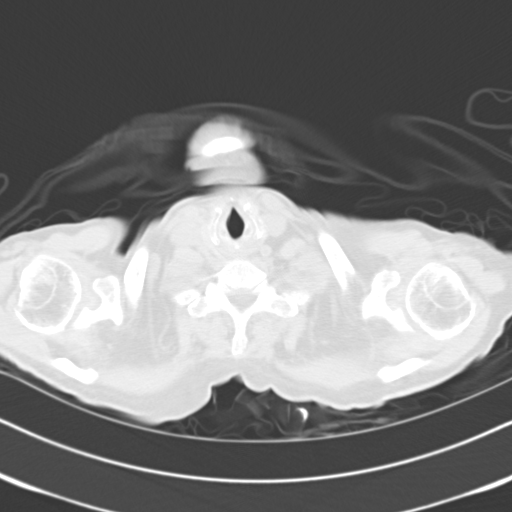

[Series 602: <mpr thick range> · coronal · 0.63mm/px · 3 of 77 slices shown]
[im 16/77  lung]
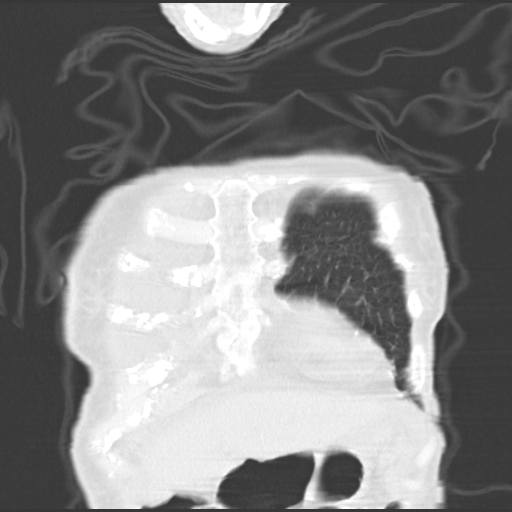
[im 31/77  lung]
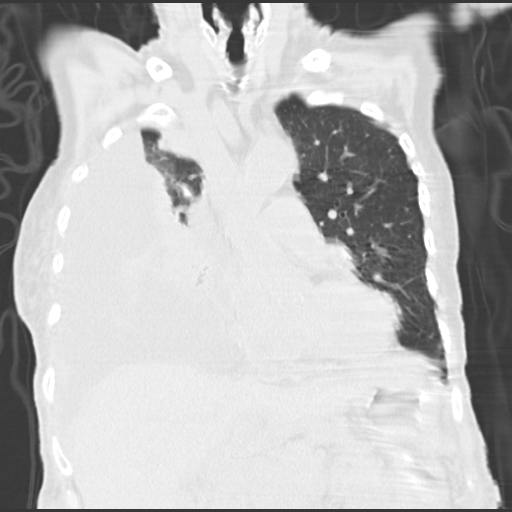
[im 46/77  lung]
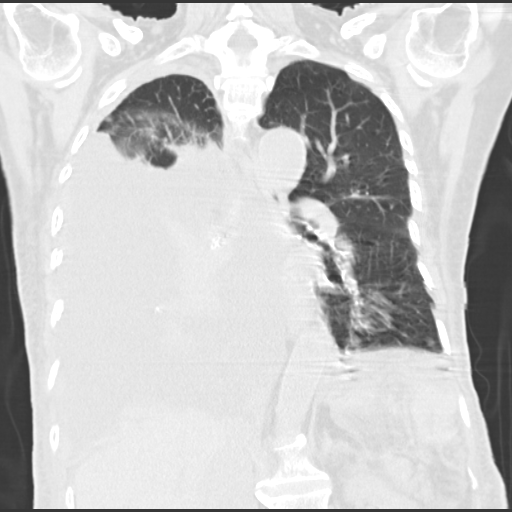

[15 of 36 positions shown; findings below may reference images not displayed]

FINDINGS: Again seen is a right upper lobe pulmonary mass, with extension to
the right mediastinum, and associated mediastinal lymphadenopathy.
There has been interval development of large loculated right pleural
effusion with severe compressive atelectasis of the remaining of the
right lung parenchyma. The mediastinal structures are mildly
displaced to the left. There is a small left pleural effusion with
subsegmental atelectasis. There is an 8 mm soft tissue nodule in the
inferior left lower lobe, in peridiaphragmatic location. Thoracic
aorta is normal in caliber. There is a mild pericardial thickening.

Airways are patent.

Visualized upper abdomen demonstrates interval enlargement of
bilateral adrenal masses, with the right adrenal mass, which is
partially visualized measuring 6.0 x 3.2 cm. Soft tissue nodule is
seen within the anterior mesentery, likely representing metastatic
nodule. There is also a 1.7 cm subcutaneous soft tissue nodule in
the lateral/anterior abdominal wall, likely also metastatic.

Previously-seen left paraspinal soft tissue masses in the thoracic
region, with neural foramina extension have enlarged and are causing
lytic destruction of the left-sided pedicles and vertebral bodies of
T7 and T9. There are healed to healing left posterior rib fractures.
IMPRESSION: Relatively stable in size right upper lobe pulmonary mass, with
interval development of large loculated right pleural effusion and
severe compressive atelectasis of the remaining of the right lung
parenchyma.

Mild leftward mediastinal shift.

Mild pericardial thickening.

Interval development of small left pleural effusion and an 8 mm.
peridiaphragmatic left lower lobe soft tissue nodule.

Interval enlargement of bilateral adrenal masses, with the right
adrenal mass, which is incompletely visualized, measuring up to 6
cm.

Anterior mesenteric metastatic nodule.

Right abdominal wall 1.7 cm metastatic nodule.

Interval enlargement of left perispinal soft tissue metastatic
masses, with osteolytic changes of the pedicles and left vertebral
bodies of T7 and T9.

These results will be called to the ordering clinician or
representative by the Radiologist Assistant, and communication
documented in the PACS or zVision Dashboard.
